# Patient Record
Sex: Male | Born: 1965 | Race: White | Hispanic: No | Smoking: Current some day smoker
Health system: Southern US, Community
[De-identification: ages and names within clinical notes are randomized; demographics above are authoritative.]

## PROBLEM LIST (undated history)

## (undated) DIAGNOSIS — K3189 Other diseases of stomach and duodenum: Secondary | ICD-10-CM

## (undated) DIAGNOSIS — R569 Unspecified convulsions: Secondary | ICD-10-CM

## (undated) DIAGNOSIS — R519 Headache, unspecified: Secondary | ICD-10-CM

## (undated) DIAGNOSIS — G43909 Migraine, unspecified, not intractable, without status migrainosus: Secondary | ICD-10-CM

## (undated) DIAGNOSIS — K766 Portal hypertension: Secondary | ICD-10-CM

## (undated) DIAGNOSIS — R51 Headache: Secondary | ICD-10-CM

## (undated) DIAGNOSIS — F419 Anxiety disorder, unspecified: Secondary | ICD-10-CM

## (undated) DIAGNOSIS — F32A Depression, unspecified: Secondary | ICD-10-CM

## (undated) DIAGNOSIS — K219 Gastro-esophageal reflux disease without esophagitis: Secondary | ICD-10-CM

## (undated) DIAGNOSIS — F329 Major depressive disorder, single episode, unspecified: Secondary | ICD-10-CM

## (undated) DIAGNOSIS — M1990 Unspecified osteoarthritis, unspecified site: Secondary | ICD-10-CM

## (undated) DIAGNOSIS — K7030 Alcoholic cirrhosis of liver without ascites: Secondary | ICD-10-CM

## (undated) DIAGNOSIS — F1010 Alcohol abuse, uncomplicated: Secondary | ICD-10-CM

## (undated) DIAGNOSIS — J439 Emphysema, unspecified: Secondary | ICD-10-CM

## (undated) DIAGNOSIS — J189 Pneumonia, unspecified organism: Secondary | ICD-10-CM

## (undated) DIAGNOSIS — J301 Allergic rhinitis due to pollen: Secondary | ICD-10-CM

## (undated) DIAGNOSIS — J449 Chronic obstructive pulmonary disease, unspecified: Secondary | ICD-10-CM

## (undated) DIAGNOSIS — K589 Irritable bowel syndrome without diarrhea: Secondary | ICD-10-CM

## (undated) DIAGNOSIS — F410 Panic disorder [episodic paroxysmal anxiety] without agoraphobia: Secondary | ICD-10-CM

## (undated) DIAGNOSIS — I639 Cerebral infarction, unspecified: Secondary | ICD-10-CM

## (undated) DIAGNOSIS — B1910 Unspecified viral hepatitis B without hepatic coma: Secondary | ICD-10-CM

## (undated) HISTORY — DX: Chronic obstructive pulmonary disease, unspecified: J44.9

## (undated) HISTORY — DX: Irritable bowel syndrome, unspecified: K58.9

## (undated) HISTORY — DX: Unspecified viral hepatitis B without hepatic coma: B19.10

## (undated) HISTORY — DX: Headache: R51

## (undated) HISTORY — DX: Headache, unspecified: R51.9

## (undated) HISTORY — DX: Portal hypertension: K76.6

## (undated) HISTORY — DX: Emphysema, unspecified: J43.9

## (undated) HISTORY — PX: LUMBAR DISC SURGERY: SHX700

## (undated) HISTORY — PX: ANTERIOR CERVICAL DECOMP/DISCECTOMY FUSION: SHX1161

## (undated) HISTORY — DX: Unspecified osteoarthritis, unspecified site: M19.90

## (undated) HISTORY — DX: Allergic rhinitis due to pollen: J30.1

## (undated) HISTORY — DX: Cerebral infarction, unspecified: I63.9

## (undated) HISTORY — DX: Other diseases of stomach and duodenum: K31.89

---

## 2006-09-22 ENCOUNTER — Ambulatory Visit (HOSPITAL_COMMUNITY): Admission: RE | Admit: 2006-09-22 | Discharge: 2006-09-23 | Payer: Self-pay | Admitting: Neurosurgery

## 2007-02-15 ENCOUNTER — Encounter: Admission: RE | Admit: 2007-02-15 | Discharge: 2007-02-15 | Payer: Self-pay | Admitting: Family Medicine

## 2008-07-31 ENCOUNTER — Encounter: Admission: RE | Admit: 2008-07-31 | Discharge: 2008-07-31 | Payer: Self-pay | Admitting: Family Medicine

## 2010-08-12 NOTE — Op Note (Signed)
NAMEJERIMY, JOHANSON               ACCOUNT NO.:  [id number]   MEDICAL RECORD NO.:  [id number]          PATIENT TYPE:  OIB   LOCATION:  3172                         FACILITY:  [facility]   PHYSICIAN:  Coletta Memos, M.D.     DATE OF BIRTH:  [birth year]   DATE OF PROCEDURE:  09/22/2006  DATE OF DISCHARGE:                               OPERATIVE REPORT   PREOPERATIVE DIAGNOSIS:  1. Cervical spondylosis with myelopathy.  2. Cervical stenosis.  3. Cervical radiculopathy C5-C6 and C6-C7.   POSTOPERATIVE DIAGNOSIS:  1. Cervical spondylosis with myelopathy.  2. Cervical stenosis.  3. Cervical radiculopathy C5-C6 and C6-C7.   PROCEDURE:  1. Anterior cervical decompression C5-C6 and C6-C7.  2. Anterior instrumentation C5 to C7.  3. Arthrodesis C5-C6 with a 6 mm allograft, C6-C7 with a 7 mm parallel      allograft.   COMPLICATIONS:  None.   SURGEON:  Coletta Memos, M.D.   ASSISTANT:  Stefani Dama, M.D.   A vector plate was used, 32 mm with 14 mm screws.   INDICATIONS:  Mr. Thall is a gentleman who was sent to me for  evaluation of neck pain.  MRI showed severe stenosis at C5-C6 with  abnormal cord signal and stenosis at C6-C7.  I recommended and he agreed  to undergo the procedure.  [employer] finally approved his case  after two months.  He is taken to the operating room today for  decompression and arthrodesis.   OPERATIVE NOTE:  Mr. Jolliff was brought to the operating room,  intubated and placed under a general anesthetic without difficulty.  His  neck was prepped and he was draped in a sterile fashion. I infiltrated 6  mL 0.5% lidocaine with 1:2100,000 epinephrine into the cervical region  starting from the midline extending to the medial border of the left  sternocleidomastoid at the level of the cricoid cartilage.  I opened the  skin with a #10 blade and I took this down sharply to the platysma.  I  opened the platysma in a horizontal fashion after dissecting rostrally  and caudally in the plane superior to the platysma.  After opening the  platysma, I dissected rostrally and caudally in a plane inferior to the  platysma.  I then identified some veins which I coagulated and divided  sharply.  I retracted the strap muscles medially along the esophagus and  the sternocleidomastoid muscle and carotid artery laterally.  I placed a  spinal needle and showed that I was at C5-C6.  I then reflected the  longus colli muscles bilaterally from C5 to C7 and placed self-retaining  retractors.  I opened the disc spaces at C5-C6 and C6-C7 using curets,  removed disc material with pituitary rongeurs.  I did this until I was  ready to bring in the microscope.   I brought the microscope into position, placed distraction pins at C5  and at C6, distracted the disc space, and proceeded with my  decompression.  I used a high speed drill along with curets, Kerrison  punches, and pituitary rongeurs, to remove both osteophytes and disc  material.  I exposed the posterior longitudinal ligament.  That was  removed using Kerrison punches to fully decompress the thecal sac.  I  then fully decompressed both C6 nerve roots using Kerrison punches and a  high speed drill.  The osteophytes were large and substantial  bilaterally.  I then used a barrel shaped bur to remove the endplate and  prepare for the arthrodesis.   I placed a 6 mm graft into the disc space after the full decompression.  I then used Gelfoam to control some minor bleeding and to achieve  hemostasis.  I then turned my attention to the C6-C7 disc space.  I  removed the distraction pin from C5 and placed it at C7.  I distracted  the disc space and then proceeded with the decompression.  I  decompressed the C6-C7 disc space and both C7 nerve roots using a high  speed drill, Kerrison punches, pituitary rongeurs and curets.  Again,  fairly large osteophytes were removed using the drill and Kerrison  punches until a full  decompression of the thecal sac and C7 nerve roots  bilaterally was obtained.  I then irrigated.  I then sized the disc  space and felt that a 7 mm graft would be the correct choice.   I drilled down the endplates and smoothed them out so that they would  accept a 7 mm graft well.  I placed the graft and used Gelfoam for  hemostasis.  I then prepared for anterior instrumentation.  I placed a  32 mm Vector plate anteriorly with two screws in C5, two in C6, two in  C7, first by drilling and then using self-tapping screws.  X-ray showed  the plate and screws, at least at C5-C6 level, were in good position.  All screws were locked into the plate locking mechanism.  I then  irrigated the wound.  With Dr. Verlee Rossetti assistance, we closed the wound.  Dr. Danielle Dess also assisted with the plate placement.  The wound was closed  in layered fashion using Vicryl sutures.  Dermabond was used for a  sterile dressing.  The platysma was reapproximated and subcuticular  layer.  Mr. Gear tolerated the procedure well.           ______________________________  Coletta Memos, M.D.     KC/MEDQ  D:  09/22/2006  T:  09/22/2006  Job:  [id number]

## 2011-01-14 LAB — CBC
Hemoglobin: 15.9
MCHC: 33.8
MCV: 88.3
RBC: 5.31

## 2011-01-14 LAB — BASIC METABOLIC PANEL
BUN: 13
Creatinine, Ser: 1.15
GFR calc Af Amer: >60
Sodium: 139

## 2012-03-18 ENCOUNTER — Other Ambulatory Visit: Payer: Self-pay | Admitting: Family Medicine

## 2012-03-24 ENCOUNTER — Ambulatory Visit
Admission: RE | Admit: 2012-03-24 | Discharge: 2012-03-24 | Disposition: A | Discharge status: Home / Self Care | Payer: PRIVATE HEALTH INSURANCE | Source: Ambulatory Visit | Attending: Family Medicine | Admitting: Family Medicine

## 2016-12-28 DIAGNOSIS — K7030 Alcoholic cirrhosis of liver without ascites: Secondary | ICD-10-CM

## 2016-12-28 HISTORY — DX: Alcoholic cirrhosis of liver without ascites: K70.30

## 2017-01-17 ENCOUNTER — Emergency Department (HOSPITAL_COMMUNITY): Payer: Self-pay

## 2017-01-17 ENCOUNTER — Encounter (HOSPITAL_COMMUNITY): Payer: Self-pay | Admitting: Internal Medicine

## 2017-01-17 ENCOUNTER — Inpatient Hospital Stay (HOSPITAL_COMMUNITY): Payer: Self-pay

## 2017-01-17 ENCOUNTER — Inpatient Hospital Stay (HOSPITAL_COMMUNITY)
Admission: EM | Admit: 2017-01-17 | Discharge: 2017-01-20 | DRG: 101 | Disposition: A | Discharge status: Home / Self Care | Payer: Self-pay | Attending: Internal Medicine | Admitting: Internal Medicine

## 2017-01-17 DIAGNOSIS — Z91128 Patient's intentional underdosing of medication regimen for other reason: Secondary | ICD-10-CM

## 2017-01-17 DIAGNOSIS — K769 Liver disease, unspecified: Secondary | ICD-10-CM

## 2017-01-17 DIAGNOSIS — F10239 Alcohol dependence with withdrawal, unspecified: Secondary | ICD-10-CM | POA: Diagnosis present

## 2017-01-17 DIAGNOSIS — K7460 Unspecified cirrhosis of liver: Secondary | ICD-10-CM

## 2017-01-17 DIAGNOSIS — Z23 Encounter for immunization: Secondary | ICD-10-CM

## 2017-01-17 DIAGNOSIS — E860 Dehydration: Secondary | ICD-10-CM | POA: Diagnosis present

## 2017-01-17 DIAGNOSIS — R569 Unspecified convulsions: Secondary | ICD-10-CM

## 2017-01-17 DIAGNOSIS — D696 Thrombocytopenia, unspecified: Secondary | ICD-10-CM

## 2017-01-17 DIAGNOSIS — W1830XA Fall on same level, unspecified, initial encounter: Secondary | ICD-10-CM | POA: Diagnosis present

## 2017-01-17 DIAGNOSIS — F10230 Alcohol dependence with withdrawal, uncomplicated: Secondary | ICD-10-CM

## 2017-01-17 DIAGNOSIS — K7030 Alcoholic cirrhosis of liver without ascites: Secondary | ICD-10-CM | POA: Diagnosis present

## 2017-01-17 DIAGNOSIS — R55 Syncope and collapse: Secondary | ICD-10-CM

## 2017-01-17 DIAGNOSIS — N179 Acute kidney failure, unspecified: Secondary | ICD-10-CM

## 2017-01-17 DIAGNOSIS — F10939 Alcohol use, unspecified with withdrawal, unspecified: Secondary | ICD-10-CM

## 2017-01-17 DIAGNOSIS — E876 Hypokalemia: Secondary | ICD-10-CM | POA: Diagnosis present

## 2017-01-17 DIAGNOSIS — G4089 Other seizures: Principal | ICD-10-CM | POA: Diagnosis present

## 2017-01-17 DIAGNOSIS — D6959 Other secondary thrombocytopenia: Secondary | ICD-10-CM | POA: Diagnosis present

## 2017-01-17 DIAGNOSIS — T424X6A Underdosing of benzodiazepines, initial encounter: Secondary | ICD-10-CM | POA: Diagnosis present

## 2017-01-17 DIAGNOSIS — K7010 Alcoholic hepatitis without ascites: Secondary | ICD-10-CM | POA: Diagnosis present

## 2017-01-17 DIAGNOSIS — F419 Anxiety disorder, unspecified: Secondary | ICD-10-CM | POA: Diagnosis present

## 2017-01-17 HISTORY — DX: Panic disorder (episodic paroxysmal anxiety): F41.0

## 2017-01-17 HISTORY — DX: Anxiety disorder, unspecified: F41.9

## 2017-01-17 HISTORY — DX: Alcohol abuse, uncomplicated: F10.10

## 2017-01-17 HISTORY — DX: Pneumonia, unspecified organism: J18.9

## 2017-01-17 HISTORY — DX: Unspecified convulsions: R56.9

## 2017-01-17 HISTORY — DX: Depression, unspecified: F32.A

## 2017-01-17 HISTORY — DX: Migraine, unspecified, not intractable, without status migrainosus: G43.909

## 2017-01-17 HISTORY — DX: Major depressive disorder, single episode, unspecified: F32.9

## 2017-01-17 HISTORY — DX: Gastro-esophageal reflux disease without esophagitis: K21.9

## 2017-01-17 LAB — CBG MONITORING, ED: GLUCOSE-CAPILLARY: 121 mg/dL — AB (ref 65–99)

## 2017-01-17 LAB — ETHANOL: Alcohol, Ethyl (B): <10 mg/dL (ref <10)

## 2017-01-17 LAB — COMPREHENSIVE METABOLIC PANEL
ALT: 36 U/L (ref 17–63)
AST: 111 U/L — ABNORMAL HIGH (ref 15–41)
Albumin: 3.4 g/dL — ABNORMAL LOW (ref 3.5–5.0)
Alkaline Phosphatase: 370 U/L — ABNORMAL HIGH (ref 38–126)
Anion gap: 9 (ref 5–15)
BILIRUBIN TOTAL: 5 mg/dL — AB (ref 0.3–1.2)
BUN: 6 mg/dL (ref 6–20)
CHLORIDE: 101 mmol/L (ref 101–111)
CO2: 21 mmol/L — ABNORMAL LOW (ref 22–32)
CREATININE: 1.01 mg/dL (ref 0.61–1.24)
Calcium: 8.7 mg/dL — ABNORMAL LOW (ref 8.9–10.3)
GLUCOSE: 106 mg/dL — AB (ref 65–99)
POTASSIUM: 3.6 mmol/L (ref 3.5–5.1)
Sodium: 131 mmol/L — ABNORMAL LOW (ref 135–145)
TOTAL PROTEIN: 8.1 g/dL (ref 6.5–8.1)

## 2017-01-17 LAB — CBC
HEMATOCRIT: 45.4 % (ref 39.0–52.0)
Hemoglobin: 16.5 g/dL (ref 13.0–17.0)
MCH: 37.8 pg — AB (ref 26.0–34.0)
MCHC: 36.3 g/dL — AB (ref 30.0–36.0)
MCV: 104.1 fL — AB (ref 78.0–100.0)
PLATELETS: 87 10*3/uL — AB (ref 150–400)
RBC: 4.36 MIL/uL (ref 4.22–5.81)
RDW: 16 % — AB (ref 11.5–15.5)
WBC: 10.5 10*3/uL (ref 4.0–10.5)

## 2017-01-17 LAB — TROPONIN I

## 2017-01-17 MED ORDER — ONDANSETRON HCL 4 MG/2ML IJ SOLN: 4.0000 mg | Freq: Four times a day (QID) | INTRAMUSCULAR | Status: DC | PRN

## 2017-01-17 MED ORDER — LORAZEPAM 2 MG/ML IJ SOLN: 0.0000 mg | Freq: Two times a day (BID) | INTRAMUSCULAR | Status: DC

## 2017-01-17 MED ORDER — LORAZEPAM 1 MG PO TABS
1.0000 mg | ORAL_TABLET | Freq: Four times a day (QID) | ORAL | Status: DC | PRN
  Administered 2017-01-18: 1 mg via ORAL
  Filled 2017-01-17 (×2): qty 1

## 2017-01-17 MED ORDER — LORAZEPAM 2 MG/ML IJ SOLN: 1.0000 mg | Freq: Four times a day (QID) | INTRAMUSCULAR | Status: DC | PRN

## 2017-01-17 MED ORDER — THIAMINE HCL 100 MG/ML IJ SOLN
100.0000 mg | Freq: Every day | INTRAMUSCULAR | Status: DC
  Filled 2017-01-17: qty 2

## 2017-01-17 MED ORDER — LORAZEPAM 2 MG/ML IJ SOLN
0.0000 mg | Freq: Four times a day (QID) | INTRAMUSCULAR | Status: DC
  Administered 2017-01-18 – 2017-01-19 (×2): 1 mg via INTRAVENOUS
  Administered 2017-01-19: 2 mg via INTRAVENOUS
  Administered 2017-01-19: 1 mg via INTRAVENOUS
  Filled 2017-01-17 (×4): qty 1

## 2017-01-17 MED ORDER — ACETAMINOPHEN 325 MG PO TABS
650.0000 mg | ORAL_TABLET | Freq: Four times a day (QID) | ORAL | Status: DC | PRN
  Administered 2017-01-18 – 2017-01-20 (×5): 650 mg via ORAL
  Filled 2017-01-17 (×5): qty 2

## 2017-01-17 MED ORDER — FOLIC ACID 1 MG PO TABS
1.0000 mg | ORAL_TABLET | Freq: Every day | ORAL | Status: DC
  Administered 2017-01-17 – 2017-01-20 (×4): 1 mg via ORAL
  Filled 2017-01-17 (×4): qty 1

## 2017-01-17 MED ORDER — LORAZEPAM 2 MG/ML IJ SOLN
1.0000 mg | Freq: Once | INTRAMUSCULAR | Status: AC
  Administered 2017-01-17: 1 mg via INTRAVENOUS
  Filled 2017-01-17: qty 1

## 2017-01-17 MED ORDER — ONDANSETRON HCL 4 MG PO TABS: 4.0000 mg | ORAL_TABLET | Freq: Four times a day (QID) | ORAL | Status: DC | PRN

## 2017-01-17 MED ORDER — VITAMIN B-1 100 MG PO TABS
100.0000 mg | ORAL_TABLET | Freq: Every day | ORAL | Status: DC
  Administered 2017-01-17 – 2017-01-20 (×4): 100 mg via ORAL
  Filled 2017-01-17 (×4): qty 1

## 2017-01-17 MED ORDER — ADULT MULTIVITAMIN W/MINERALS CH
1.0000 | ORAL_TABLET | Freq: Every day | ORAL | Status: DC
  Administered 2017-01-17 – 2017-01-20 (×4): 1 via ORAL
  Filled 2017-01-17 (×5): qty 1

## 2017-01-17 MED ORDER — ACETAMINOPHEN 650 MG RE SUPP: 650.0000 mg | Freq: Four times a day (QID) | RECTAL | Status: DC | PRN

## 2017-01-17 NOTE — ED Triage Notes (Signed)
Pt arrives EMS from home with c/o seizure today witnessed son. New onset. Arrives A&O x 4. MAEW [place] recently stopped clonazepam.

## 2017-01-17 NOTE — Progress Notes (Signed)
NURSING PROGRESS NOTE  Michael Mcfarland [id number] Admission Data: 01/17/2017 11:55 PM Attending Provider: Etta Quill, DO QBH:ALPFXTK, No Pcp Per Code Status: FULL  Allergies:  Patient has no known allergies. Past Medical History:   has a past medical history of Anxiety. Past Surgical History:   has no past surgical history on file. Social History:   reports that he drinks alcohol.  Michael Mcfarland is a 51 y.o. male patient admitted from ED:   Last Documented Vital Signs: Blood pressure 124/78, pulse 98, temperature 98.5 F (36.9 C), temperature source Oral, resp. rate 18, height 6' (1.829 m), weight 103.6 kg (228 lb 6.4 oz), SpO2 97 %.  Cardiac Monitoring: Box # 13 in place. Cardiac monitor yields:normal sinus rhythm.  IV Fluids:  IV in place, occlusive dsg intact without redness, IV cath hand left, condition patent and no redness none.   Skin: Intact and appropriate for ethnicity with small bruise to left posterior upper leg.  Patient orientated to room. Information packet given to patient. Admission inpatient armband information verified with patient to include name and date of birth and placed on patient arm. Side rails up x 2, fall assessment and education completed with patient. Patient able to verbalize understanding of risk associated with falls and verbalized understanding to call for assistance before getting out of bed. Call light within reach. Patient able to voice and demonstrate understanding of unit orientation instructions.    Will continue to evaluate and treat per MD orders.  Clydell Hakim RN, BSN

## 2017-01-17 NOTE — Progress Notes (Signed)
Received report from ED RN, Nicki Reaper.

## 2017-01-17 NOTE — ED Provider Notes (Signed)
[facility] EMERGENCY DEPARTMENT Provider Note   CSN: [id number] Arrival date & time: 01/17/17  1758     History   Chief Complaint Chief Complaint  Patient presents with  . Seizures    HPI Michael Mcfarland is a 51 y.o. male.  The history is provided by the patient. No language interpreter was used.  Seizures   This is a new problem. The problem has been resolved. There was 1 seizure. Associated symptoms include confusion. Pertinent negatives include no chest pain. Characteristics include rhythmic jerking and loss of consciousness. The episode was witnessed. There was no sensation of an aura present. The seizures did not continue in the ED. The seizure(s) had no focality. Possible causes include medication or dosage change. There has been no fever. There were no medications administered prior to arrival.  Pt's son reports pt had turned away from him and he suddenly put his arms up and yelled out.  Pt fell to the floor and had jerking motion.  Son reports pt began to arouse after about 6 minutes.  Pt reports he was anxious today before episode.  Pt reports he ran out of his Klonopin.  Pt reports his Physician Delia Chimes retired and he does not have a current MD.  No Medication in 4-5 days.  Pt admits to overuse of alcohol.  Pt reports he has been depressed since his wife died and he drinks to much.   No past medical history on file.  There are no active problems to display for this patient.   No past surgical history on file.     Home Medications    Prior to Admission medications   Not on File    Family History No family history on file.  Social History Social History  Substance Use Topics  . Smoking status: Not on file  . Smokeless tobacco: Not on file  . Alcohol use Not on file     Allergies   Patient has no known allergies.   Review of Systems Review of Systems  Constitutional: Negative for fever.  HENT: Negative for trouble swallowing.     Respiratory: Negative for wheezing.   Cardiovascular: Negative for chest pain.  Neurological: Positive for seizures and loss of consciousness.  Psychiatric/Behavioral: Positive for confusion.  All other systems reviewed and are negative.    Physical Exam Updated Vital Signs BP 128/90   Pulse (!) 108   Temp 98.3 F (36.8 C) (Oral)   Resp (!) 24   Ht 5\' 11"  (1.803 m)   Wt 95.3 kg (210 lb)   SpO2 93%   BMI 29.29 kg/m   Physical Exam  Constitutional: He appears well-developed and well-nourished.  HENT:  Head: Normocephalic and atraumatic.  Right Ear: External ear normal.  Left Ear: External ear normal.  Nose: Nose normal.  Mouth/Throat: Oropharynx is clear and moist.  Eyes: Conjunctivae are normal.  Neck: Neck supple.  Cardiovascular: Normal rate and regular rhythm.   No murmur heard. Pulmonary/Chest: Effort normal and breath sounds normal. No respiratory distress.  Abdominal: Soft. There is no tenderness.  Musculoskeletal: He exhibits no edema.  Neurological: He is alert.  Skin: Skin is warm and dry.  Psychiatric: He has a normal mood and affect.  Nursing note and vitals reviewed.    ED Treatments / Results  Labs (all labs ordered are listed, but only abnormal results are displayed) Labs Reviewed  CBC - Abnormal; Notable for the following:       Result  Value   MCV 104.1 (*)    MCH 37.8 (*)    MCHC 36.3 (*)    RDW 16.0 (*)    Platelets 87 (*)    All other components within normal limits  COMPREHENSIVE METABOLIC PANEL - Abnormal; Notable for the following:    Sodium 131 (*)    CO2 21 (*)    Glucose, Bld 106 (*)    Calcium 8.7 (*)    Albumin 3.4 (*)    AST 111 (*)    Alkaline Phosphatase 370 (*)    Total Bilirubin 5.0 (*)    All other components within normal limits  CBG MONITORING, ED - Abnormal; Notable for the following:    Glucose-Capillary 121 (*)    All other components within normal limits  TROPONIN I  ETHANOL  RAPID URINE DRUG SCREEN, HOSP  PERFORMED  URINALYSIS, ROUTINE W REFLEX MICROSCOPIC    EKG  EKG Interpretation None       Radiology Dg Chest 2 View  Result Date: 01/17/2017 CLINICAL DATA:  Witnessed seizure today. EXAM: CHEST  2 VIEW COMPARISON:  09/17/2006 FINDINGS: The heart size and mediastinal contours are within normal limits. Both lungs are clear. No shoulder dislocations. AC joint osteoarthritis with undersurface spurring bilaterally. ACDF of the lower cervical spine. IMPRESSION: No active cardiopulmonary disease. Electronically Signed   By: Ashley Royalty M.D.   On: 01/17/2017 20:50   Ct Head Wo Contrast  Result Date: 01/17/2017 CLINICAL DATA:  Witnessed seizure today. EXAM: CT HEAD WITHOUT CONTRAST TECHNIQUE: Contiguous axial images were obtained from the base of the skull through the vertex without intravenous contrast. COMPARISON:  02/15/2007 MRI FINDINGS: Brain: No evidence of acute infarction, hemorrhage, hydrocephalus, extra-axial collection or mass lesion/mass effect. Vascular: No hyperdense vessel or unexpected calcification. Atherosclerosis of the right vertebral artery. Minimal atherosclerosis of the carotid siphons bilaterally. Skull: Normal. Negative for fracture or focal lesion. Sinuses/Orbits: No acute finding. Mild ethmoid sinus mucosal thickening. Clear mastoids bilaterally. Other: None. IMPRESSION: No acute intracranial abnormality.  No skull fracture. Electronically Signed   By: Ashley Royalty M.D.   On: 01/17/2017 20:17    Procedures Procedures (including critical care time)  Medications Ordered in ED Medications  LORazepam (ATIVAN) injection 1 mg (1 mg Intravenous Given 01/17/17 1916)     Initial Impression / Assessment and Plan / ED Course  I have reviewed the triage vital signs and the nursing notes.  Pertinent labs & imaging results that were available during my care of the patient were reviewed by me and considered in my medical decision making (see chart for details).     Pt tearful  and anxious.  Pt given Iv ativan x 1 mg.  Pt has elevated lfts and bilirubin of 5.0.  Troponin negative.  I am suspicious of seizure second to benzo withdrawal but may still be syncope.  Pt continued on cardiac monitor. I will speak with Unassigned medicine to admit for further evaluation.   Final Clinical Impressions(s) / ED Diagnoses   Final diagnoses:  Seizure [facility]  Liver disease    New Prescriptions New Prescriptions   No medications on file     Sidney Ace 01/17/17 2145    Tegeler, Gwenyth Allegra, MD 01/18/17 [phone]

## 2017-01-17 NOTE — H&P (Signed)
History and Physical    Michael Mcfarland NTZ:[id number] DOB: 12/11/1965 DOA: 01/17/2017  PCP: Patient, No Pcp Per  Patient coming from: Home  I have personally briefly reviewed patient's old medical records in [place]  Chief Complaint: Seizures  HPI: Michael Mcfarland is a 50 y.o. male with medical history significant of Anxiety.  Patient presents to the ED after an apparent seizure episode at home.  Pt's son reports patient had turned away from him before he suddenly put his arms up and yelled out.  Fell to floor and had jerking motion.  Began to wake up after about 6 mins.  Of note patient ran out of his Klonopin several weeks ago as his physician retired and he doesn't have a current PCP.  He also admits to overuse of EtOH, depressed since wife died and drinks too much.   ED Course: Bili 5.0, platelets 80, AST 110.  Given Ativan 1mg .   Review of Systems: As per HPI otherwise 10 point review of systems negative.   Past Medical History:  Diagnosis Date  . Anxiety     History reviewed. No pertinent surgical history.   reports that he drinks alcohol. His tobacco and drug histories are not on file.  No Known Allergies  History reviewed. No pertinent family history.   Prior to Admission medications   Not on File    Physical Exam: Vitals:   01/17/17 2130 01/17/17 2145 01/17/17 2200 01/17/17 2215  BP:  128/88 126/89 122/76  Pulse: (!) 136 (!) 108 (!) 102 (!) 104  Resp:  19 16 14   Temp:      TempSrc:      SpO2: 97% 93% 94% 96%  Weight:      Height:        Constitutional: NAD, calm, comfortable Eyes: PERRL, lids and conjunctivae normal ENMT: Mucous membranes are moist. Posterior pharynx clear of any exudate or lesions.Normal dentition.  Neck: normal, supple, no masses, no thyromegaly Respiratory: clear to auscultation bilaterally, no wheezing, no crackles. Normal respiratory effort. No accessory muscle use.  Cardiovascular: Regular rate and rhythm, no murmurs /  rubs / gallops. No extremity edema. 2+ pedal pulses. No carotid bruits.  Abdomen: no tenderness, no masses palpated. No hepatosplenomegaly. Bowel sounds positive.  Musculoskeletal: no clubbing / cyanosis. No joint deformity upper and lower extremities. Good ROM, no contractures. Normal muscle tone.  Skin: no rashes, lesions, ulcers. No induration Neurologic: CN 2-12 grossly intact. Sensation intact, DTR normal. Strength 5/5 in all 4.  Psychiatric: Normal judgment and insight. Alert and oriented x 3. Normal mood.    Labs on Admission: I have personally reviewed following labs and imaging studies  CBC:  Recent Labs Lab 01/17/17 1859  WBC 10.5  HGB 16.5  HCT 45.4  MCV 104.1*  PLT 87*   Basic Metabolic Panel:  Recent Labs Lab 01/17/17 1859  NA 131*  K 3.6  CL 101  CO2 21*  GLUCOSE 106*  BUN 6  CREATININE 1.01  CALCIUM 8.7*   GFR: Estimated Creatinine Clearance: 99.7 mL/min (by C-G formula based on SCr of 1.01 mg/dL). Liver Function Tests:  Recent Labs Lab 01/17/17 1859  AST 111*  ALT 36  ALKPHOS 370*  BILITOT 5.0*  PROT 8.1  ALBUMIN 3.4*   No results for input(s): LIPASE, AMYLASE in the last 168 hours. No results for input(s): AMMONIA in the last 168 hours. Coagulation Profile: No results for input(s): INR, PROTIME in the last 168 hours. Cardiac Enzymes:  Recent Labs Lab 01/17/17 1859  TROPONINI <0.03   BNP (last 3 results) No results for input(s): PROBNP in the last 8760 hours. HbA1C: No results for input(s): HGBA1C in the last 72 hours. CBG:  Recent Labs Lab 01/17/17 1802  GLUCAP 121*   Lipid Profile: No results for input(s): CHOL, HDL, LDLCALC, TRIG, CHOLHDL, LDLDIRECT in the last 72 hours. Thyroid Function Tests: No results for input(s): TSH, T4TOTAL, FREET4, T3FREE, THYROIDAB in the last 72 hours. Anemia Panel: No results for input(s): VITAMINB12, FOLATE, FERRITIN, TIBC, IRON, RETICCTPCT in the last 72 hours. Urine analysis: No results  found for: COLORURINE, APPEARANCEUR, LABSPEC, PHURINE, GLUCOSEU, HGBUR, BILIRUBINUR, KETONESUR, PROTEINUR, UROBILINOGEN, NITRITE, LEUKOCYTESUR  Radiological Exams on Admission: Dg Chest 2 View  Result Date: 01/17/2017 CLINICAL DATA:  Witnessed seizure today. EXAM: CHEST  2 VIEW COMPARISON:  09/17/2006 FINDINGS: The heart size and mediastinal contours are within normal limits. Both lungs are clear. No shoulder dislocations. AC joint osteoarthritis with undersurface spurring bilaterally. ACDF of the lower cervical spine. IMPRESSION: No active cardiopulmonary disease. Electronically Signed   By: Ashley Royalty M.D.   On: 01/17/2017 20:50   Ct Head Wo Contrast  Result Date: 01/17/2017 CLINICAL DATA:  Witnessed seizure today. EXAM: CT HEAD WITHOUT CONTRAST TECHNIQUE: Contiguous axial images were obtained from the base of the skull through the vertex without intravenous contrast. COMPARISON:  02/15/2007 MRI FINDINGS: Brain: No evidence of acute infarction, hemorrhage, hydrocephalus, extra-axial collection or mass lesion/mass effect. Vascular: No hyperdense vessel or unexpected calcification. Atherosclerosis of the right vertebral artery. Minimal atherosclerosis of the carotid siphons bilaterally. Skull: Normal. Negative for fracture or focal lesion. Sinuses/Orbits: No acute finding. Mild ethmoid sinus mucosal thickening. Clear mastoids bilaterally. Other: None. IMPRESSION: No acute intracranial abnormality.  No skull fracture. Electronically Signed   By: Ashley Royalty M.D.   On: 01/17/2017 20:17    EKG: Independently reviewed.  Assessment/Plan Active Problems:   Alcohol withdrawal seizure ([place])    1. Seizure due to EtOH withdrawal and running out of benzos a couple of weeks ago - 1. CIWA 2. Ativan per CIWA 2. Liver picture suspicious for possible cirrhosis - 1. Including thrombocytopenia, bilirubin elevation 2. Repeat CMP in AM 3. RUQ [place] 4. Further work up based on results of above  DVT  prophylaxis: SCDs due to thrombocytopenia Code Status: Full Family Communication: No family in room Disposition Plan: home after admit Consults called: None Admission status: Admit to inpatient - several days of withdrawal treatment likely needed   Etta Quill DO [facility] Pager [phone]  If 7AM-7PM, please contact day team taking care of patient [email or web address] Password [email or web address]  01/17/2017, 10:37 PM

## 2017-01-18 ENCOUNTER — Inpatient Hospital Stay (HOSPITAL_COMMUNITY): Payer: Self-pay

## 2017-01-18 DIAGNOSIS — R55 Syncope and collapse: Secondary | ICD-10-CM

## 2017-01-18 DIAGNOSIS — D696 Thrombocytopenia, unspecified: Secondary | ICD-10-CM

## 2017-01-18 DIAGNOSIS — R569 Unspecified convulsions: Secondary | ICD-10-CM

## 2017-01-18 DIAGNOSIS — K7030 Alcoholic cirrhosis of liver without ascites: Secondary | ICD-10-CM

## 2017-01-18 DIAGNOSIS — K7460 Unspecified cirrhosis of liver: Secondary | ICD-10-CM

## 2017-01-18 DIAGNOSIS — F1010 Alcohol abuse, uncomplicated: Secondary | ICD-10-CM

## 2017-01-18 LAB — COMPREHENSIVE METABOLIC PANEL
ALT: 33 U/L (ref 17–63)
AST: 103 U/L — ABNORMAL HIGH (ref 15–41)
Albumin: 3.1 g/dL — ABNORMAL LOW (ref 3.5–5.0)
Alkaline Phosphatase: 338 U/L — ABNORMAL HIGH (ref 38–126)
Anion gap: 7 (ref 5–15)
BUN: 7 mg/dL (ref 6–20)
CALCIUM: 8.5 mg/dL — AB (ref 8.9–10.3)
CHLORIDE: 102 mmol/L (ref 101–111)
CO2: 23 mmol/L (ref 22–32)
CREATININE: 1.28 mg/dL — AB (ref 0.61–1.24)
Glucose, Bld: 139 mg/dL — ABNORMAL HIGH (ref 65–99)
Potassium: 3 mmol/L — ABNORMAL LOW (ref 3.5–5.1)
Sodium: 132 mmol/L — ABNORMAL LOW (ref 135–145)
TOTAL PROTEIN: 7.3 g/dL (ref 6.5–8.1)
Total Bilirubin: 4.9 mg/dL — ABNORMAL HIGH (ref 0.3–1.2)

## 2017-01-18 LAB — HIV ANTIBODY (ROUTINE TESTING W REFLEX): HIV SCREEN 4TH GENERATION: NONREACTIVE

## 2017-01-18 MED ORDER — POTASSIUM CHLORIDE CRYS ER 20 MEQ PO TBCR
40.0000 meq | EXTENDED_RELEASE_TABLET | ORAL | Status: AC
  Administered 2017-01-18 (×2): 40 meq via ORAL
  Filled 2017-01-18 (×2): qty 2

## 2017-01-18 MED ORDER — LACTATED RINGERS IV SOLN
INTRAVENOUS | Status: DC
  Administered 2017-01-18 – 2017-01-19 (×2): via INTRAVENOUS

## 2017-01-18 NOTE — Progress Notes (Signed)
EEG complete - results pending 

## 2017-01-18 NOTE — Procedures (Signed)
ELECTROENCEPHALOGRAM REPORT  Date of Study: 01/18/2017  Patient's Name: Michael Mcfarland MRN: [id number] Date of Birth: 1965-06-05  Referring Provider: Dr. Chipper Oman  Clinical History: This is a 51 year old man s/p new onset seizure.  Medications: acetaminophen (TYLENOL) tablet 615 mg  folic acid (FOLVITE) tablet 1 mg  multivitamin with minerals tablet 1 tablet  thiamine (VITAMIN B-1) tablet 100 mg   Technical Summary: A multichannel digital EEG recording measured by the international 10-20 system with electrodes applied with paste and impedances below 5000 ohms performed in our laboratory with EKG monitoring in an awake and drowsy patient.  Hyperventilation was not performed. Photic stimulation was performed.  The digital EEG was referentially recorded, reformatted, and digitally filtered in a variety of bipolar and referential montages for optimal display.    Description: The patient is awake and drowsy during the recording.  During maximal wakefulness, there is a symmetric, medium voltage 8 Hz posterior dominant rhythm that attenuates with eye opening.  The record is symmetric.  During drowsiness, there is an increase in theta slowing of the background.  Deeper stages of sleep were not seen. Photic stimulation did not elicit any abnormalities.  There were no epileptiform discharges or electrographic seizures seen.    EKG lead was unremarkable.  Impression: This awake and drowsy EEG is normal.    Clinical Correlation: A normal EEG does not exclude a clinical diagnosis of epilepsy. Clinical correlation is advised.   Ellouise Newer, M.D.

## 2017-01-18 NOTE — Progress Notes (Signed)
PROGRESS NOTE    Michael Mcfarland  JKD:[id number] DOB: 1965-10-22 DOA: 01/17/2017 PCP: Patient, No Pcp Per  Outpatient Specialists:     Brief Narrative:  Patient is a 51 year old male who presented s/p seizure at home.  Patient was arguing with son and reportedly fell to the ground, shaking for about 6 min.  Patient states he has never had a seizure before.  PMHx significant for anxiety and self-reported alcohol abuse.  Patient notes he has been depressed since his wife passed - he was her caretaker for 10 yrs - and ran out of his chronic Klonopin "a few weeks ago".  Tox screen negative.  AST 111, ALT 36, and Alk Phos 370.  RUQ US showed cholelithiasis and a cirrhotic appearing liver.  EKG showed sinus tachycardia.  CXR, head CT, and EEG negative.  Current CIWA score of 1.   Assessment & Plan:   Active Problems:   Alcohol withdrawal seizure ([place])   Syncopal episode -Patient afebrile, non-tremulous, denies hallucination/delusions.  Does not clinically correlate with alcohol or benzodiazepine withdrawal.  Last known BZD dose over two weeks ago with questionable self-taper due to running out of prescription.   -EEG 10/22 negative -Creatinine 1.28 today.  Continue IV fluids and trend. -Continue lorazepam and B vitamins supplementation -ECHO to work up syncopal episode as CXR, head CT, and EEG negative. -Continue to monitor closely  Cirrhosis secondary to alcohol abuse -Patient meets criteria for alcoholic hepatitis, if INR elevated, as he is mildy icteric, has elevated LFTs on 10/21 with AST:ALT ratio > 2, elevated serum bilirubin.  Self-reported h/o alcohol abuse, and low plt count.   -RUQ US showed cholelithiasis and a cirrhotic appearing liver -Obtain hepatitis panel and PT/INR.    Thrombocytopenia secondary to alcohol abuse -Platelets 87 on 10/21.  Avoid anticoagulates.  Continue to monitor  Hypokalemia -Potassium 3.1 last night.  Give supplemental K.  Continue to  monitor    DVT prophylaxis: SCDs Code Status: Full Family Communication: Spoke with patient Disposition Plan: Discharge home, expected tomorrow (10/22)   Consultants:   None  Procedures:   EEG to be done today  Antimicrobials:   None    Subjective: Patient states he is doing well today.  Eating breakfast during interview.  Denies headache, dizziness, visual changes, agitation, tremors, nausea, vomiting, abdominal pain.  Able to urinate and pass stool without difficulty, no blood.    Objective: Vitals:   01/17/17 2215 01/17/17 2321 01/17/17 2336 01/18/17 0545  BP: 122/76  124/78 117/82  Pulse: (!) 104  98 94  Resp: 14  18 18   Temp:   98.5 F (36.9 C) 98 F (36.7 C)  TempSrc:   Oral Oral  SpO2: 96%  97% 96%  Weight:   103.6 kg (228 lb 6.4 oz)   Height:  6' (1.829 m)      Intake/Output Summary (Last 24 hours) at 01/18/17 1223 Last data filed at 01/18/17 0900  Gross per 24 hour  Intake                0 ml  Output                0 ml  Net                0 ml   Filed Weights   01/17/17 1800 01/17/17 2336  Weight: 95.3 kg (210 lb) 103.6 kg (228 lb 6.4 oz)    Examination:  General exam: Appears calm and comfortable  sitting in hospital bed.  No acute distress. HEENT: Mild scleral icterus.  Left lateral conjunctival injection.  Moist mucous membranes. Respiratory system: Diminished breath sounds, clear to auscultation bilaterally. Respiratory effort normal. Cardiovascular system: S1 & S2 heard, RRR. No JVD, murmurs, rubs, gallops or clicks. No pedal edema. Gastrointestinal system: Abdomen is obese, round, soft and nontender. No organomegaly or masses felt. Normal bowel sounds heard. Central nervous system: Alert and oriented. Cranial nerves II-XII grossly intact.  Rapid alternating movements intact.  No focal neurological deficits. Extremities: Symmetric 5 x 5 power. Skin: No rashes, lesions or ulcers Psychiatry: Judgement and insight appear normal. Mood & affect  appropriate.     Data Reviewed: I have personally reviewed following labs and imaging studies  CBC:  Recent Labs Lab 01/17/17 1859  WBC 10.5  HGB 16.5  HCT 45.4  MCV 104.1*  PLT 87*   Basic Metabolic Panel:  Recent Labs Lab 01/17/17 1859 01/18/17 0010  NA 131* 132*  K 3.6 3.0*  CL 101 102  CO2 21* 23  GLUCOSE 106* 139*  BUN 6 7  CREATININE 1.01 1.28*  CALCIUM 8.7* 8.5*   GFR: Estimated Creatinine Clearance: 83.1 mL/min (A) (by C-G formula based on SCr of 1.28 mg/dL (H)). Liver Function Tests:  Recent Labs Lab 01/17/17 1859 01/18/17 0010  AST 111* 103*  ALT 36 33  ALKPHOS 370* 338*  BILITOT 5.0* 4.9*  PROT 8.1 7.3  ALBUMIN 3.4* 3.1*   No results for input(s): LIPASE, AMYLASE in the last 168 hours. No results for input(s): AMMONIA in the last 168 hours. Coagulation Profile: No results for input(s): INR, PROTIME in the last 168 hours. Cardiac Enzymes:  Recent Labs Lab 01/17/17 1859  TROPONINI <0.03   BNP (last 3 results) No results for input(s): PROBNP in the last 8760 hours. HbA1C: No results for input(s): HGBA1C in the last 72 hours. CBG:  Recent Labs Lab 01/17/17 1802  GLUCAP 121*   Lipid Profile: No results for input(s): CHOL, HDL, LDLCALC, TRIG, CHOLHDL, LDLDIRECT in the last 72 hours. Thyroid Function Tests: No results for input(s): TSH, T4TOTAL, FREET4, T3FREE, THYROIDAB in the last 72 hours. Anemia Panel: No results for input(s): VITAMINB12, FOLATE, FERRITIN, TIBC, IRON, RETICCTPCT in the last 72 hours. Urine analysis: No results found for: COLORURINE, APPEARANCEUR, LABSPEC, PHURINE, GLUCOSEU, HGBUR, BILIRUBINUR, KETONESUR, PROTEINUR, UROBILINOGEN, NITRITE, LEUKOCYTESUR Sepsis Labs: @LABRCNTIP (procalcitonin:4,lacticidven:4)  )No results found for this or any previous visit (from the past 240 hour(s)).       Radiology Studies: Dg Chest 2 View  Result Date: 01/17/2017 CLINICAL DATA:  Witnessed seizure today. EXAM: CHEST  2  VIEW COMPARISON:  09/17/2006 FINDINGS: The heart size and mediastinal contours are within normal limits. Both lungs are clear. No shoulder dislocations. AC joint osteoarthritis with undersurface spurring bilaterally. ACDF of the lower cervical spine. IMPRESSION: No active cardiopulmonary disease. Electronically Signed   By: Ashley Royalty M.D.   On: 01/17/2017 20:50   Ct Head Wo Contrast  Result Date: 01/17/2017 CLINICAL DATA:  Witnessed seizure today. EXAM: CT HEAD WITHOUT CONTRAST TECHNIQUE: Contiguous axial images were obtained from the base of the skull through the vertex without intravenous contrast. COMPARISON:  02/15/2007 MRI FINDINGS: Brain: No evidence of acute infarction, hemorrhage, hydrocephalus, extra-axial collection or mass lesion/mass effect. Vascular: No hyperdense vessel or unexpected calcification. Atherosclerosis of the right vertebral artery. Minimal atherosclerosis of the carotid siphons bilaterally. Skull: Normal. Negative for fracture or focal lesion. Sinuses/Orbits: No acute finding. Mild ethmoid sinus mucosal thickening. Clear mastoids  bilaterally. Other: None. IMPRESSION: No acute intracranial abnormality.  No skull fracture. Electronically Signed   By: Ashley Royalty M.D.   On: 01/17/2017 20:17   US Abdomen Limited Ruq  Result Date: 01/18/2017 CLINICAL DATA:  Cirrhosis EXAM: ULTRASOUND ABDOMEN LIMITED RIGHT UPPER QUADRANT COMPARISON:  None FINDINGS: Gallbladder: Tiny dependent shadowing calculi in gallbladder. No gallbladder wall thickening, pericholecystic fluid, or sonographic Murphy sign. Common bile duct: Diameter: 5 mm diameter, normal Liver: Heterogeneous increased echogenicity of the liver with mildly nodular hepatic margins compatible with cirrhosis. No discrete hepatic mass identified. Portal vein is patent on color Doppler imaging with normal direction of blood flow towards the liver. No RIGHT upper quadrant free fluid. IMPRESSION: Cirrhotic appearing liver without gross  evidence of mass. Cholelithiasis. Electronically Signed   By: Lavonia Dana M.D.   On: 01/18/2017 09:47        Scheduled Meds: . folic acid  1 mg Oral Daily  . LORazepam  0-4 mg Intravenous Q6H   Followed by  . [START ON 01/20/2017] LORazepam  0-4 mg Intravenous Q12H  . multivitamin with minerals  1 tablet Oral Daily  . thiamine  100 mg Oral Daily   Continuous Infusions: . lactated ringers 75 mL/hr at 01/18/17 1046     LOS: 1 day    Tanzania Hall-Potvin, PA-S [facility] Pager [phone]  If 7PM-7AM, please contact night-coverage [email or web address] Password [facility] 01/18/2017, 12:23 PM

## 2017-01-19 ENCOUNTER — Inpatient Hospital Stay (HOSPITAL_COMMUNITY): Payer: Self-pay

## 2017-01-19 ENCOUNTER — Encounter (HOSPITAL_COMMUNITY): Payer: Self-pay

## 2017-01-19 DIAGNOSIS — N179 Acute kidney failure, unspecified: Secondary | ICD-10-CM

## 2017-01-19 DIAGNOSIS — R55 Syncope and collapse: Secondary | ICD-10-CM

## 2017-01-19 LAB — COMPREHENSIVE METABOLIC PANEL
ALBUMIN: 2.9 g/dL — AB (ref 3.5–5.0)
ALT: 28 U/L (ref 17–63)
ANION GAP: 11 (ref 5–15)
AST: 89 U/L — AB (ref 15–41)
Alkaline Phosphatase: 286 U/L — ABNORMAL HIGH (ref 38–126)
BUN: 16 mg/dL (ref 6–20)
CHLORIDE: 104 mmol/L (ref 101–111)
CO2: 21 mmol/L — AB (ref 22–32)
Calcium: 8.8 mg/dL — ABNORMAL LOW (ref 8.9–10.3)
Creatinine, Ser: 1.56 mg/dL — ABNORMAL HIGH (ref 0.61–1.24)
GFR calc Af Amer: 57 mL/min — ABNORMAL LOW (ref >60)
GFR calc non Af Amer: 49 mL/min — ABNORMAL LOW (ref >60)
GLUCOSE: 99 mg/dL (ref 65–99)
POTASSIUM: 3.4 mmol/L — AB (ref 3.5–5.1)
SODIUM: 136 mmol/L (ref 135–145)
Total Bilirubin: 4.6 mg/dL — ABNORMAL HIGH (ref 0.3–1.2)
Total Protein: 6.7 g/dL (ref 6.5–8.1)

## 2017-01-19 LAB — URINALYSIS, ROUTINE W REFLEX MICROSCOPIC
Bilirubin Urine: NEGATIVE
GLUCOSE, UA: NEGATIVE mg/dL
KETONES UR: NEGATIVE mg/dL
Leukocytes, UA: NEGATIVE
NITRITE: NEGATIVE
PH: 5 (ref 5.0–8.0)
Protein, ur: NEGATIVE mg/dL
SPECIFIC GRAVITY, URINE: 1.011 (ref 1.005–1.030)

## 2017-01-19 LAB — ECHOCARDIOGRAM COMPLETE
HEIGHTINCHES: 72 in
WEIGHTICAEL: 3654.4 [oz_av]

## 2017-01-19 LAB — CBC
HEMATOCRIT: 41.5 % (ref 39.0–52.0)
HEMOGLOBIN: 14.4 g/dL (ref 13.0–17.0)
MCH: 37.1 pg — AB (ref 26.0–34.0)
MCHC: 34.7 g/dL (ref 30.0–36.0)
MCV: 107 fL — AB (ref 78.0–100.0)
Platelets: 76 10*3/uL — ABNORMAL LOW (ref 150–400)
RBC: 3.88 MIL/uL — ABNORMAL LOW (ref 4.22–5.81)
RDW: 16.5 % — AB (ref 11.5–15.5)
WBC: 7.9 10*3/uL (ref 4.0–10.5)

## 2017-01-19 LAB — RAPID URINE DRUG SCREEN, HOSP PERFORMED
AMPHETAMINES: NOT DETECTED
BARBITURATES: NOT DETECTED
BENZODIAZEPINES: POSITIVE — AB
Cocaine: NOT DETECTED
Opiates: NOT DETECTED
TETRAHYDROCANNABINOL: NOT DETECTED

## 2017-01-19 LAB — OSMOLALITY, URINE: Osmolality, Ur: 434 mOsm/kg (ref 300–900)

## 2017-01-19 LAB — NA AND K (SODIUM & POTASSIUM), RAND UR
POTASSIUM UR: 23 mmol/L
Sodium, Ur: 78 mmol/L

## 2017-01-19 MED ORDER — ESCITALOPRAM OXALATE 10 MG PO TABS
10.0000 mg | ORAL_TABLET | Freq: Every day | ORAL | Status: DC
  Administered 2017-01-19: 10 mg via ORAL
  Filled 2017-01-19: qty 1

## 2017-01-19 MED ORDER — SODIUM CHLORIDE 0.9 % IV SOLN
INTRAVENOUS | Status: DC
  Administered 2017-01-19 (×2): via INTRAVENOUS

## 2017-01-19 MED ORDER — INFLUENZA VAC SPLIT QUAD 0.5 ML IM SUSY
0.5000 mL | PREFILLED_SYRINGE | INTRAMUSCULAR | Status: AC
  Administered 2017-01-20: 0.5 mL via INTRAMUSCULAR
  Filled 2017-01-19: qty 0.5

## 2017-01-19 NOTE — Progress Notes (Signed)
PROGRESS NOTE    ERNESTO ZUKOWSKI  OAC:[id number] DOB: Jul 29, 1965 DOA: 01/17/2017 PCP: Patient, No Pcp Per  Outpatient Specialists:     Brief Narrative:  Patient is a 51 year old male who presented s/p seizure at home.  Patient was arguing with son and reportedly fell to the ground, shaking for about 6 min.  Patient states he has never had a seizure before.  PMHx significant for anxiety and self-reported alcohol abuse.  Patient notes he has been depressed since his wife passed - he was her caretaker for 10 yrs - and ran out of his chronic Klonopin "a few weeks ago".  Tox screen negative.  AST 111, ALT 36, and Alk Phos 370.  RUQ US showed cholelithiasis and a cirrhotic appearing liver.  EKG showed sinus tachycardia.  CXR, head CT, and EEG negative.  Current CIWA score of 1.  ECHO today - EF 55-60%.  Cr increased to 1.56, BUN to 16 on 10/23.   Assessment & Plan:   Active Problems:   Alcohol withdrawal seizure ([place])   Seizure ([place])   Cirrhosis ([place])   Syncope   Alcohol abuse   Thrombocytopenia ([place])   Syncopal episode -Patient afebrile, non-tremulous, denies hallucination/delusions.  Does not clinically correlate with alcohol or benzodiazepine withdrawal.  Last known BZD dose over two weeks ago with questionable self-taper due to running out of prescription.   -EEG 10/22 negative -Continue lorazepam and B vitamins supplementation -ECHO 10/23 - EF 55-60% -Continue to monitor closely  Cirrhosis secondary to alcohol abuse -RUQ US showed cholelithiasis and a cirrhotic appearing liver -Patient meets criteria for alcoholic hepatitis, if INR elevated, as he is mildy icteric, has elevated LFTs on 10/21 with AST:ALT ratio > 2, elevated serum bilirubin.  Self-reported h/o alcohol abuse, and low plt count.   -Obtain hepatitis panel and PT/INR   Acute Kidney Injury -Creatinine 1.56 today, has been > 1 since admission.   -BUN 16 today (7 on 10/22) -Switch LR to NS.   -Urine Na, K, and  osmolality ordered.  If abnormal, consider renal US.    Thrombocytopenia secondary to alcohol abuse -Platelets 76 today.  Avoid anticoagulates.  Continue to monitor  Hypokalemia -Potassium 3.4 today.  Give supplemental K PO.   -Check mag level.   -Continue to monitor    DVT prophylaxis: SCDs Code Status: Full Family Communication: Spoke with patient Disposition Plan: Discharge home, expected tomorrow (10/22)   Consultants:   None  Procedures:   EEG to be done today  Antimicrobials:   None    Subjective: Patient states he is doing well today. Did not get much sleep last night due to "machines going off beeping all night". Denies chest pain, SOB, nausea, vomiting, abdominal pain. Able to ambulate to bathroom, no difficulties with urination/BMs  Objective: Vitals:   01/17/17 2336 01/18/17 0545 01/19/17 0117 01/19/17 0626  BP: 124/78 117/82 137/86 121/73  Pulse: 98 94 87 89  Resp: 18 18 18 18   Temp: 98.5 F (36.9 C) 98 F (36.7 C) 98.4 F (36.9 C) 98.1 F (36.7 C)  TempSrc: Oral Oral Oral Oral  SpO2: 97% 96% 97% 93%  Weight: 103.6 kg (228 lb 6.4 oz)     Height:        Intake/Output Summary (Last 24 hours) at 01/19/17 1111 Last data filed at 01/19/17 0749  Gross per 24 hour  Intake            317.5 ml  Output  275 ml  Net             42.5 ml   Filed Weights   01/17/17 1800 01/17/17 2336  Weight: 95.3 kg (210 lb) 103.6 kg (228 lb 6.4 oz)    Examination:  General exam: Appears calm and comfortable sitting in hospital bed.  No acute distress. HEENT: Mild scleral icterus.  Lateral conjunctival injection in left eye improved from yesterday.  Moist mucous membranes. Respiratory system: Diminished breath sounds, clear to auscultation bilaterally. Respiratory effort normal. Cardiovascular system: S1 & S2 heard, RRR. No JVD, murmurs, rubs, gallops or clicks. No pedal edema. Gastrointestinal system: Abdomen is obese, round, soft and nontender. No  organomegaly or masses felt. Normal bowel sounds heard.  Negative Murphy's sign. Central nervous system: Alert and oriented. Cranial nerves II-XII grossly intact.  No focal neurological deficits. Extremities: Symmetric 5 x 5 power. Skin: No rashes, lesions or ulcers Psychiatry: Judgement and insight appear normal. Mood & affect appropriate.     Data Reviewed: I have personally reviewed following labs and imaging studies  CBC:  Recent Labs Lab 01/17/17 1859 01/19/17 0602  WBC 10.5 7.9  HGB 16.5 14.4  HCT 45.4 41.5  MCV 104.1* 107.0*  PLT 87* 76*   Basic Metabolic Panel:  Recent Labs Lab 01/17/17 1859 01/18/17 0010 01/19/17 0602  NA 131* 132* 136  K 3.6 3.0* 3.4*  CL 101 102 104  CO2 21* 23 21*  GLUCOSE 106* 139* 99  BUN 6 7 16   CREATININE 1.01 1.28* 1.56*  CALCIUM 8.7* 8.5* 8.8*   GFR: Estimated Creatinine Clearance: 68.2 mL/min (A) (by C-G formula based on SCr of 1.56 mg/dL (H)). Liver Function Tests:  Recent Labs Lab 01/17/17 1859 01/18/17 0010 01/19/17 0602  AST 111* 103* 89*  ALT 36 33 28  ALKPHOS 370* 338* 286*  BILITOT 5.0* 4.9* 4.6*  PROT 8.1 7.3 6.7  ALBUMIN 3.4* 3.1* 2.9*   No results for input(s): LIPASE, AMYLASE in the last 168 hours. No results for input(s): AMMONIA in the last 168 hours. Coagulation Profile: No results for input(s): INR, PROTIME in the last 168 hours. Cardiac Enzymes:  Recent Labs Lab 01/17/17 1859  TROPONINI <0.03   BNP (last 3 results) No results for input(s): PROBNP in the last 8760 hours. HbA1C: No results for input(s): HGBA1C in the last 72 hours. CBG:  Recent Labs Lab 01/17/17 1802  GLUCAP 121*   Lipid Profile: No results for input(s): CHOL, HDL, LDLCALC, TRIG, CHOLHDL, LDLDIRECT in the last 72 hours. Thyroid Function Tests: No results for input(s): TSH, T4TOTAL, FREET4, T3FREE, THYROIDAB in the last 72 hours. Anemia Panel: No results for input(s): VITAMINB12, FOLATE, FERRITIN, TIBC, IRON, RETICCTPCT  in the last 72 hours. Urine analysis:    Component Value Date/Time   COLORURINE AMBER (A) 01/19/2017 0131   APPEARANCEUR CLEAR 01/19/2017 0131   LABSPEC 1.011 01/19/2017 0131   PHURINE 5.0 01/19/2017 0131   GLUCOSEU NEGATIVE 01/19/2017 0131   HGBUR SMALL (A) 01/19/2017 0131   BILIRUBINUR NEGATIVE 01/19/2017 0131   KETONESUR NEGATIVE 01/19/2017 0131   PROTEINUR NEGATIVE 01/19/2017 0131   NITRITE NEGATIVE 01/19/2017 0131   LEUKOCYTESUR NEGATIVE 01/19/2017 0131   Sepsis Labs: @LABRCNTIP (procalcitonin:4,lacticidven:4)  )No results found for this or any previous visit (from the past 240 hour(s)).       Radiology Studies: Dg Chest 2 View  Result Date: 01/17/2017 CLINICAL DATA:  Witnessed seizure today. EXAM: CHEST  2 VIEW COMPARISON:  09/17/2006 FINDINGS: The heart size and mediastinal contours  are within normal limits. Both lungs are clear. No shoulder dislocations. AC joint osteoarthritis with undersurface spurring bilaterally. ACDF of the lower cervical spine. IMPRESSION: No active cardiopulmonary disease. Electronically Signed   By: Ashley Royalty M.D.   On: 01/17/2017 20:50   Ct Head Wo Contrast  Result Date: 01/17/2017 CLINICAL DATA:  Witnessed seizure today. EXAM: CT HEAD WITHOUT CONTRAST TECHNIQUE: Contiguous axial images were obtained from the base of the skull through the vertex without intravenous contrast. COMPARISON:  02/15/2007 MRI FINDINGS: Brain: No evidence of acute infarction, hemorrhage, hydrocephalus, extra-axial collection or mass lesion/mass effect. Vascular: No hyperdense vessel or unexpected calcification. Atherosclerosis of the right vertebral artery. Minimal atherosclerosis of the carotid siphons bilaterally. Skull: Normal. Negative for fracture or focal lesion. Sinuses/Orbits: No acute finding. Mild ethmoid sinus mucosal thickening. Clear mastoids bilaterally. Other: None. IMPRESSION: No acute intracranial abnormality.  No skull fracture. Electronically Signed    By: Ashley Royalty M.D.   On: 01/17/2017 20:17   US Abdomen Limited Ruq  Result Date: 01/18/2017 CLINICAL DATA:  Cirrhosis EXAM: ULTRASOUND ABDOMEN LIMITED RIGHT UPPER QUADRANT COMPARISON:  None FINDINGS: Gallbladder: Tiny dependent shadowing calculi in gallbladder. No gallbladder wall thickening, pericholecystic fluid, or sonographic Murphy sign. Common bile duct: Diameter: 5 mm diameter, normal Liver: Heterogeneous increased echogenicity of the liver with mildly nodular hepatic margins compatible with cirrhosis. No discrete hepatic mass identified. Portal vein is patent on color Doppler imaging with normal direction of blood flow towards the liver. No RIGHT upper quadrant free fluid. IMPRESSION: Cirrhotic appearing liver without gross evidence of mass. Cholelithiasis. Electronically Signed   By: Lavonia Dana M.D.   On: 01/18/2017 09:47        Scheduled Meds: . folic acid  1 mg Oral Daily  . LORazepam  0-4 mg Intravenous Q6H   Followed by  . [START ON 01/20/2017] LORazepam  0-4 mg Intravenous Q12H  . multivitamin with minerals  1 tablet Oral Daily  . thiamine  100 mg Oral Daily   Continuous Infusions: . sodium chloride       LOS: 2 days    [place] Hall-Potvin, PA-S [facility] Pager [phone]  If 7PM-7AM, please contact night-coverage [email or web address] Password [email or web address] 01/19/2017, 11:11 AM

## 2017-01-19 NOTE — Progress Notes (Signed)
  Echocardiogram 2D Echocardiogram has been performed.  Michael Mcfarland 01/19/2017, 9:39 AM

## 2017-01-20 DIAGNOSIS — N179 Acute kidney failure, unspecified: Secondary | ICD-10-CM

## 2017-01-20 LAB — CBC WITH DIFFERENTIAL/PLATELET
BASOS ABS: 0.1 10*3/uL (ref 0.0–0.1)
BASOS PCT: 1 %
EOS ABS: 0.1 10*3/uL (ref 0.0–0.7)
EOS PCT: 2 %
HCT: 40.3 % (ref 39.0–52.0)
Hemoglobin: 14.3 g/dL (ref 13.0–17.0)
Lymphocytes Relative: 23 %
Lymphs Abs: 2 10*3/uL (ref 0.7–4.0)
MCH: 37.8 pg — ABNORMAL HIGH (ref 26.0–34.0)
MCHC: 35.5 g/dL (ref 30.0–36.0)
MCV: 106.6 fL — ABNORMAL HIGH (ref 78.0–100.0)
MONO ABS: 0.6 10*3/uL (ref 0.1–1.0)
Monocytes Relative: 7 %
Neutro Abs: 5.9 10*3/uL (ref 1.7–7.7)
Neutrophils Relative %: 68 %
PLATELETS: 81 10*3/uL — AB (ref 150–400)
RBC: 3.78 MIL/uL — AB (ref 4.22–5.81)
RDW: 16.1 % — AB (ref 11.5–15.5)
WBC: 8.7 10*3/uL (ref 4.0–10.5)

## 2017-01-20 LAB — PROTIME-INR
INR: 1.37
Prothrombin Time: 16.8 seconds — ABNORMAL HIGH (ref 11.4–15.2)

## 2017-01-20 LAB — COMPREHENSIVE METABOLIC PANEL
ALT: 29 U/L (ref 17–63)
AST: 91 U/L — AB (ref 15–41)
Albumin: 3 g/dL — ABNORMAL LOW (ref 3.5–5.0)
Alkaline Phosphatase: 296 U/L — ABNORMAL HIGH (ref 38–126)
Anion gap: 10 (ref 5–15)
BUN: 17 mg/dL (ref 6–20)
CHLORIDE: 109 mmol/L (ref 101–111)
CO2: 19 mmol/L — AB (ref 22–32)
CREATININE: 1.24 mg/dL (ref 0.61–1.24)
Calcium: 8.7 mg/dL — ABNORMAL LOW (ref 8.9–10.3)
GFR calc Af Amer: >60 mL/min (ref >60)
Glucose, Bld: 102 mg/dL — ABNORMAL HIGH (ref 65–99)
POTASSIUM: 3.8 mmol/L (ref 3.5–5.1)
SODIUM: 138 mmol/L (ref 135–145)
Total Bilirubin: 4.4 mg/dL — ABNORMAL HIGH (ref 0.3–1.2)
Total Protein: 6.9 g/dL (ref 6.5–8.1)

## 2017-01-20 MED ORDER — FLUOXETINE HCL 10 MG PO CAPS: 10.0000 mg | ORAL_CAPSULE | Freq: Every day | ORAL | 0 refills | Status: DC

## 2017-01-20 MED ORDER — HYDROXYZINE HCL 25 MG PO TABS: 25.0000 mg | ORAL_TABLET | Freq: Three times a day (TID) | ORAL | Status: DC | PRN

## 2017-01-20 MED ORDER — FOLIC ACID 1 MG PO TABS: 1.0000 mg | ORAL_TABLET | Freq: Every day | ORAL | 0 refills | Status: DC

## 2017-01-20 MED ORDER — FLUOXETINE HCL 10 MG PO CAPS: 10.0000 mg | ORAL_CAPSULE | Freq: Every day | ORAL | Status: DC

## 2017-01-20 MED ORDER — LOPERAMIDE HCL 2 MG PO CAPS: 2.0000 mg | ORAL_CAPSULE | Freq: Once | ORAL | Status: DC

## 2017-01-20 MED ORDER — LOPERAMIDE HCL 2 MG PO CAPS
2.0000 mg | ORAL_CAPSULE | ORAL | Status: AC | PRN
Start: 2017-01-20 — End: 2017-01-20
  Administered 2017-01-20: 2 mg via ORAL
  Filled 2017-01-20: qty 1

## 2017-01-20 MED ORDER — THIAMINE HCL 100 MG PO TABS: 100.0000 mg | ORAL_TABLET | Freq: Every day | ORAL | 0 refills | Status: DC

## 2017-01-20 MED ORDER — HYDROXYZINE HCL 25 MG PO TABS: 25.0000 mg | ORAL_TABLET | Freq: Three times a day (TID) | ORAL | 0 refills | Status: DC | PRN

## 2017-01-20 NOTE — Progress Notes (Signed)
Patient discharge teaching given, including activity, diet, follow-up appoints, and medications. Patient verbalized understanding of all discharge instructions. IV access was d/c'd. Vitals are stable. Skin is intact except as charted in most recent assessments. Pt to be escorted out by NT, to be driven home by family. 

## 2017-01-21 LAB — HEPATITIS PANEL, ACUTE
HEP A IGM: NEGATIVE
HEP B S AG: NEGATIVE
Hep B C IgM: NEGATIVE

## 2017-01-23 NOTE — Discharge Summary (Signed)
[facility] Discharge Summary   Patient: Michael Mcfarland DGU:[id number]   PCP: Patient, No Pcp Per DOB: 29-Feb-1966   Date of admission: 01/17/2017   Date of discharge: 01/20/2017   Discharge Diagnoses:  Active Problems:   Alcohol withdrawal seizure ([place])   Seizure ([place])   Cirrhosis ([place])   Syncope   Alcohol abuse   Thrombocytopenia ([place])   AKI (acute kidney injury) ([place])   Admitted From: Home Disposition: Home  Recommendations for Outpatient Follow-up:  1. Follow-up with PCP in 1-2 weeks  Follow-up [place]. Go on 02/10/2017.   Specialty:  Internal Medicine Why:  post hospital follow scheduled for 01/10/2017 at 1pm with Molli Barrows NP at [place] information: [place] [phone]         Diet recommendation: Regular diet  Activity: The patient is advised to gradually reintroduce usual activities.  Discharge Condition: good  Code Status: Full code  History of present illness: As per the H and P dictated on admission, "Michael Mcfarland is a 51 y.o. male with medical history significant of Anxiety.  Patient presents to the ED after an apparent seizure episode at home.  Pt's son reports patient had turned away from him before he suddenly put his arms up and yelled out.  Fell to floor and had jerking motion.  Began to wake up after about 6 mins.  Of note patient ran out of his Klonopin several weeks ago as his physician retired and he doesn't have a current PCP.  He also admits to overuse of EtOH, depressed since wife died and drinks too much."  Hospital Course:  Summary of his active problems in the hospital is as following. Syncopal episode- felt to be vasovagal in view of dehydration and agitation.  EEG negative UDS pending. ECHO no acute findings. EF 55-60%  CT head negative.  Patient is asymptomatic and ambulatory.  AKI Likely from  dehydration, treated with IVF,  Almost back to normal. Recommend oral hydration at home.  Cirrhosis secondary to alcohol abuse -  Outpatient follow-up with PCP, further workup and GI referral as needed.  Thrombocytopenia due to alcohol - Continue to monitor no signs of overt bleeding   Hypokalemia -resolved  Alcohol abuse -  ETOH cessation discussed   Anxiety - patient essentially taper her self from benzo, will start on Prozac and follow up with PCP.   All other chronic medical condition were stable during the hospitalization.  Patient was ambulatory without any assistance. On the day of the discharge the patient's [place], and no other acute medical condition were reported by patient. the patient was felt safe to be discharge at home with family.  Procedures and Results:  Echocardiogram    Consultations:  none  DISCHARGE MEDICATION: Discharge Medication List as of 01/20/2017  3:36 PM    START taking these medications   Details  FLUoxetine (PROZAC) 10 MG capsule Take 1 capsule (10 mg total) by mouth at bedtime., Starting Wed 32/95/1884, Normal    folic acid (FOLVITE) 1 MG tablet Take 1 tablet (1 mg total) by mouth daily., Starting Thu 01/21/2017, Normal    hydrOXYzine (ATARAX/VISTARIL) 25 MG tablet Take 1 tablet (25 mg total) by mouth 3 (three) times daily as needed for anxiety., Starting Wed 01/20/2017, Normal    thiamine 100 MG tablet Take 1 tablet (100 mg total) by mouth daily., Starting Thu 01/21/2017, Normal  No Known Allergies Discharge Instructions    Diet general    Complete by:  As directed    Discharge instructions    Complete by:  As directed    It is important that you read following instructions as well as go over your medication list with RN to help you understand your care after this hospitalization.  Discharge Instructions: Please follow-up with PCP in one week  Please request your primary care physician to go over all Hospital  Tests and Procedure/Radiological results at the follow up,  Please get all Hospital records sent to your PCP by signing hospital release before you go home.   Do not drive, operating heavy machinery, perform activities at heights, swimming or participation in water activities or provide baby sitting services; until you have been seen by Primary Care Physician or a Neurologist and advised to do so again. Do not take more than prescribed Pain, Sleep and Anxiety Medications. You were cared for by a hospitalist during your hospital stay. If you have any questions about your discharge medications or the care you received while you were in the hospital after you are discharged, you can call the unit and ask to speak with the hospitalist on call if the hospitalist that took care of you is not available.  Once you are discharged, your primary care physician will handle any further medical issues. Please note that NO REFILLS for any discharge medications will be authorized once you are discharged, as it is imperative that you return to your primary care physician (or establish a relationship with a primary care physician if you do not have one) for your aftercare needs so that they can reassess your need for medications and monitor your lab values. You Must read complete instructions/literature along with all the possible adverse reactions/side effects for all the Medicines you take and that have been prescribed to you. Take any new Medicines after you have completely understood and accept all the possible adverse reactions/side effects. Wear Seat belts while driving. If you have smoked or chewed Tobacco in the last 2 yrs please stop smoking and/or stop any Recreational drug use.   Increase activity slowly    Complete by:  As directed      Discharge Exam: Filed Weights   01/17/17 1800 01/17/17 2336  Weight: 95.3 kg (210 lb) 103.6 kg (228 lb 6.4 oz)   Vitals:   01/19/17 2159 01/20/17 0604  BP: 137/77 (!)  150/91  Pulse: 82 79  Resp: 20 20  Temp: 98.5 F (36.9 C) 98.5 F (36.9 C)  SpO2: 95% 97%   General: Appear in no distress, no Rash; Oral Mucosa moist. Cardiovascular: S1 and S2 Present, no Murmur, no JVD Respiratory: Bilateral Air entry present and Clear to Auscultation, no Crackles, no wheezes Abdomen: Bowel Sound present, Soft and no tenderness Extremities: no Pedal edema, no calf tenderness Neurology: Grossly no focal neuro deficit.  The results of significant diagnostics from this hospitalization (including imaging, microbiology, ancillary and laboratory) are listed below for reference.    Significant Diagnostic Studies: Dg Chest 2 View  Result Date: 01/17/2017 CLINICAL DATA:  Witnessed seizure today. EXAM: CHEST  2 VIEW COMPARISON:  09/17/2006 FINDINGS: The heart size and mediastinal contours are within normal limits. Both lungs are clear. No shoulder dislocations. AC joint osteoarthritis with undersurface spurring bilaterally. ACDF of the lower cervical spine. IMPRESSION: No active cardiopulmonary disease. Electronically Signed   By: Ashley Royalty M.D.   On: 01/17/2017 20:50   Ct  Head Wo Contrast  Result Date: 01/17/2017 CLINICAL DATA:  Witnessed seizure today. EXAM: CT HEAD WITHOUT CONTRAST TECHNIQUE: Contiguous axial images were obtained from the base of the skull through the vertex without intravenous contrast. COMPARISON:  02/15/2007 MRI FINDINGS: Brain: No evidence of acute infarction, hemorrhage, hydrocephalus, extra-axial collection or mass lesion/mass effect. Vascular: No hyperdense vessel or unexpected calcification. Atherosclerosis of the right vertebral artery. Minimal atherosclerosis of the carotid siphons bilaterally. Skull: Normal. Negative for fracture or focal lesion. Sinuses/Orbits: No acute finding. Mild ethmoid sinus mucosal thickening. Clear mastoids bilaterally. Other: None. IMPRESSION: No acute intracranial abnormality.  No skull fracture. Electronically Signed    By: Ashley Royalty M.D.   On: 01/17/2017 20:17   US Abdomen Limited Ruq  Result Date: 01/18/2017 CLINICAL DATA:  Cirrhosis EXAM: ULTRASOUND ABDOMEN LIMITED RIGHT UPPER QUADRANT COMPARISON:  None FINDINGS: Gallbladder: Tiny dependent shadowing calculi in gallbladder. No gallbladder wall thickening, pericholecystic fluid, or sonographic Murphy sign. Common bile duct: Diameter: 5 mm diameter, normal Liver: Heterogeneous increased echogenicity of the liver with mildly nodular hepatic margins compatible with cirrhosis. No discrete hepatic mass identified. Portal vein is patent on color Doppler imaging with normal direction of blood flow towards the liver. No RIGHT upper quadrant free fluid. IMPRESSION: Cirrhotic appearing liver without gross evidence of mass. Cholelithiasis. Electronically Signed   By: Lavonia Dana M.D.   On: 01/18/2017 09:47    Microbiology: No results found for this or any previous visit (from the past 240 hour(s)).   Labs: CBC:  Recent Labs Lab 01/17/17 1859 01/19/17 0602 01/20/17 0524  WBC 10.5 7.9 8.7  NEUTROABS  --   --  5.9  HGB 16.5 14.4 14.3  HCT 45.4 41.5 40.3  MCV 104.1* 107.0* 106.6*  PLT 87* 76* 81*   Basic Metabolic Panel:  Recent Labs Lab 01/17/17 1859 01/18/17 0010 01/19/17 0602 01/20/17 0524  NA 131* 132* 136 138  K 3.6 3.0* 3.4* 3.8  CL 101 102 104 109  CO2 21* 23 21* 19*  GLUCOSE 106* 139* 99 102*  BUN 6 7 16 17   CREATININE 1.01 1.28* 1.56* 1.24  CALCIUM 8.7* 8.5* 8.8* 8.7*   Liver Function Tests:  Recent Labs Lab 01/17/17 1859 01/18/17 0010 01/19/17 0602 01/20/17 0524  AST 111* 103* 89* 91*  ALT 36 33 28 29  ALKPHOS 370* 338* 286* 296*  BILITOT 5.0* 4.9* 4.6* 4.4*  PROT 8.1 7.3 6.7 6.9  ALBUMIN 3.4* 3.1* 2.9* 3.0*   No results for input(s): LIPASE, AMYLASE in the last 168 hours. No results for input(s): AMMONIA in the last 168 hours. Cardiac Enzymes:  Recent Labs Lab 01/17/17 1859  TROPONINI <0.03   BNP (last 3  results) No results for input(s): BNP in the last 8760 hours. CBG:  Recent Labs Lab 01/17/17 1802  GLUCAP 121*   Time spent: 35 minutes  Signed:  Elianys Conry  [facility] 01/20/2017 , 1:23 PM

## 2017-02-10 ENCOUNTER — Ambulatory Visit (INDEPENDENT_AMBULATORY_CARE_PROVIDER_SITE_OTHER): Payer: Self-pay | Admitting: Family Medicine

## 2017-02-10 ENCOUNTER — Encounter: Payer: Self-pay | Admitting: Family Medicine

## 2017-02-10 VITALS — BP 126/72 | HR 100 | Temp 98.6°F | Ht 72.0 in | Wt 231.6 lb

## 2017-02-10 DIAGNOSIS — Z1329 Encounter for screening for other suspected endocrine disorder: Secondary | ICD-10-CM

## 2017-02-10 DIAGNOSIS — Z634 Disappearance and death of family member: Secondary | ICD-10-CM

## 2017-02-10 DIAGNOSIS — F4329 Adjustment disorder with other symptoms: Secondary | ICD-10-CM

## 2017-02-10 DIAGNOSIS — Z131 Encounter for screening for diabetes mellitus: Secondary | ICD-10-CM

## 2017-02-10 DIAGNOSIS — R932 Abnormal findings on diagnostic imaging of liver and biliary tract: Secondary | ICD-10-CM

## 2017-02-10 DIAGNOSIS — F4381 Prolonged grief disorder: Secondary | ICD-10-CM

## 2017-02-10 DIAGNOSIS — N179 Acute kidney failure, unspecified: Secondary | ICD-10-CM

## 2017-02-10 DIAGNOSIS — F4321 Adjustment disorder with depressed mood: Secondary | ICD-10-CM

## 2017-02-10 MED ORDER — HYDROXYZINE HCL 50 MG PO TABS: ORAL_TABLET | ORAL | 2 refills | Status: DC

## 2017-02-10 MED ORDER — DICYCLOMINE HCL 10 MG PO CAPS: 10.0000 mg | ORAL_CAPSULE | Freq: Three times a day (TID) | ORAL | 0 refills | Status: DC

## 2017-02-10 MED ORDER — FLUOXETINE HCL 20 MG PO CAPS: 20.0000 mg | ORAL_CAPSULE | Freq: Every day | ORAL | 1 refills | Status: DC

## 2017-02-10 MED ORDER — TRAZODONE HCL 50 MG PO TABS: 50.0000 mg | ORAL_TABLET | Freq: Every evening | ORAL | 3 refills | Status: DC | PRN

## 2017-02-10 NOTE — Progress Notes (Signed)
Michael Mcfarland, is a 51 y.o. male  KXF:[id number]  IRC:[id number]  DOB - Aug 24, 1965  CC:  Chief Complaint  Patient presents with  . Establish Care  . Hospitalization Follow-up    HPI: Michael Mcfarland is a 51 y.o. male is here today to establish care and hospital follow-up. Medical history significant for liver cirrhosis, alcohol abuse, complicated grief, and seizure. On 01/17/2017 while at home patient experienced a new onset seizure. He had recently stopped taking Klonopin which he reports that he had taken for sometime due to persistent anxiety and complicated grief since the death of his wife a few years ago. He admits to years of alcohol use which he reports has been heavy to low moderate, although endorses a long history of drinking. During hospitalization, EEG was negative of abnormal brain activity.  UDS was positive for benzodiazepines. Patient underwent echo cardiogram which was negative for acute findings, EF 55-60%. He experienced AKI which was corrected with IV fluids. He reports a diagnosis of liver disease several years ago, however due to be uninsured he has not received any GI speciality evaluation.  Michael Mcfarland was prescribed Prozac and reports inconsistently taking medication since his discharge. Reports that he is always worried and anxious. Reports that the rainy and gloomy weather is further exacerbating her poor mood. Reports his poor mood causes him to experienced headaches and abdominal upset. Attributes headache to chronic sinus/allergy problems. He is currently on a Flonase and cetirizine regimen. Michael Mcfarland feels he is more anxious than depressed. He reports going to multiple counseling sessions in the past with grief counselors and therapist and he feels this form a therapy did not improve his depression or anxiety symptoms. He requests to resume benzodiazepines as he  feels nothing has improved his symptoms of anxiety.  Social History   Socioeconomic History  . Marital status: Widowed     Spouse name: Not on file  . Number of children: Not on file  . Years of education: Not on file  . Highest education level: Not on file  Social Needs  . Financial resource strain: Not on file  . Food insecurity - worry: Not on file  . Food insecurity - inability: Not on file  . Transportation needs - medical: Not on file  . Transportation needs - non-medical: Not on file  Occupational History  . Not on file  Tobacco Use  . Smoking status: Former Smoker    Packs/day: 1.00    Years: 23.00    Pack years: 23.00    Types: Cigarettes    Last attempt to quit: 12/28/2016    Years since quitting: 0.1  . Smokeless tobacco: Never Used  Substance and Sexual Activity  . Alcohol use: Yes    Alcohol/week: 7.2 oz    Types: 12 Cans of beer per week  . Drug use: No  . Sexual activity: Not on file  Other Topics Concern  . Not on file  Social History Narrative  . Not on file     No Known Allergies Past Medical History:  Diagnosis Date  . Alcohol abuse    /notes 01/18/2017  . Alcohol related seizure ([place]) 01/17/2017   Michael Mcfarland 01/17/2017  . Anxiety   . Depression   . GERD (gastroesophageal reflux disease)   . Migraine    frequency "depends on the weather" (01/19/2017)  . Panic attacks   . Pneumonia    Current Outpatient Medications on File Prior to Visit  Medication Sig Dispense Refill  . FLUoxetine (PROZAC) 10  MG capsule Take 1 capsule (10 mg total) by mouth at bedtime. 30 capsule 0  . folic acid (FOLVITE) 1 MG tablet Take 1 tablet (1 mg total) by mouth daily. 30 tablet 0  . hydrOXYzine (ATARAX/VISTARIL) 25 MG tablet Take 1 tablet (25 mg total) by mouth 3 (three) times daily as needed for anxiety. 30 tablet 0  . thiamine 100 MG tablet Take 1 tablet (100 mg total) by mouth daily. 30 tablet 0   No current facility-administered medications on file prior to visit.    Family History  Problem Relation Age of Onset  . Diabetes Mother   . Depression Mother   . Hypertension Mother     Social History   Socioeconomic History  . Marital status: Widowed    Spouse name: Not on file  . Number of children: Not on file  . Years of education: Not on file  . Highest education level: Not on file  Social Needs  . Financial resource strain: Not on file  . Food insecurity - worry: Not on file  . Food insecurity - inability: Not on file  . Transportation needs - medical: Not on file  . Transportation needs - non-medical: Not on file  Occupational History  . Not on file  Tobacco Use  . Smoking status: Former Smoker    Packs/day: 1.00    Years: 23.00    Pack years: 23.00    Types: Cigarettes    Last attempt to quit: 12/28/2016    Years since quitting: 0.1  . Smokeless tobacco: Never Used  Substance and Sexual Activity  . Alcohol use: Yes    Alcohol/week: 7.2 oz    Types: 12 Cans of beer per week  . Drug use: No  . Sexual activity: Not on file  Other Topics Concern  . Not on file  Social History Narrative  . Not on file    Review of Systems: Constitutional: Negative for fever, chills, diaphoresis, activity change, appetite change and fatigue. HENT: Negative for ear pain, nosebleeds, congestion, facial swelling, rhinorrhea, neck pain, neck stiffness and ear discharge.  Eyes: Negative for pain, discharge, redness, itching and visual disturbance. Respiratory: Negative for cough, choking, chest tightness, shortness of breath, wheezing and stridor.  Cardiovascular: Negative for chest pain, palpitations and leg swelling. Gastrointestinal: Negative for abdominal distention. Genitourinary: Negative for dysuria, urgency, frequency, hematuria, flank pain, decreased urine volume, difficulty urinating and dyspareunia.  Musculoskeletal: Negative for back pain, joint swelling, arthralgia and gait problem. Neurological: Negative for dizziness, tremors, seizures, syncope, facial asymmetry, speech difficulty, weakness, light-headedness, numbness and headaches.  Hematological:  Negative for adenopathy. Does not bruise/bleed easily. Psychiatric/Behavioral: positive anxiousness, sleep disturbances, and decreased concentration.    Objective:   Vitals:   02/10/17 1307  BP: 126/72  Pulse: 100  Temp: 98.6 F (37 C)  SpO2: 99%    Physical Exam: Constitutional: Patient appears well-developed and well-nourished. No distress. HENT: Normocephalic, atraumatic, External right and left ear normal. Oropharynx is clear and moist.  Eyes: Conjunctivae and EOM are normal. PERRLA, no scleral icterus. Neck: Normal ROM. Neck supple. No JVD. No tracheal deviation. No thyromegaly. CVS: RRR, S1/S2 +, no murmurs, no gallops, no carotid bruit.  Pulmonary: Effort and breath sounds normal, no stridor, rhonchi, wheezes, rales.  Abdominal: Soft. BS +, no distension, tenderness, rebound or guarding.  Musculoskeletal: Normal range of motion. No edema and no tenderness.  Lymphadenopathy: No lymphadenopathy noted, cervical, inguinal or axillary Neuro: Alert. Normal reflexes, muscle tone coordination. No cranial nerve  deficit. Skin: Skin is warm and dry. No rash noted. Not diaphoretic. No erythema. No pallor. Psychiatric: Anxious mood and affect. Behavior, judgment, thought content normal.  Lab Results  Component Value Date   WBC 8.7 01/20/2017   HGB 14.3 01/20/2017   HCT 40.3 01/20/2017   MCV 106.6 (H) 01/20/2017   PLT 81 (L) 01/20/2017   Lab Results  Component Value Date   CREATININE 1.24 01/20/2017   BUN 17 01/20/2017   NA 138 01/20/2017   K 3.8 01/20/2017   CL 109 01/20/2017   CO2 19 (L) 01/20/2017    No results found for: HGBA1C Lipid Panel  No results found for: CHOL, TRIG, HDL, CHOLHDL, VLDL, LDLCALC      Assessment and plan:  1. AKI (acute kidney injury) ([place]), last creatinine 1.24. Will recheck creatinine today.  2. Screening for thyroid disorder- Thyroid Panel With TSH  3. Abnormal liver scan, will check LFT today. Recent hepatitis panel negative A, B, C.  Will refer to Gastroenterology for further evaluation and follow-up of what is likely cirrhosis of liver. Patient given a [place] Application to complete.    4. Screening for diabetes mellitus, significant family history for diabetes. Will check an A1c today.  5. Seizure, resolved. Benzodiazepine were not re-prescribed today as these were likely contributory to his seizure activity and he endorses continued alcohol abuse.  6. Generalized Anxiety Disorder, continue Prozac, and take daily and consistent for the next 6 weeks without missing doses. Adding hydroxyzine 50 mg , 3 times daily as needed for anxiety.   7. Complicated Grief  -encouraged counseling and group therapy sessions. He has family support from son. He is negative of suicide and or homicidal thoughts.   Return in about 6 weeks (around 03/24/2017), or Depression and Anxiety.  The patient was given clear instructions to go to ER or return to medical center if symptoms don't improve, worsen or new problems develop. The patient verbalized understanding. The patient was told to call to get lab results if they haven't heard anything in the next week.     This note has been created with Surveyor, quantity. Any transcriptional errors are unintentional.

## 2017-02-10 NOTE — Patient Instructions (Addendum)
For depression, I have increased your Fluoxetine 20 mg once daily in mornings.  For anxiety, I have increased hydroxyzine 50 mg twice daily as needed and you may increase to 100 mg at bedtime.  For insomnia, I have added Trazodone 50-100 mg 1-2 tablets as needed at bedtime.  For further evaluation of your liver, I will refer you to gastroenterology. Complete the [place] Application in order to obtain coverage for speciality visit.  For diarrhea, I have prescribed bentyl 10 mg 3 times daily as needed.   If you desire to participate in  behavioral health services please contact:  Local mental health resources:  [facility] [place], [place] [phone]    Complicated Grieving Grief is a normal response to the death of someone close to you. Feelings of fear, anger, and guilt can affect almost everyone who loses a loved one. It is also common to have symptoms of depression while you are grieving. These include problems with sleep, loss of appetite, and lack of energy. They may last for weeks or months after a loss. Complicated grief is different from normal grief or depression. Normal grieving involves sadness and feelings of loss, but these feelings are not constant. Complicated grief is a constant and severe type of grief. It interferes with your ability to function normally. It may last for several months to a year or longer. Complicated grief may require treatment from a mental health care provider. What are the causes? It is not known why some people continue to struggle with grief and others do not. You may be at higher risk for complicated grief if:  The death of your loved one was sudden or unexpected.  The death of your loved one was due to a violent event.  Your loved one committed suicide.  Your loved one was a child or a young person.  You were very close to or dependent on the loved one.  You have a history of  depression.  What are the signs or symptoms? Signs and symptoms of complicated grief may include:  Feeling disbelief or numbness.  Being unable to enjoy good memories of your loved one.  Needing to avoid anything that reminds you of your loved one.  Being unable to stop thinking about the death.  Feeling intense anger or guilt.  Feeling alone and hopeless.  Feeling that your life is meaningless and empty.  Losing the desire to live.  How is this diagnosed? Your health care provider may diagnose complicated grief if:  You have constant symptoms of grief for 6-12 months or longer.  Your symptoms are interfering with your ability to live your life.  Your health care provider may want you to see a mental health care provider. Many symptoms of depression are similar to the symptoms of complicated grief. It is important to be evaluated for complicated grief along with other mental health conditions. How is this treated? Talk therapy with a mental health provider is the most common treatment for complicated grief. During therapy, you will learn healthy ways to cope with the loss of your loved one. In some cases, your mental health care provider may also recommend antidepressant medicines. Follow these instructions at home:  Take care of yourself. ? Eat regular meals and maintain a healthy diet. Eat plenty of fruits, vegetables, and whole grains. ? Try to get some exercise each day. ? Keep regular hours for sleep. Try to get at least 8 hours of sleep each  night.  Do not use drugs or alcohol to ease your symptoms.  Take medicines only as directed by your health care provider.  Spend time with friends and loved ones.  Consider joining a grief (bereavement) support group to help you deal with your loss.  Keep all follow-up visits as directed by your health care provider. This is important. Contact a health care provider if:  Your symptoms keep you from functioning  normally.  Your symptoms do not get better with treatment. Get help right away if:  You have serious thoughts of hurting yourself or someone else.  You have suicidal feelings. This information is not intended to replace advice given to you by your health care provider. Make sure you discuss any questions you have with your health care provider. Document Released: 03/16/2005 Document Revised: 08/22/2015 Document Reviewed: 08/24/2013 Elsevier Interactive Patient Education  2018 [place].      Generalized Anxiety Disorder, Adult Generalized anxiety disorder (GAD) is a mental health disorder. People with this condition constantly worry about everyday events. Unlike normal anxiety, worry related to GAD is not triggered by a specific event. These worries also do not fade or get better with time. GAD interferes with life functions, including relationships, work, and school. GAD can vary from mild to severe. People with severe GAD can have intense waves of anxiety with physical symptoms (panic attacks). What are the causes? The exact cause of GAD is not known. What increases the risk? This condition is more likely to develop in:  Women.  People who have a family history of anxiety disorders.  People who are very shy.  People who experience very stressful life events, such as the death of a loved one.  People who have a very stressful family environment.  What are the signs or symptoms? People with GAD often worry excessively about many things in their lives, such as their health and family. They may also be overly concerned about:  Doing well at work.  Being on time.  Natural disasters.  Friendships.  Physical symptoms of GAD include:  Fatigue.  Muscle tension or having muscle twitches.  Trembling or feeling shaky.  Being easily startled.  Feeling like your heart is pounding or racing.  Feeling out of breath or like you cannot take a deep breath.  Having  trouble falling asleep or staying asleep.  Sweating.  Nausea, diarrhea, or irritable bowel syndrome (IBS).  Headaches.  Trouble concentrating or remembering facts.  Restlessness.  Irritability.  How is this diagnosed? Your health care provider can diagnose GAD based on your symptoms and medical history. You will also have a physical exam. The health care provider will ask specific questions about your symptoms, including how severe they are, when they started, and if they come and go. Your health care provider may ask you about your use of alcohol or drugs, including prescription medicines. Your health care provider may refer you to a mental health specialist for further evaluation. Your health care provider will do a thorough examination and may perform additional tests to rule out other possible causes of your symptoms. To be diagnosed with GAD, a person must have anxiety that:  Is out of his or her control.  Affects several different aspects of his or her life, such as work and relationships.  Causes distress that makes him or her unable to take part in normal activities.  Includes at least three physical symptoms of GAD, such as restlessness, fatigue, trouble concentrating, irritability, muscle tension, or  sleep problems.  Before your health care provider can confirm a diagnosis of GAD, these symptoms must be present more days than they are not, and they must last for six months or longer. How is this treated? The following therapies are usually used to treat GAD:  Medicine. Antidepressant medicine is usually prescribed for long-term daily control. Antianxiety medicines may be added in severe cases, especially when panic attacks occur.  Talk therapy (psychotherapy). Certain types of talk therapy can be helpful in treating GAD by providing support, education, and guidance. Options include: ? Cognitive behavioral therapy (CBT). People learn coping skills and techniques to ease  their anxiety. They learn to identify unrealistic or negative thoughts and behaviors and to replace them with positive ones. ? Acceptance and commitment therapy (ACT). This treatment teaches people how to be mindful as a way to cope with unwanted thoughts and feelings. ? Biofeedback. This process trains you to manage your body's response (physiological response) through breathing techniques and relaxation methods. You will work with a therapist while machines are used to monitor your physical symptoms.  Stress management techniques. These include yoga, meditation, and exercise.  A mental health specialist can help determine which treatment is best for you. Some people see improvement with one type of therapy. However, other people require a combination of therapies. Follow these instructions at home:  Take over-the-counter and prescription medicines only as told by your health care provider.  Try to maintain a normal routine.  Try to anticipate stressful situations and allow extra time to manage them.  Practice any stress management or self-calming techniques as taught by your health care provider.  Do not punish yourself for setbacks or for not making progress.  Try to recognize your accomplishments, even if they are small.  Keep all follow-up visits as told by your health care provider. This is important. Contact a health care provider if:  Your symptoms do not get better.  Your symptoms get worse.  You have signs of depression, such as: ? A persistently sad, cranky, or irritable mood. ? Loss of enjoyment in activities that used to bring you joy. ? Change in weight or eating. ? Changes in sleeping habits. ? Avoiding friends or family members. ? Loss of energy for normal tasks. ? Feelings of guilt or worthlessness. Get help right away if:  You have serious thoughts about hurting yourself or others. If you ever feel like you may hurt yourself or others, or have thoughts about  taking your own life, get help right away. You can go to your nearest emergency department or call:  Your local emergency services (911 in the [place]  A suicide crisis helpline, such as the [facility] at 320 131 3275. This is open 24 hours a day.  Summary  Generalized anxiety disorder (GAD) is a mental health disorder that involves worry that is not triggered by a specific event.  People with GAD often worry excessively about many things in their lives, such as their health and family.  GAD may cause physical symptoms such as restlessness, trouble concentrating, sleep problems, frequent sweating, nausea, diarrhea, headaches, and trembling or muscle twitching.  A mental health specialist can help determine which treatment is best for you. Some people see improvement with one type of therapy. However, other people require a combination of therapies. This information is not intended to replace advice given to you by your health care provider. Make sure you discuss any questions you have with your health care provider. Document Released:  07/11/2012 Document Revised: 02/04/2016 Document Reviewed: 02/04/2016 Elsevier Interactive Patient Education  2018 [place] With Depression Everyone experiences occasional disappointment, sadness, and loss in their lives. When you are feeling down, blue, or sad for at least 2 weeks in a row, it may mean that you have depression. Depression can affect your thoughts and feelings, relationships, daily activities, and physical health. It is caused by changes in the way your brain functions. If you receive a diagnosis of depression, your health care provider will tell you which type of depression you have and what treatment options are available to you. If you are living with depression, there are ways to help you recover from it and also ways to prevent it from coming back. How to cope with lifestyle changes Coping with  stress Stress is your body's reaction to life changes and events, both good and bad. Stressful situations may include:  Getting married.  The death of a spouse.  Losing a job.  Retiring.  Having a baby.  Stress can last just a few hours or it can be ongoing. Stress can play a major role in depression, so it is important to learn both how to cope with stress and how to think about it differently. Talk with your health care provider or a counselor if you would like to learn more about stress reduction. He or she may suggest some stress reduction techniques, such as:  Music therapy. This can include creating music or listening to music. Choose music that you enjoy and that inspires you.  Mindfulness-based meditation. This kind of meditation can be done while sitting or walking. It involves being aware of your normal breaths, rather than trying to control your breathing.  Centering prayer. This is a kind of meditation that involves focusing on a spiritual word or phrase. Choose a word, phrase, or sacred image that is meaningful to you and that brings you peace.  Deep breathing. To do this, expand your stomach and inhale slowly through your nose. Hold your breath for 3-5 seconds, then exhale slowly, allowing your stomach muscles to relax.  Muscle relaxation. This involves intentionally tensing muscles then relaxing them.  Choose a stress reduction technique that fits your lifestyle and personality. Stress reduction techniques take time and practice to develop. Set aside 5-15 minutes a day to do them. Therapists can offer training in these techniques. The training may be covered by some insurance plans. Other things you can do to manage stress include:  Keeping a stress diary. This can help you learn what triggers your stress and ways to control your response.  Understanding what your limits are and saying no to requests or events that lead to a schedule that is too full.  Thinking about  how you respond to certain situations. You may not be able to control everything, but you can control how you react.  Adding humor to your life by watching funny films or TV shows.  Making time for activities that help you relax and not feeling guilty about spending your time this way.  Medicines Your health care provider may suggest certain medicines if he or she feels that they will help improve your condition. Avoid using alcohol and other substances that may prevent your medicines from working properly (may interact). It is also important to:  Talk with your pharmacist or health care provider about all the medicines that you take, their possible side effects, and what medicines are safe to take together.  Make it your goal  to take part in all treatment decisions (shared decision-making). This includes giving input on the side effects of medicines. It is best if shared decision-making with your health care provider is part of your total treatment plan.  If your health care provider prescribes a medicine, you may not notice the full benefits of it for 4-8 weeks. Most people who are treated for depression need to be on medicine for at least 6-12 months after they feel better. If you are taking medicines as part of your treatment, do not stop taking medicines without first talking to your health care provider. You may need to have the medicine slowly decreased (tapered) over time to decrease the risk of harmful side effects. Relationships Your health care provider may suggest family therapy along with individual therapy and drug therapy. While there may not be family problems that are causing you to feel depressed, it is still important to make sure your family learns as much as they can about your mental health. Having your family's support can help make your treatment successful. How to recognize changes in your condition Everyone has a different response to treatment for depression. Recovery from  major depression happens when you have not had signs of major depression for two months. This may mean that you will start to:  Have more interest in doing activities.  Feel less hopeless than you did 2 months ago.  Have more energy.  Overeat less often, or have better or improving appetite.  Have better concentration.  Your health care provider will work with you to decide the next steps in your recovery. It is also important to recognize when your condition is getting worse. Watch for these signs:  Having fatigue or low energy.  Eating too much or too little.  Sleeping too much or too little.  Feeling restless, agitated, or hopeless.  Having trouble concentrating or making decisions.  Having unexplained physical complaints.  Feeling irritable, angry, or aggressive.  Get help as soon as you or your family members notice these symptoms coming back. How to get support and help from others How to talk with friends and family members about your condition Talking to friends and family members about your condition can provide you with one way to get support and guidance. Reach out to trusted friends or family members, explain your symptoms to them, and let them know that you are working with a health care provider to treat your depression. Financial resources Not all insurance plans cover mental health care, so it is important to check with your insurance carrier. If paying for co-pays or counseling services is a problem, search for a local or county mental health care center. They may be able to offer public mental health care services at low or no cost when you are not able to see a private health care provider. If you are taking medicine for depression, you may be able to get the generic form, which may be less expensive. Some makers of prescription medicines also offer help to patients who cannot afford the medicines they need. Follow these instructions at home:  Get the right  amount and quality of sleep.  Cut down on using caffeine, tobacco, alcohol, and other potentially harmful substances.  Try to exercise, such as walking or lifting small weights.  Take over-the-counter and prescription medicines only as told by your health care provider.  Eat a healthy diet that includes plenty of vegetables, fruits, whole grains, low-fat dairy products, and lean protein. Do not  eat a lot of foods that are high in solid fats, added sugars, or salt.  Keep all follow-up visits as told by your health care provider. This is important. Contact a health care provider if:  You stop taking your antidepressant medicines, and you have any of these symptoms: ? Nausea. ? Headache. ? Feeling lightheaded. ? Chills and body aches. ? Not being able to sleep (insomnia).  You or your friends and family think your depression is getting worse. Get help right away if:  You have thoughts of hurting yourself or others. If you ever feel like you may hurt yourself or others, or have thoughts about taking your own life, get help right away. You can go to your nearest emergency department or call:  Your local emergency services (911 in the [place]  A suicide crisis helpline, such as the [facility] at 802-584-9187. This is open 24-hours a day.  Summary  If you are living with depression, there are ways to help you recover from it and also ways to prevent it from coming back.  Work with your health care team to create a management plan that includes counseling, stress management techniques, and healthy lifestyle habits. This information is not intended to replace advice given to you by your health care provider. Make sure you discuss any questions you have with your health care provider. Document Released: 02/17/2016 Document Revised: 02/17/2016 Document Reviewed: 02/17/2016 Elsevier Interactive Patient Education  [employer].

## 2017-02-11 LAB — COMPLETE METABOLIC PANEL WITH GFR
AG RATIO: 0.9 (calc) — AB (ref 1.0–2.5)
ALKALINE PHOSPHATASE (APISO): 334 U/L — AB (ref 40–115)
ALT: 32 U/L (ref 9–46)
AST: 91 U/L — ABNORMAL HIGH (ref 10–35)
Albumin: 3.6 g/dL (ref 3.6–5.1)
BILIRUBIN TOTAL: 3.3 mg/dL — AB (ref 0.2–1.2)
BUN: 9 mg/dL (ref 7–25)
CHLORIDE: 106 mmol/L (ref 98–110)
CO2: 19 mmol/L — ABNORMAL LOW (ref 20–32)
Calcium: 9 mg/dL (ref 8.6–10.3)
Creat: 0.88 mg/dL (ref 0.70–1.33)
GFR, Est African American: 114 mL/min/{1.73_m2} (ref >=60)
GFR, Est Non African American: 98 mL/min/{1.73_m2} (ref >=60)
GLUCOSE: 112 mg/dL — AB (ref 65–99)
Globulin: 3.9 g/dL (calc) — ABNORMAL HIGH (ref 1.9–3.7)
POTASSIUM: 4 mmol/L (ref 3.5–5.3)
Sodium: 141 mmol/L (ref 135–146)
Total Protein: 7.5 g/dL (ref 6.1–8.1)

## 2017-02-11 LAB — THYROID PANEL WITH TSH
FREE THYROXINE INDEX: 1.9 (ref 1.4–3.8)
T3 UPTAKE: 30 % (ref 22–35)
T4, Total: 6.3 ug/dL (ref 4.9–10.5)
TSH: 1.32 m[IU]/L (ref 0.40–4.50)

## 2017-02-11 LAB — HEMOGLOBIN A1C
Hgb A1c MFr Bld: 5.6 % of total Hgb (ref <5.7)
Mean Plasma Glucose: 114 (calc)
eAG (mmol/L): 6.3 (calc)

## 2017-03-09 ENCOUNTER — Other Ambulatory Visit: Payer: Self-pay | Admitting: Family Medicine

## 2017-03-26 ENCOUNTER — Ambulatory Visit: Payer: PRIVATE HEALTH INSURANCE | Admitting: Family Medicine

## 2017-03-31 ENCOUNTER — Other Ambulatory Visit: Payer: Self-pay | Admitting: Family Medicine

## 2017-05-20 ENCOUNTER — Emergency Department (HOSPITAL_COMMUNITY): Payer: Self-pay

## 2017-05-20 ENCOUNTER — Other Ambulatory Visit: Payer: Self-pay

## 2017-05-20 ENCOUNTER — Inpatient Hospital Stay (HOSPITAL_COMMUNITY)
Admission: EM | Admit: 2017-05-20 | Discharge: 2017-05-23 | DRG: 101 | Disposition: A | Discharge status: Home / Self Care | Payer: Self-pay | Attending: Internal Medicine | Admitting: Internal Medicine

## 2017-05-20 ENCOUNTER — Encounter (HOSPITAL_COMMUNITY): Payer: Self-pay | Admitting: Emergency Medicine

## 2017-05-20 DIAGNOSIS — F329 Major depressive disorder, single episode, unspecified: Secondary | ICD-10-CM | POA: Diagnosis present

## 2017-05-20 DIAGNOSIS — L8990 Pressure ulcer of unspecified site, unspecified stage: Secondary | ICD-10-CM

## 2017-05-20 DIAGNOSIS — Z87891 Personal history of nicotine dependence: Secondary | ICD-10-CM

## 2017-05-20 DIAGNOSIS — E876 Hypokalemia: Secondary | ICD-10-CM | POA: Diagnosis present

## 2017-05-20 DIAGNOSIS — K219 Gastro-esophageal reflux disease without esophagitis: Secondary | ICD-10-CM | POA: Diagnosis present

## 2017-05-20 DIAGNOSIS — F10239 Alcohol dependence with withdrawal, unspecified: Secondary | ICD-10-CM | POA: Diagnosis present

## 2017-05-20 DIAGNOSIS — D696 Thrombocytopenia, unspecified: Secondary | ICD-10-CM | POA: Diagnosis present

## 2017-05-20 DIAGNOSIS — Z981 Arthrodesis status: Secondary | ICD-10-CM

## 2017-05-20 DIAGNOSIS — Z79899 Other long term (current) drug therapy: Secondary | ICD-10-CM

## 2017-05-20 DIAGNOSIS — F410 Panic disorder [episodic paroxysmal anxiety] without agoraphobia: Secondary | ICD-10-CM | POA: Diagnosis present

## 2017-05-20 DIAGNOSIS — L89899 Pressure ulcer of other site, unspecified stage: Secondary | ICD-10-CM | POA: Diagnosis present

## 2017-05-20 DIAGNOSIS — G4089 Other seizures: Principal | ICD-10-CM | POA: Diagnosis present

## 2017-05-20 DIAGNOSIS — R569 Unspecified convulsions: Secondary | ICD-10-CM

## 2017-05-20 DIAGNOSIS — E872 Acidosis: Secondary | ICD-10-CM | POA: Diagnosis present

## 2017-05-20 DIAGNOSIS — F1010 Alcohol abuse, uncomplicated: Secondary | ICD-10-CM

## 2017-05-20 HISTORY — DX: Unspecified convulsions: R56.9

## 2017-05-20 LAB — CBC
HCT: 40.4 % (ref 39.0–52.0)
HEMOGLOBIN: 14.7 g/dL (ref 13.0–17.0)
MCH: 35.2 pg — ABNORMAL HIGH (ref 26.0–34.0)
MCHC: 36.4 g/dL — AB (ref 30.0–36.0)
MCV: 96.7 fL (ref 78.0–100.0)
Platelets: 124 10*3/uL — ABNORMAL LOW (ref 150–400)
RBC: 4.18 MIL/uL — ABNORMAL LOW (ref 4.22–5.81)
RDW: 17.1 % — ABNORMAL HIGH (ref 11.5–15.5)
WBC: 12.1 10*3/uL — ABNORMAL HIGH (ref 4.0–10.5)

## 2017-05-20 LAB — COMPREHENSIVE METABOLIC PANEL
ALBUMIN: 3 g/dL — AB (ref 3.5–5.0)
ALT: 33 U/L (ref 17–63)
ANION GAP: 14 (ref 5–15)
AST: 105 U/L — ABNORMAL HIGH (ref 15–41)
Alkaline Phosphatase: 191 U/L — ABNORMAL HIGH (ref 38–126)
BILIRUBIN TOTAL: 4.5 mg/dL — AB (ref 0.3–1.2)
BUN: 6 mg/dL (ref 6–20)
CO2: 19 mmol/L — AB (ref 22–32)
Calcium: 8.7 mg/dL — ABNORMAL LOW (ref 8.9–10.3)
Chloride: 101 mmol/L (ref 101–111)
Creatinine, Ser: 1.08 mg/dL (ref 0.61–1.24)
GFR calc Af Amer: >60 mL/min (ref >60)
GFR calc non Af Amer: >60 mL/min (ref >60)
GLUCOSE: 161 mg/dL — AB (ref 65–99)
POTASSIUM: 4.2 mmol/L (ref 3.5–5.1)
SODIUM: 134 mmol/L — AB (ref 135–145)
TOTAL PROTEIN: 7.2 g/dL (ref 6.5–8.1)

## 2017-05-20 LAB — URINALYSIS, ROUTINE W REFLEX MICROSCOPIC
Bilirubin Urine: NEGATIVE
Glucose, UA: NEGATIVE mg/dL
Ketones, ur: 5 mg/dL — AB
Leukocytes, UA: NEGATIVE
Nitrite: NEGATIVE
PROTEIN: 30 mg/dL — AB
SPECIFIC GRAVITY, URINE: 1.018 (ref 1.005–1.030)
pH: 5 (ref 5.0–8.0)

## 2017-05-20 LAB — CBG MONITORING, ED: GLUCOSE-CAPILLARY: 169 mg/dL — AB (ref 65–99)

## 2017-05-20 LAB — RAPID URINE DRUG SCREEN, HOSP PERFORMED
AMPHETAMINES: NOT DETECTED
Barbiturates: NOT DETECTED
Benzodiazepines: POSITIVE — AB
COCAINE: NOT DETECTED
OPIATES: NOT DETECTED
TETRAHYDROCANNABINOL: NOT DETECTED

## 2017-05-20 LAB — TROPONIN I: Troponin I: 0.03 ng/mL (ref <0.03)

## 2017-05-20 LAB — ACETAMINOPHEN LEVEL

## 2017-05-20 LAB — SALICYLATE LEVEL

## 2017-05-20 LAB — ETHANOL

## 2017-05-20 MED ORDER — TRAZODONE HCL 50 MG PO TABS
50.0000 mg | ORAL_TABLET | Freq: Every evening | ORAL | Status: DC | PRN
  Administered 2017-05-21 – 2017-05-22 (×3): 100 mg via ORAL
  Filled 2017-05-20 (×3): qty 2

## 2017-05-20 MED ORDER — THIAMINE HCL 100 MG/ML IJ SOLN
100.0000 mg | Freq: Every day | INTRAMUSCULAR | Status: DC
  Administered 2017-05-20 – 2017-05-21 (×2): 100 mg via INTRAVENOUS
  Filled 2017-05-20 (×2): qty 2

## 2017-05-20 MED ORDER — LORAZEPAM 2 MG/ML IJ SOLN: 0.0000 mg | Freq: Four times a day (QID) | INTRAMUSCULAR | Status: DC

## 2017-05-20 MED ORDER — VITAMIN B-1 100 MG PO TABS
100.0000 mg | ORAL_TABLET | Freq: Every day | ORAL | Status: DC
  Administered 2017-05-20: 100 mg via ORAL
  Filled 2017-05-20: qty 1

## 2017-05-20 MED ORDER — LORAZEPAM 1 MG PO TABS: 0.0000 mg | ORAL_TABLET | Freq: Two times a day (BID) | ORAL | Status: DC

## 2017-05-20 MED ORDER — SODIUM CHLORIDE 0.9 % IV BOLUS (SEPSIS)
1000.0000 mL | Freq: Once | INTRAVENOUS | Status: AC
  Administered 2017-05-20: 1000 mL via INTRAVENOUS

## 2017-05-20 MED ORDER — LORAZEPAM 1 MG PO TABS
0.0000 mg | ORAL_TABLET | Freq: Four times a day (QID) | ORAL | Status: DC
  Administered 2017-05-20: 1 mg via ORAL

## 2017-05-20 MED ORDER — CHLORDIAZEPOXIDE HCL 5 MG PO CAPS
50.0000 mg | ORAL_CAPSULE | Freq: Every day | ORAL | Status: AC
  Administered 2017-05-20: 50 mg via ORAL
  Filled 2017-05-20: qty 10

## 2017-05-20 MED ORDER — HYDROXYZINE HCL 25 MG PO TABS
100.0000 mg | ORAL_TABLET | Freq: Every evening | ORAL | Status: DC | PRN
  Administered 2017-05-20: 100 mg via ORAL
  Filled 2017-05-20: qty 4

## 2017-05-20 MED ORDER — CHLORDIAZEPOXIDE HCL 5 MG PO CAPS
25.0000 mg | ORAL_CAPSULE | Freq: Every day | ORAL | Status: DC
  Administered 2017-05-21 – 2017-05-22 (×2): 25 mg via ORAL
  Filled 2017-05-20 (×2): qty 5

## 2017-05-20 MED ORDER — FOLIC ACID 5 MG/ML IJ SOLN
1.0000 mg | Freq: Every day | INTRAMUSCULAR | Status: DC
  Administered 2017-05-20: 1 mg via INTRAVENOUS
  Filled 2017-05-20 (×2): qty 0.2

## 2017-05-20 MED ORDER — ACETAMINOPHEN 325 MG PO TABS
650.0000 mg | ORAL_TABLET | Freq: Four times a day (QID) | ORAL | Status: DC | PRN
  Administered 2017-05-20 – 2017-05-22 (×3): 650 mg via ORAL
  Filled 2017-05-20 (×3): qty 2

## 2017-05-20 MED ORDER — FLUOXETINE HCL 20 MG PO CAPS
20.0000 mg | ORAL_CAPSULE | Freq: Every day | ORAL | Status: DC
  Administered 2017-05-20 – 2017-05-23 (×4): 20 mg via ORAL
  Filled 2017-05-20 (×4): qty 1

## 2017-05-20 MED ORDER — ACETAMINOPHEN 650 MG RE SUPP: 650.0000 mg | Freq: Four times a day (QID) | RECTAL | Status: DC | PRN

## 2017-05-20 MED ORDER — LORAZEPAM 2 MG/ML IJ SOLN: 0.0000 mg | Freq: Two times a day (BID) | INTRAMUSCULAR | Status: DC

## 2017-05-20 MED ORDER — HYDROXYZINE HCL 25 MG PO TABS: 100.0000 mg | ORAL_TABLET | Freq: Two times a day (BID) | ORAL | Status: DC | PRN

## 2017-05-20 MED ORDER — SODIUM CHLORIDE 0.9 % IV SOLN
INTRAVENOUS | Status: AC
  Administered 2017-05-20: 22:00:00 via INTRAVENOUS

## 2017-05-20 MED ORDER — HYDROCODONE-ACETAMINOPHEN 5-325 MG PO TABS
1.0000 | ORAL_TABLET | Freq: Once | ORAL | Status: AC
  Administered 2017-05-20: 1 via ORAL
  Filled 2017-05-20: qty 1

## 2017-05-20 MED ORDER — THIAMINE HCL 100 MG/ML IJ SOLN: 100.0000 mg | Freq: Every day | INTRAMUSCULAR | Status: DC

## 2017-05-20 MED ORDER — LORAZEPAM 2 MG/ML IJ SOLN: 2.0000 mg | INTRAMUSCULAR | Status: DC | PRN

## 2017-05-20 NOTE — ED Triage Notes (Signed)
Per [facility] patient had a new onset seizure earlier today and was able to clear. A second seizure happened approx 1 hour later.

## 2017-05-20 NOTE — ED Notes (Signed)
Attempted to call report

## 2017-05-20 NOTE — ED Provider Notes (Signed)
[facility] EMERGENCY DEPARTMENT Provider Note   CSN: [id number] Arrival date & time: 05/20/17  1437     History   Chief Complaint Chief Complaint  Patient presents with  . Seizures    HPI Michael Mcfarland is a 52 y.o. male.  52yo M w/ PMH including alcohol abuse w/ previous related seizure, anxiety/depression, migraines who p/w seizure.  Patient reports that he was at his home earlier today getting ready to shave and the next thing he knew he was on the floor with EMS around him.  He reports that his son called EMS due to seizure activity and son reported that he had a second seizure approximately 1 hour after the first 1.  Patient unable to recall details.  He states he thinks he had a seizure previously.  He endorses daily alcohol use, approximately 4-6 beers, and has been cutting back recently but his last beer was last night.  He denies any other drug use.  He reports chronic back pain but no new pain.  He denies any focal numbness or weakness.  No fevers, cough, vomiting, or recent illness.  No new medications.   The history is provided by the patient.  Seizures      Past Medical History:  Diagnosis Date  . Alcohol abuse    /notes 01/18/2017  . Alcohol related seizure ([place]) 01/17/2017   Archie Endo 01/17/2017  . Anxiety   . Depression   . GERD (gastroesophageal reflux disease)   . Migraine    frequency "depends on the weather" (01/19/2017)  . Panic attacks   . Pneumonia     Patient Active Problem List   Diagnosis Date Noted  . AKI (acute kidney injury) ([place])   . Seizure ([place])   . Cirrhosis ([place])   . Syncope   . Alcohol abuse   . Thrombocytopenia ([place])   . Alcohol withdrawal seizure ([place]) 01/17/2017    Past Surgical History:  Procedure Laterality Date  . ANTERIOR CERVICAL DECOMP/DISCECTOMY FUSION    . BACK SURGERY         Home Medications    Prior to Admission medications   Medication Sig Start Date End Date Taking? Authorizing Provider   dicyclomine (BENTYL) 10 MG capsule TAKE 1 CAPSULE BY MOUTH THREE TIMES A DAY BEFORE MEALS 03/09/17   Scot Jun, FNP  dicyclomine (BENTYL) 10 MG capsule TAKE 1 CAPSULE BY MOUTH THREE TIMES A DAY BEFORE MEALS 04/01/17   Scot Jun, FNP  FLUoxetine (PROZAC) 20 MG capsule Take 1 capsule (20 mg total) daily by mouth. 02/10/17   Scot Jun, FNP  folic acid (FOLVITE) 1 MG tablet Take 1 tablet (1 mg total) by mouth daily. 01/21/17   Lavina Hamman, MD  hydrOXYzine (ATARAX/VISTARIL) 50 MG tablet Take 1 tablet twice daily as needed for anxiety. Take 2 tablets at bedtime as needed for insomnia and anxiety. 02/10/17   Scot Jun, FNP  thiamine 100 MG tablet Take 1 tablet (100 mg total) by mouth daily. 01/21/17   Lavina Hamman, MD  traZODone (DESYREL) 50 MG tablet Take 1-2 tablets (50-100 mg total) at bedtime as needed by mouth for sleep. 02/10/17   Scot Jun, FNP    Family History Family History  Problem Relation Age of Onset  . Diabetes Mother   . Depression Mother   . Hypertension Mother     Social History Social History   Tobacco Use  . Smoking status: Former Smoker  Packs/day: 1.00    Years: 23.00    Pack years: 23.00    Types: Cigarettes    Last attempt to quit: 12/28/2016    Years since quitting: 0.3  . Smokeless tobacco: Never Used  Substance Use Topics  . Alcohol use: Yes    Alcohol/week: 7.2 oz    Types: 12 Cans of beer per week  . Drug use: No     Allergies   Patient has no known allergies.   Review of Systems Review of Systems  Neurological: Positive for seizures.   All other systems reviewed and are negative except that which was mentioned in HPI   Physical Exam Updated Vital Signs BP 139/85   Pulse (!) 103   Temp 97.8 F (36.6 C) (Oral)   Resp 18   Ht 5\' 10"  (1.778 m)   Wt 95.3 kg (210 lb)   SpO2 93%   BMI 30.13 kg/m   Physical Exam  Constitutional: He is oriented to person, place, and time. He appears  well-developed and well-nourished. No distress.  HENT:  Head: Normocephalic.  Dried blood on lips and in mouth, laceration across middle of tongue  Eyes: Conjunctivae are normal. Pupils are equal, round, and reactive to light.  Neck:  In c-collar  Cardiovascular: Normal rate, regular rhythm and normal heart sounds.  No murmur heard. Pulmonary/Chest: Effort normal and breath sounds normal.  Abdominal: Soft. Bowel sounds are normal. He exhibits no distension. There is no tenderness.  Musculoskeletal: He exhibits no edema.  Neurological: He is alert and oriented to person, place, and time. No sensory deficit.  Fluent speech 5/5 strength x all 4 ext  Skin: Skin is warm and dry.  Psychiatric: He has a normal mood and affect.  Nursing note and vitals reviewed.    ED Treatments / Results  Labs (all labs ordered are listed, but only abnormal results are displayed) Labs Reviewed  CBC - Abnormal; Notable for the following components:      Result Value   WBC 12.1 (*)    RBC 4.18 (*)    MCH 35.2 (*)    MCHC 36.4 (*)    RDW 17.1 (*)    Platelets 124 (*)    All other components within normal limits  CBG MONITORING, ED - Abnormal; Notable for the following components:   Glucose-Capillary 169 (*)    All other components within normal limits  COMPREHENSIVE METABOLIC PANEL  ETHANOL  SALICYLATE LEVEL  ACETAMINOPHEN LEVEL  URINALYSIS, ROUTINE W REFLEX MICROSCOPIC  RAPID URINE DRUG SCREEN, HOSP PERFORMED    EKG  EKG Interpretation  Date/Time:  Thursday May 20 2017 14:39:56 EST Ventricular Rate:  104 PR Interval:    QRS Duration: 83 QT Interval:  352 QTC Calculation: 463 R Axis:   41 Text Interpretation:  Sinus tachycardia Probable left atrial enlargement No significant change since last tracing Confirmed by Theotis Burrow [phone]) on 05/20/2017 2:58:23 PM       Radiology No results found.  Procedures Procedures (including critical care time)  Medications Ordered in  ED Medications - No data to display   Initial Impression / Assessment and Plan / ED Course  I have reviewed the triage vital signs and the nursing notes.  Pertinent labs & imaging results that were available during my care of the patient were reviewed by me and considered in my medical decision making (see chart for details).     H/o alcohol use and alcohol related seizure p/w seizure activity at home. Awake  and alert, GCS 15 on my exam w/ no focal deficits, VS reassuring. His bitten tongue does suggest seizure activity.  Labs show UDS + benzos, EtOH neg, WBC 12.2. CT head and c-spine neg for acute injury.  I am concerned that his seizure activity may be related to alcohol withdrawal, he has been mildly tachycardic here although not delirious.  I discussed with neurology, Dr. Rory Percy, who advised no need for antiepileptic meds currently but would obtain EEG and MRI during admission. Discussed admission w/ Dr. Maudie Mercury, hospitalist, and pt admitted for CIWA protocol and further work up.   Final Clinical Impressions(s) / ED Diagnoses   Final diagnoses:  None    ED Discharge Orders    None       Little, Wenda Overland, MD 05/21/17 0021

## 2017-05-20 NOTE — H&P (Signed)
TR[facility] H&P   Patient Demographics:    Michael Mcfarland, is a 52 y.o. male  MRN: [id number]   DOB - [birth year]  Admit Date - 05/20/2017  Outpatient Primary MD for the patient is Scot Jun, FNP  Referring MD/NP/PA: Enis Gash  Outpatient Specialists:  Patient coming from: home  Chief Complaint  Patient presents with  . Seizures      HPI:    Michael Mcfarland  is a 52 y.o. male, w etoh abuse, alcohol related seizure, apparently was found at home by son ? witnessed seizure.   In Ed.  CXR IMPRESSION: No acute abnormality noted.  Likely chronic thoracic compression of in the lower thoracic spine.  CT brain IMPRESSION: Normal head CT.  Status post surgical anterior fusion of C5-6 and C6-7. No acute abnormality seen in the cervical spine.  UDS benzo + Urinalysis negative  ETOH <10  Wbc 12.1, Hgb 14.7, Plt 124 Na 134, K 4.2,  Hco3 19 Glucose 161 Bun 6, Creatinine 1.08 Alb 3.0 Ast 105, Alt 33 Alk phos 191 T. Bili 4.5  ED curbsided neurology who thought apparently that this seizure related to etoh withdrawal and would not recommend starting antiepilectic at this time.  Recommended MRI, brain and EEG  Pt will be admitted for ? etoh withdrawal seizure.     Review of systems:    In addition to the HPI above, No Fever-chills, No Headache, No changes with Vision or hearing, No problems swallowing food or Liquids, No Chest pain, Cough or Shortness of Breath, No Abdominal pain, No Nausea or Vommitting, Bowel movements are regular, No Blood in stool or Urine, No dysuria, No new skin rashes or bruises, No new joints pains-aches,  No new weakness, tingling, numbness in any extremity, No recent weight gain or loss, No polyuria, polydypsia or polyphagia, No significant Mental Stressors.  A full 10 point Review of Systems was done, except as stated above, all  other Review of Systems were negative.   With Past History of the following :    Past Medical History:  Diagnosis Date  . Alcohol abuse    /notes 01/18/2017  . Alcohol related seizure ([place]) 01/17/2017   Archie Endo 01/17/2017  . Anxiety   . Depression   . GERD (gastroesophageal reflux disease)   . Migraine    frequency "depends on the weather" (01/19/2017)  . Panic attacks   . Pneumonia       Past Surgical History:  Procedure Laterality Date  . ANTERIOR CERVICAL DECOMP/DISCECTOMY FUSION    . BACK SURGERY        Social History:     Social History   Tobacco Use  . Smoking status: Former Smoker    Packs/day: 1.00    Years: 23.00    Pack years: 23.00    Types: Cigarettes    Last attempt to quit: 12/28/2016    Years  since quitting: 0.3  . Smokeless tobacco: Never Used  Substance Use Topics  . Alcohol use: Yes    Alcohol/week: 7.2 oz    Types: 12 Cans of beer per week     Lives - at home by self   Mobility - walks by self     Family History :     Family History  Problem Relation Age of Onset  . Diabetes Mother   . Depression Mother   . Hypertension Mother       Home Medications:   Prior to Admission medications   Medication Sig Start Date End Date Taking? Authorizing Provider  acetaminophen (TYLENOL) 500 MG tablet Take 500-1,000 mg by mouth every 6 (six) hours as needed for headache (pain).   Yes [provider]  clonazePAM (KLONOPIN) 1 MG tablet Take 0.5-1 mg by mouth 3 (three) times daily as needed for anxiety.   Yes [provider]  OVER THE COUNTER MEDICATION Take 1 tablet by mouth daily. Vitamin B 50 mg   Yes [provider]  dicyclomine (BENTYL) 10 MG capsule TAKE 1 CAPSULE BY MOUTH THREE TIMES A DAY BEFORE MEALS Patient not taking: Reported on 05/20/2017 03/09/17   Scot Jun, FNP  dicyclomine (BENTYL) 10 MG capsule TAKE 1 CAPSULE BY MOUTH THREE TIMES A DAY BEFORE MEALS Patient taking differently: TAKE 1 CAPSULE  (10 MG) BY MOUTH THREE TIMES A DAY BEFORE MEALS 04/01/17   Scot Jun, FNP  FLUoxetine (PROZAC) 20 MG capsule Take 1 capsule (20 mg total) daily by mouth. 02/10/17   Scot Jun, FNP  folic acid (FOLVITE) 1 MG tablet Take 1 tablet (1 mg total) by mouth daily. 01/21/17   Lavina Hamman, MD  hydrOXYzine (ATARAX/VISTARIL) 50 MG tablet Take 1 tablet twice daily as needed for anxiety. Take 2 tablets at bedtime as needed for insomnia and anxiety. Patient taking differently: Take 50-100 mg by mouth See admin instructions. Take 1 tablet (50 mg) by mouth twice during the day as needed for anxiety, take 2 tablets (100 mg) at bedtime as needed for insomnia and anxiety 02/10/17   Scot Jun, FNP  thiamine 100 MG tablet Take 1 tablet (100 mg total) by mouth daily. 01/21/17   Lavina Hamman, MD  traZODone (DESYREL) 50 MG tablet Take 1-2 tablets (50-100 mg total) at bedtime as needed by mouth for sleep. 02/10/17   Scot Jun, FNP     Allergies:    No Known Allergies   Physical Exam:   Vitals  Blood pressure 118/70, pulse 96, temperature 97.8 F (36.6 C), temperature source Oral, resp. rate 18, height 5' 10"  (1.778 m), weight 95.3 kg (210 lb), SpO2 94 %.   1. General lying in bed in NAD,   2. Normal affect and insight, Not Suicidal or Homicidal, Awake Alert, Oriented X 3.  3. No F.N deficits, ALL C.Nerves Intact, Strength 5/5 all 4 extremities, Sensation intact all 4 extremities, Plantars down going.  4. Ears appear Normal, slight scleral icterus, PERRLA. Moist Oral Mucosa.  5. Supple Neck, No JVD, No cervical lymphadenopathy appriciated, No Carotid Bruits.  6. Symmetrical Chest wall movement, Good air movement bilaterally, CTAB.  7. RRR, No Gallops, Rubs or Murmurs, No Parasternal Heave.  8. Positive Bowel Sounds, Abdomen Soft, No tenderness, No organomegaly appriciated,No rebound -guarding or rigidity.  9.  No Cyanosis, Normal Skin Turgor, No Skin Rash or  Bruise.  10. Good muscle tone,  joints appear normal , no effusions,  Normal ROM.  11. No Palpable Lymph Nodes in Neck or Axillae    Data Review:    CBC Recent Labs  Lab 05/20/17 1445  WBC 12.1*  HGB 14.7  HCT 40.4  PLT 124*  MCV 96.7  MCH 35.2*  MCHC 36.4*  RDW 17.1*   ------------------------------------------------------------------------------------------------------------------  Chemistries  Recent Labs  Lab 05/20/17 1445  NA 134*  K 4.2  CL 101  CO2 19*  GLUCOSE 161*  BUN 6  CREATININE 1.08  CALCIUM 8.7*  AST 105*  ALT 33  ALKPHOS 191*  BILITOT 4.5*   ------------------------------------------------------------------------------------------------------------------ estimated creatinine clearance is 91.6 mL/min (by C-G formula based on SCr of 1.08 mg/dL). ------------------------------------------------------------------------------------------------------------------ No results for input(s): TSH, T4TOTAL, T3FREE, THYROIDAB in the last 72 hours.  Invalid input(s): FREET3  Coagulation profile No results for input(s): INR, PROTIME in the last 168 hours. ------------------------------------------------------------------------------------------------------------------- No results for input(s): DDIMER in the last 72 hours. -------------------------------------------------------------------------------------------------------------------  Cardiac Enzymes No results for input(s): CKMB, TROPONINI, MYOGLOBIN in the last 168 hours.  Invalid input(s): CK ------------------------------------------------------------------------------------------------------------------ No results found for: BNP   ---------------------------------------------------------------------------------------------------------------  Urinalysis    Component Value Date/Time   COLORURINE AMBER (A) 01/19/2017 0131   APPEARANCEUR CLEAR 01/19/2017 0131   LABSPEC 1.011 01/19/2017 0131    PHURINE 5.0 01/19/2017 0131   GLUCOSEU NEGATIVE 01/19/2017 0131   HGBUR SMALL (A) 01/19/2017 0131   BILIRUBINUR NEGATIVE 01/19/2017 0131   KETONESUR NEGATIVE 01/19/2017 0131   PROTEINUR NEGATIVE 01/19/2017 0131   NITRITE NEGATIVE 01/19/2017 0131   LEUKOCYTESUR NEGATIVE 01/19/2017 0131    ----------------------------------------------------------------------------------------------------------------   Imaging Results:    Dg Chest 2 View  Result Date: 05/20/2017 CLINICAL DATA:  Recent seizure activity EXAM: CHEST  2 VIEW COMPARISON:  01/17/2017 FINDINGS: Cardiac shadow is enlarged but stable. The lungs are well aerated bilaterally. No focal infiltrate or sizable effusion is seen. Lower thoracic compression deformity of uncertain age is noted. IMPRESSION: No acute abnormality noted. Likely chronic thoracic compression of in the lower thoracic spine. Electronically Signed   By: Inez Catalina M.D.   On: 05/20/2017 16:19   Ct Head Wo Contrast  Result Date: 05/20/2017 CLINICAL DATA:  Seizure and fall. EXAM: CT HEAD WITHOUT CONTRAST CT CERVICAL SPINE WITHOUT CONTRAST TECHNIQUE: Multidetector CT imaging of the head and cervical spine was performed following the standard protocol without intravenous contrast. Multiplanar CT image reconstructions of the cervical spine were also generated. COMPARISON:  CT scan of January 17, 2017. FINDINGS: CT HEAD FINDINGS Brain: No evidence of acute infarction, hemorrhage, hydrocephalus, extra-axial collection or mass lesion/mass effect. Vascular: No hyperdense vessel or unexpected calcification. Skull: Normal. Negative for fracture or focal lesion. Sinuses/Orbits: No acute finding. Other: None. CT CERVICAL SPINE FINDINGS Alignment: Normal. Skull base and vertebrae: No acute fracture. No primary bone lesion or focal pathologic process. Soft tissues and spinal canal: No prevertebral fluid or swelling. No visible canal hematoma. Disc levels: Status post surgical anterior  fusion of C5-6 and C6-7. Mild degenerative joint disease is seen involving the posterior facet joints bilaterally. Upper chest: Negative. Other: None. IMPRESSION: Normal head CT. Status post surgical anterior fusion of C5-6 and C6-7. No acute abnormality seen in the cervical spine. Electronically Signed   By: Marijo Conception, M.D.   On: 05/20/2017 16:45   Ct Cervical Spine Wo Contrast  Result Date: 05/20/2017 CLINICAL DATA:  Seizure and fall. EXAM: CT HEAD WITHOUT CONTRAST CT CERVICAL SPINE WITHOUT CONTRAST TECHNIQUE: Multidetector CT imaging of the head and cervical spine was performed  following the standard protocol without intravenous contrast. Multiplanar CT image reconstructions of the cervical spine were also generated. COMPARISON:  CT scan of January 17, 2017. FINDINGS: CT HEAD FINDINGS Brain: No evidence of acute infarction, hemorrhage, hydrocephalus, extra-axial collection or mass lesion/mass effect. Vascular: No hyperdense vessel or unexpected calcification. Skull: Normal. Negative for fracture or focal lesion. Sinuses/Orbits: No acute finding. Other: None. CT CERVICAL SPINE FINDINGS Alignment: Normal. Skull base and vertebrae: No acute fracture. No primary bone lesion or focal pathologic process. Soft tissues and spinal canal: No prevertebral fluid or swelling. No visible canal hematoma. Disc levels: Status post surgical anterior fusion of C5-6 and C6-7. Mild degenerative joint disease is seen involving the posterior facet joints bilaterally. Upper chest: Negative. Other: None. IMPRESSION: Normal head CT. Status post surgical anterior fusion of C5-6 and C6-7. No acute abnormality seen in the cervical spine. Electronically Signed   By: Marijo Conception, M.D.   On: 05/20/2017 16:45    Assessment & Plan:    Principal Problem:   Seizure ([place]) Active Problems:   Alcohol abuse   Thrombocytopenia ([place])   Etoh withdrawal seizure MRI brain EEG Neurology was consulted by ED, but didn't formally  consut  Etoh withdrawal Librium 1m po qday x 1 days then 284mo qday x 1 day CIWA  Thrombocytopenia Check cbc in am  Abnormal liver function Check acute hepatitis panel Recent ultrasound 12/2016=> negative for mass  DVT Prophylaxis - SCDs   AM Labs Ordered, also please review Full Orders  Family Communication: Admission, patients condition and plan of care including tests being ordered have been discussed with the patient  who indicate understanding and agree with the plan and Code Status.  Code Status FULL CODE  Likely DC to  home  Condition GUARDED    Consults called: neurology by ED, but no formal consult, need to contact neurology in [place] if want their input apparently  Admission status: inpatient  Time spent in minutes : 45   JaJani Gravel.D on 05/20/2017 at 7:02 PM  Between 7am to 7pm - Pager - [phone] 7pm go to [email or web address] - password [email or web address] - Office  [phone]

## 2017-05-21 ENCOUNTER — Inpatient Hospital Stay (HOSPITAL_COMMUNITY): Payer: PRIVATE HEALTH INSURANCE

## 2017-05-21 ENCOUNTER — Inpatient Hospital Stay (HOSPITAL_COMMUNITY): Payer: Self-pay

## 2017-05-21 DIAGNOSIS — F10239 Alcohol dependence with withdrawal, unspecified: Secondary | ICD-10-CM

## 2017-05-21 DIAGNOSIS — F322 Major depressive disorder, single episode, severe without psychotic features: Secondary | ICD-10-CM

## 2017-05-21 DIAGNOSIS — R569 Unspecified convulsions: Secondary | ICD-10-CM

## 2017-05-21 LAB — COMPREHENSIVE METABOLIC PANEL
ALK PHOS: 155 U/L — AB (ref 38–126)
ALT: 25 U/L (ref 17–63)
ANION GAP: 12 (ref 5–15)
AST: 75 U/L — ABNORMAL HIGH (ref 15–41)
Albumin: 2.7 g/dL — ABNORMAL LOW (ref 3.5–5.0)
BILIRUBIN TOTAL: 5.2 mg/dL — AB (ref 0.3–1.2)
BUN: 7 mg/dL (ref 6–20)
CALCIUM: 8.2 mg/dL — AB (ref 8.9–10.3)
CO2: 19 mmol/L — ABNORMAL LOW (ref 22–32)
CREATININE: 1.11 mg/dL (ref 0.61–1.24)
Chloride: 103 mmol/L (ref 101–111)
Glucose, Bld: 99 mg/dL (ref 65–99)
Potassium: 3.1 mmol/L — ABNORMAL LOW (ref 3.5–5.1)
Sodium: 134 mmol/L — ABNORMAL LOW (ref 135–145)
TOTAL PROTEIN: 6.7 g/dL (ref 6.5–8.1)

## 2017-05-21 LAB — CBC
HCT: 38.1 % — ABNORMAL LOW (ref 39.0–52.0)
HEMOGLOBIN: 13.4 g/dL (ref 13.0–17.0)
MCH: 34.6 pg — ABNORMAL HIGH (ref 26.0–34.0)
MCHC: 35.2 g/dL (ref 30.0–36.0)
MCV: 98.4 fL (ref 78.0–100.0)
PLATELETS: 71 10*3/uL — AB (ref 150–400)
RBC: 3.87 MIL/uL — AB (ref 4.22–5.81)
RDW: 17.4 % — ABNORMAL HIGH (ref 11.5–15.5)
WBC: 7.8 10*3/uL (ref 4.0–10.5)

## 2017-05-21 LAB — TROPONIN I
Troponin I: <0.03 ng/mL (ref <0.03)
Troponin I: <0.03 ng/mL (ref <0.03)

## 2017-05-21 MED ORDER — FOLIC ACID 1 MG PO TABS
1.0000 mg | ORAL_TABLET | Freq: Every day | ORAL | Status: DC
  Administered 2017-05-21 – 2017-05-22 (×2): 1 mg via ORAL
  Filled 2017-05-21 (×2): qty 1

## 2017-05-21 MED ORDER — VITAMIN B-1 100 MG PO TABS
100.0000 mg | ORAL_TABLET | Freq: Every day | ORAL | Status: DC
  Administered 2017-05-22 – 2017-05-23 (×2): 100 mg via ORAL
  Filled 2017-05-21 (×2): qty 1

## 2017-05-21 NOTE — Procedures (Signed)
ELECTROENCEPHALOGRAM REPORT  Date of Study: 05/21/2017  Patient's Name: Michael Mcfarland MRN: [id number] Date of Birth: 10-27-1965  Referring Provider: Dr. Jani Gravel  Clinical History: This is a 52 year old man with seizure.  Medications: Thiamine Folic acid Librium Proac Hydroxyzine  Technical Summary: A multichannel digital EEG recording measured by the international 10-20 system with electrodes applied with paste and impedances below 5000 ohms performed as portable with EKG monitoring in an awake and drowsy patient.  Hyperventilation was not performed. Photic stimulation was performed.  The digital EEG was referentially recorded, reformatted, and digitally filtered in a variety of bipolar and referential montages for optimal display.   Description: The patient is awake and drowsy during the recording.  During maximal wakefulness, there is a symmetric, medium voltage 7-7.5 Hz posterior dominant rhythm that attenuates with eye opening. The record is symmetric. During drowsiness, there is an increase in theta slowing of the background. Deeper stages of sleep were not seen. Photic stimulation did not elicit any abnormalities.  There were no epileptiform discharges or electrographic seizures seen.    EKG lead was unremarkable.  Impression: This awake and drowsy EEG is mildly abnormal due to slowing of the posterior dominant rhythm.   Clinical Correlation of the above findings indicates mild diffuse cerebral dysfunction that is non-specific in etiology and can be seen with hypoxic/ischemic injury, toxic/metabolic encephalopathies, neurodegenerative disorders, medication effect, or due to excessive drowsiness.  The absence of epileptiform discharges does not rule out a clinical diagnosis of epilepsy.  Clinical correlation is advised.   Ellouise Newer, M.D.

## 2017-05-21 NOTE — Progress Notes (Signed)
   05/21/17 1400  Clinical Encounter Type  Visited With Patient  Visit Type Follow-up  Referral From Nurse  Consult/Referral To Chaplain  Spiritual Encounters  Spiritual Needs Other (Comment) (none identified)  Stress Factors  Patient Stress Factors Health changes  Chaplain visited with the PT after rounding was completed in the AM.  The PT was not very responsive to [employer].  The PT was more concerned about trying to make it to the bathroom than he was in trying to speak with the Chaplain.

## 2017-05-21 NOTE — Progress Notes (Signed)
PROGRESS NOTE  ADAIN GEURIN EQA:[id number] DOB: 1966-02-22 DOA: 05/20/2017 PCP: Scot Jun, FNP  HPI/Recap of past 24 hours: Michael Mcfarland is a 53 y.o. male, w alcohol abuse, alcohol related seizure, apparently was found at home by son who witnessed seizure.  CT head unremarkable for any acute findings.  05/21/17: EEG showed no sign of seizure activity. Patient seen and examined at his bedside. He reports last alcohol use was 2 days ago. Also reports soreness in his upper arms.     Assessment/Plan: Principal Problem:   Seizure ([place]) Active Problems:   Alcohol abuse   Thrombocytopenia ([facility])  Suspected seizure 2/2 to alcohol withdrawal -EEG negative for seizure -seizure precaution -fall precaution -CIWA protocol  Abnormal liver enzymes -elevated ast and total bilirubin -most likely related to chronic alcohol use - repeat levels  Thrombocytopenia -plt 71 -no sign of bleeding -stable -monitor  Chronic Alcohol abuse -case manager to assist in program for detox -CIWA protocol  Depression/anxiety -prozac -trazodone    Code Status: full   Family Communication: none at bedside  Disposition Plan: to be determined    Consultants:  none  Procedures:  none  Antimicrobials:  none  DVT prophylaxis:  SCDs   Objective: Vitals:   05/20/17 1930 05/20/17 2033 05/21/17 0019 05/21/17 0425  BP: 123/81 121/84 107/66 (!) 106/53  Pulse: 90 96 84 88  Resp: 15 16 16 16   Temp:  98.4 F (36.9 C) 98.1 F (36.7 C) 98 F (36.7 C)  TempSrc:  Oral Oral Oral  SpO2: 95% 94% 95% 97%  Weight:      Height:        Intake/Output Summary (Last 24 hours) at 05/21/2017 1038 Last data filed at 05/21/2017 0426 Gross per 24 hour  Intake 1000 ml  Output 250 ml  Net 750 ml   Filed Weights   05/20/17 1443  Weight: 95.3 kg (210 lb)    Exam:   General:  52 yo CM WD wN NAD A&o x 3  Cardiovascular: RRR no rubs or gallops  Respiratory: CTA no wheezes or rales     Abdomen: soft NT ND NBS x4  Musculoskeletal: non focal no LE edema  Skin: upper extremities bruising and healed abrasion   Psychiatry: mood is appropriate for condition and setting    Data Reviewed: CBC: Recent Labs  Lab 05/20/17 1445  WBC 12.1*  HGB 14.7  HCT 40.4  MCV 96.7  PLT 979*   Basic Metabolic Panel: Recent Labs  Lab 05/20/17 1445  NA 134*  K 4.2  CL 101  CO2 19*  GLUCOSE 161*  BUN 6  CREATININE 1.08  CALCIUM 8.7*   GFR: Estimated Creatinine Clearance: 91.6 mL/min (by C-G formula based on SCr of 1.08 mg/dL). Liver Function Tests: Recent Labs  Lab 05/20/17 1445  AST 105*  ALT 33  ALKPHOS 191*  BILITOT 4.5*  PROT 7.2  ALBUMIN 3.0*   No results for input(s): LIPASE, AMYLASE in the last 168 hours. No results for input(s): AMMONIA in the last 168 hours. Coagulation Profile: No results for input(s): INR, PROTIME in the last 168 hours. Cardiac Enzymes: Recent Labs  Lab 05/20/17 2042 05/21/17 0222  TROPONINI 0.03* <0.03   BNP (last 3 results) No results for input(s): PROBNP in the last 8760 hours. HbA1C: No results for input(s): HGBA1C in the last 72 hours. CBG: Recent Labs  Lab 05/20/17 1445  GLUCAP 169*   Lipid Profile: No results for input(s): CHOL, HDL, LDLCALC, TRIG, CHOLHDL,  LDLDIRECT in the last 72 hours. Thyroid Function Tests: No results for input(s): TSH, T4TOTAL, FREET4, T3FREE, THYROIDAB in the last 72 hours. Anemia Panel: No results for input(s): VITAMINB12, FOLATE, FERRITIN, TIBC, IRON, RETICCTPCT in the last 72 hours. Urine analysis:    Component Value Date/Time   COLORURINE AMBER (A) 05/20/2017 1842   APPEARANCEUR CLOUDY (A) 05/20/2017 1842   LABSPEC 1.018 05/20/2017 1842   PHURINE 5.0 05/20/2017 1842   GLUCOSEU NEGATIVE 05/20/2017 1842   HGBUR SMALL (A) 05/20/2017 1842   BILIRUBINUR NEGATIVE 05/20/2017 1842   KETONESUR 5 (A) 05/20/2017 1842   PROTEINUR 30 (A) 05/20/2017 1842   NITRITE NEGATIVE 05/20/2017 1842    LEUKOCYTESUR NEGATIVE 05/20/2017 1842   Sepsis Labs: @LABRCNTIP (procalcitonin:4,lacticidven:4)  )No results found for this or any previous visit (from the past 240 hour(s)).    Studies: Dg Chest 2 View  Result Date: 05/20/2017 CLINICAL DATA:  Recent seizure activity EXAM: CHEST  2 VIEW COMPARISON:  01/17/2017 FINDINGS: Cardiac shadow is enlarged but stable. The lungs are well aerated bilaterally. No focal infiltrate or sizable effusion is seen. Lower thoracic compression deformity of uncertain age is noted. IMPRESSION: No acute abnormality noted. Likely chronic thoracic compression of in the lower thoracic spine. Electronically Signed   By: Inez Catalina M.D.   On: 05/20/2017 16:19   Ct Head Wo Contrast  Result Date: 05/20/2017 CLINICAL DATA:  Seizure and fall. EXAM: CT HEAD WITHOUT CONTRAST CT CERVICAL SPINE WITHOUT CONTRAST TECHNIQUE: Multidetector CT imaging of the head and cervical spine was performed following the standard protocol without intravenous contrast. Multiplanar CT image reconstructions of the cervical spine were also generated. COMPARISON:  CT scan of January 17, 2017. FINDINGS: CT HEAD FINDINGS Brain: No evidence of acute infarction, hemorrhage, hydrocephalus, extra-axial collection or mass lesion/mass effect. Vascular: No hyperdense vessel or unexpected calcification. Skull: Normal. Negative for fracture or focal lesion. Sinuses/Orbits: No acute finding. Other: None. CT CERVICAL SPINE FINDINGS Alignment: Normal. Skull base and vertebrae: No acute fracture. No primary bone lesion or focal pathologic process. Soft tissues and spinal canal: No prevertebral fluid or swelling. No visible canal hematoma. Disc levels: Status post surgical anterior fusion of C5-6 and C6-7. Mild degenerative joint disease is seen involving the posterior facet joints bilaterally. Upper chest: Negative. Other: None. IMPRESSION: Normal head CT. Status post surgical anterior fusion of C5-6 and C6-7. No acute  abnormality seen in the cervical spine. Electronically Signed   By: Marijo Conception, M.D.   On: 05/20/2017 16:45   Ct Cervical Spine Wo Contrast  Result Date: 05/20/2017 CLINICAL DATA:  Seizure and fall. EXAM: CT HEAD WITHOUT CONTRAST CT CERVICAL SPINE WITHOUT CONTRAST TECHNIQUE: Multidetector CT imaging of the head and cervical spine was performed following the standard protocol without intravenous contrast. Multiplanar CT image reconstructions of the cervical spine were also generated. COMPARISON:  CT scan of January 17, 2017. FINDINGS: CT HEAD FINDINGS Brain: No evidence of acute infarction, hemorrhage, hydrocephalus, extra-axial collection or mass lesion/mass effect. Vascular: No hyperdense vessel or unexpected calcification. Skull: Normal. Negative for fracture or focal lesion. Sinuses/Orbits: No acute finding. Other: None. CT CERVICAL SPINE FINDINGS Alignment: Normal. Skull base and vertebrae: No acute fracture. No primary bone lesion or focal pathologic process. Soft tissues and spinal canal: No prevertebral fluid or swelling. No visible canal hematoma. Disc levels: Status post surgical anterior fusion of C5-6 and C6-7. Mild degenerative joint disease is seen involving the posterior facet joints bilaterally. Upper chest: Negative. Other: None. IMPRESSION: Normal head CT. Status  post surgical anterior fusion of C5-6 and C6-7. No acute abnormality seen in the cervical spine. Electronically Signed   By: Marijo Conception, M.D.   On: 05/20/2017 16:45   Mr Brain Wo Contrast  Result Date: 05/21/2017 CLINICAL DATA:  Seizure.  Alcohol abuse. EXAM: MRI HEAD WITHOUT CONTRAST TECHNIQUE: Multiplanar, multiecho pulse sequences of the brain and surrounding structures were obtained without intravenous contrast. COMPARISON:  Head CT 05/20/2017 and MRI 02/15/2007 FINDINGS: Multiple sequences are mildly to moderately motion degraded. Brain: There is no evidence of acute infarct, intracranial hemorrhage, mass, midline  shift, or extra-axial fluid collection. Mild cerebral atrophy has progressed from 2008. No significant cerebral white matter disease is seen for age. Dedicated temporal lobe imaging is motion degraded which precludes detailed assessment of the hippocampi. Vascular: Major intracranial vascular flow voids are preserved. Skull and upper cervical spine: Unremarkable bone marrow signal. Sinuses/Orbits: Unremarkable orbits. Minimal scattered paranasal sinus mucosal thickening. Clear mastoid air cells. Other: None. IMPRESSION: 1. Motion degraded examination without evidence of acute intracranial abnormality. 2. Mild cerebral atrophy. Electronically Signed   By: Logan Bores M.D.   On: 05/21/2017 09:07    Scheduled Meds: . chlordiazePOXIDE  25 mg Oral QHS  . FLUoxetine  20 mg Oral Daily  . folic acid  1 mg Intravenous Daily  . thiamine  100 mg Intravenous Daily    Continuous Infusions:   LOS: 1 day     Kayleen Memos, MD [facility] Pager [phone]  If 7PM-7AM, please contact night-coverage [email or web address] Password [facility] 05/21/2017, 10:38 AM

## 2017-05-21 NOTE — Progress Notes (Signed)
CSW alerted by RN that patient's sister was at the nursing desk and requesting information on substance abuse treatment. CSW provided resource list for inpatient substance abuse treatment, and RNCM provided list to patient's sister. Per RNCM, patient's sister will work with the patient to obtain treatment at discharge.  CSW signing off.  Laveda Abbe, [facility] Clinical Social Worker [phone]

## 2017-05-21 NOTE — Progress Notes (Signed)
EEG Completed; Results Pending  

## 2017-05-22 DIAGNOSIS — D649 Anemia, unspecified: Secondary | ICD-10-CM

## 2017-05-22 DIAGNOSIS — L8990 Pressure ulcer of unspecified site, unspecified stage: Secondary | ICD-10-CM

## 2017-05-22 MED ORDER — METOCLOPRAMIDE HCL 5 MG/ML IJ SOLN
5.0000 mg | Freq: Once | INTRAMUSCULAR | Status: AC
  Administered 2017-05-22: 5 mg via INTRAVENOUS
  Filled 2017-05-22: qty 2

## 2017-05-22 MED ORDER — POTASSIUM CHLORIDE CRYS ER 20 MEQ PO TBCR
40.0000 meq | EXTENDED_RELEASE_TABLET | Freq: Three times a day (TID) | ORAL | Status: AC
  Administered 2017-05-22 (×3): 40 meq via ORAL
  Filled 2017-05-22 (×3): qty 2

## 2017-05-22 MED ORDER — MAGNESIUM SULFATE 2 GM/50ML IV SOLN
2.0000 g | Freq: Once | INTRAVENOUS | Status: AC
  Administered 2017-05-22: 2 g via INTRAVENOUS
  Filled 2017-05-22: qty 50

## 2017-05-22 MED ORDER — KETOROLAC TROMETHAMINE 15 MG/ML IJ SOLN
15.0000 mg | Freq: Once | INTRAMUSCULAR | Status: AC
  Administered 2017-05-22: 15 mg via INTRAVENOUS
  Filled 2017-05-22: qty 1

## 2017-05-22 MED ORDER — DIPHENHYDRAMINE HCL 50 MG/ML IJ SOLN
12.5000 mg | Freq: Once | INTRAMUSCULAR | Status: AC
  Administered 2017-05-22: 12.5 mg via INTRAVENOUS
  Filled 2017-05-22: qty 1

## 2017-05-22 NOTE — Progress Notes (Addendum)
PROGRESS NOTE  NASRI BOAKYE GYF:[id number] DOB: April 04, 1965 DOA: 05/20/2017 PCP: Scot Jun, FNP  HPI/Recap of past 24 hours: Nygel Prokop is a 52 y.o. male, w alcohol abuse, alcohol related seizure, apparently was found at home by son who witnessed seizure.  CT head unremarkable for any acute findings. 05/21/17: EEG showed no sign of seizure activity. He reports last alcohol use was 2 days ago.   05/22/17: no new complaints.  Assessment/Plan: Principal Problem:   Seizure ([place]) Active Problems:   Alcohol abuse   Thrombocytopenia ([facility])   Pressure injury of skin  Suspected seizure 2/2 to alcohol withdrawal -EEG negative for seizure -seizure precaution -fall precaution -05/22/17 continue CIWA protocol  Hx of alcohol abuse with concern for alcohol withdrawal -within the alcohol withdrawal window -continue CIWA protocol -continue thiamine  Abnormal liver enzymes -elevated ast and total bilirubin -most likely related to chronic alcohol use - repeat levels  Mild anion gap metabolic acidosis -Anion gap 12 -hco3- 19 -continue gentle IV fluid hydration -repeat chemistry panel am  Thrombocytopenia -plt 71 -no sign of bleeding -stable -monitor -CBC am  Chronic Alcohol abuse -case manager to assist in program for detox -CIWA protocol  Depression/anxiety -05/22/17 continue prozac -trazodone  Hypokalemia -k+ 3.1 -repletaed with K= supplement 40 meq x 3 and 2g IV magnesium -repeat chemistry panel in the am   Code Status: full   Family Communication: none at bedside  Disposition Plan: to be determined    Consultants:  none  Procedures:  none  Antimicrobials:  none  DVT prophylaxis:  SCDs   Objective: Vitals:   05/22/17 0048 05/22/17 0327 05/22/17 1147 05/22/17 1500  BP: 114/64 110/90 121/61 126/72  Pulse: 82 72 84 76  Resp: 19 18 16 16   Temp: 99.1 F (37.3 C) 98 F (36.7 C) 99.1 F (37.3 C) 98.8 F (37.1 C)  TempSrc: Oral Oral Oral  Oral  SpO2: 97% (!) 88% 93% 97%  Weight: 103.2 kg (227 lb 8.2 oz)     Height:        Intake/Output Summary (Last 24 hours) at 05/22/2017 1540 Last data filed at 05/22/2017 0352 Gross per 24 hour  Intake -  Output 325 ml  Net -325 ml   Filed Weights   05/20/17 1443 05/22/17 0048  Weight: 95.3 kg (210 lb) 103.2 kg (227 lb 8.2 oz)    Exam: 05/22/17 patient seen and examined at his bedside. Physical exam is unchanged from prior.   General:  52 yo CM WD wN NAD A&o x 3  Cardiovascular: RRR no rubs or gallops  Respiratory: CTA no wheezes or rales   Abdomen: soft NT ND NBS x4  Musculoskeletal: non focal no LE edema  Skin: upper extremities bruising and healed abrasion   Psychiatry: mood is appropriate for condition and setting    Data Reviewed: CBC: Recent Labs  Lab 05/20/17 1445 05/21/17 1019  WBC 12.1* 7.8  HGB 14.7 13.4  HCT 40.4 38.1*  MCV 96.7 98.4  PLT 124* 71*   Basic Metabolic Panel: Recent Labs  Lab 05/20/17 1445 05/21/17 1019  NA 134* 134*  K 4.2 3.1*  CL 101 103  CO2 19* 19*  GLUCOSE 161* 99  BUN 6 7  CREATININE 1.08 1.11  CALCIUM 8.7* 8.2*   GFR: Estimated Creatinine Clearance: 92.6 mL/min (by C-G formula based on SCr of 1.11 mg/dL). Liver Function Tests: Recent Labs  Lab 05/20/17 1445 05/21/17 1019  AST 105* 75*  ALT 33 25  ALKPHOS  191* 155*  BILITOT 4.5* 5.2*  PROT 7.2 6.7  ALBUMIN 3.0* 2.7*   No results for input(s): LIPASE, AMYLASE in the last 168 hours. No results for input(s): AMMONIA in the last 168 hours. Coagulation Profile: No results for input(s): INR, PROTIME in the last 168 hours. Cardiac Enzymes: Recent Labs  Lab 05/20/17 2042 05/21/17 0222 05/21/17 1019  TROPONINI 0.03* <0.03 <0.03   BNP (last 3 results) No results for input(s): PROBNP in the last 8760 hours. HbA1C: No results for input(s): HGBA1C in the last 72 hours. CBG: Recent Labs  Lab 05/20/17 1445  GLUCAP 169*   Lipid Profile: No results for  input(s): CHOL, HDL, LDLCALC, TRIG, CHOLHDL, LDLDIRECT in the last 72 hours. Thyroid Function Tests: No results for input(s): TSH, T4TOTAL, FREET4, T3FREE, THYROIDAB in the last 72 hours. Anemia Panel: No results for input(s): VITAMINB12, FOLATE, FERRITIN, TIBC, IRON, RETICCTPCT in the last 72 hours. Urine analysis:    Component Value Date/Time   COLORURINE AMBER (A) 05/20/2017 1842   APPEARANCEUR CLOUDY (A) 05/20/2017 1842   LABSPEC 1.018 05/20/2017 1842   PHURINE 5.0 05/20/2017 1842   GLUCOSEU NEGATIVE 05/20/2017 1842   HGBUR SMALL (A) 05/20/2017 1842   BILIRUBINUR NEGATIVE 05/20/2017 1842   KETONESUR 5 (A) 05/20/2017 1842   PROTEINUR 30 (A) 05/20/2017 1842   NITRITE NEGATIVE 05/20/2017 1842   LEUKOCYTESUR NEGATIVE 05/20/2017 1842   Sepsis Labs: @LABRCNTIP (procalcitonin:4,lacticidven:4)  )No results found for this or any previous visit (from the past 240 hour(s)).    Studies: No results found.  Scheduled Meds: . chlordiazePOXIDE  25 mg Oral QHS  . FLUoxetine  20 mg Oral Daily  . folic acid  1 mg Oral Daily  . potassium chloride  40 mEq Oral TID  . thiamine  100 mg Oral Daily    Continuous Infusions:   LOS: 2 days     Kayleen Memos, MD [facility] Pager [phone]  If 7PM-7AM, please contact night-coverage [email or web address] Password [facility] 05/22/2017, 3:40 PM

## 2017-05-23 DIAGNOSIS — F1010 Alcohol abuse, uncomplicated: Secondary | ICD-10-CM

## 2017-05-23 DIAGNOSIS — E876 Hypokalemia: Secondary | ICD-10-CM

## 2017-05-23 DIAGNOSIS — L89009 Pressure ulcer of unspecified elbow, unspecified stage: Secondary | ICD-10-CM

## 2017-05-23 DIAGNOSIS — D696 Thrombocytopenia, unspecified: Secondary | ICD-10-CM

## 2017-05-23 LAB — COMPREHENSIVE METABOLIC PANEL
ALBUMIN: 2.7 g/dL — AB (ref 3.5–5.0)
ALK PHOS: 186 U/L — AB (ref 38–126)
ALT: 24 U/L (ref 17–63)
AST: 73 U/L — ABNORMAL HIGH (ref 15–41)
Anion gap: 10 (ref 5–15)
BILIRUBIN TOTAL: 3.3 mg/dL — AB (ref 0.3–1.2)
BUN: 9 mg/dL (ref 6–20)
CALCIUM: 8.4 mg/dL — AB (ref 8.9–10.3)
CO2: 20 mmol/L — ABNORMAL LOW (ref 22–32)
CREATININE: 1.09 mg/dL (ref 0.61–1.24)
Chloride: 105 mmol/L (ref 101–111)
GFR calc Af Amer: >60 mL/min (ref >60)
GFR calc non Af Amer: >60 mL/min (ref >60)
Glucose, Bld: 102 mg/dL — ABNORMAL HIGH (ref 65–99)
Potassium: 3.5 mmol/L (ref 3.5–5.1)
Sodium: 135 mmol/L (ref 135–145)
TOTAL PROTEIN: 6.6 g/dL (ref 6.5–8.1)

## 2017-05-23 LAB — CBC
HEMATOCRIT: 37.8 % — AB (ref 39.0–52.0)
HEMOGLOBIN: 13.3 g/dL (ref 13.0–17.0)
MCH: 34.6 pg — AB (ref 26.0–34.0)
MCHC: 35.2 g/dL (ref 30.0–36.0)
MCV: 98.4 fL (ref 78.0–100.0)
Platelets: 74 10*3/uL — ABNORMAL LOW (ref 150–400)
RBC: 3.84 MIL/uL — AB (ref 4.22–5.81)
RDW: 17.4 % — ABNORMAL HIGH (ref 11.5–15.5)
WBC: 7 10*3/uL (ref 4.0–10.5)

## 2017-05-23 NOTE — Plan of Care (Signed)
  Progressing Education: Knowledge of General Education information will improve 05/23/2017 0504 - Progressing by Marcos Eke, RN Health Behavior/Discharge Planning: Ability to manage health-related needs will improve 05/23/2017 0504 - Progressing by Carin Hock I, RN Clinical Measurements: Ability to maintain clinical measurements within normal limits will improve 05/23/2017 0504 - Progressing by Carin Hock I, RN Will remain free from infection 05/23/2017 0504 - Progressing by Marcos Eke, RN Diagnostic test results will improve 05/23/2017 0504 - Progressing by Marcos Eke, RN Respiratory complications will improve 05/23/2017 0504 - Progressing by Carin Hock I, RN Cardiovascular complication will be avoided 05/23/2017 0504 - Progressing by Carin Hock I, RN Activity: Risk for activity intolerance will decrease 05/23/2017 0504 - Progressing by Carin Hock I, RN Nutrition: Adequate nutrition will be maintained 05/23/2017 0504 - Progressing by Carin Hock I, RN Coping: Level of anxiety will decrease 05/23/2017 0504 - Progressing by Carin Hock I, RN Elimination: Will not experience complications related to bowel motility 05/23/2017 0504 - Progressing by Carin Hock I, RN Will not experience complications related to urinary retention 05/23/2017 0504 - Progressing by Marcos Eke, RN Pain Managment: General experience of comfort will improve 05/23/2017 0504 - Progressing by Carin Hock I, RN Safety: Ability to remain free from injury will improve 05/23/2017 0504 - Progressing by Carin Hock I, RN Skin Integrity: Risk for impaired skin integrity will decrease 05/23/2017 0504 - Progressing by Marcos Eke, RN

## 2017-05-23 NOTE — Discharge Instructions (Addendum)
Alcohol Abuse and Nutrition Alcohol abuse is any pattern of alcohol consumption that harms your health, relationships, or work. Alcohol abuse can affect how your body breaks down and absorbs nutrients from food by causing your liver to work abnormally. Additionally, many people who abuse alcohol do not eat enough carbohydrates, protein, fat, vitamins, and minerals. This can cause poor nutrition (malnutrition) and a lack of nutrients (nutrient deficiencies), which can lead to further complications. Nutrients that are commonly lacking (deficient) among people who abuse alcohol include:  Vitamins. ? Vitamin A. This is stored in your liver. It is important for your vision, metabolism, and ability to fight off infections (immunity). ? B vitamins. These include vitamins such as folate, thiamin, and niacin. These are important in new cell growth and maintenance. ? Vitamin C. This plays an important role in iron absorption, wound healing, and immunity. ? Vitamin D. This is produced by your liver, but you can also get vitamin D from food. Vitamin D is necessary for your body to absorb and use calcium.  Minerals. ? Calcium. This is important for your bones and your heart and blood vessel (cardiovascular) function. ? Iron. This is important for blood, muscle, and nervous system functioning. ? Magnesium. This plays an important role in muscle and nerve function, and it helps to control blood sugar and blood pressure. ? Zinc. This is important for the normal function of your nervous system and digestive system (gastrointestinal tract).  Nutrition is an essential component of therapy for alcohol abuse. Your health care provider or dietitian will work with you to design a plan that can help restore nutrients to your body and prevent potential complications. What is my plan? Your dietitian may develop a specific diet plan that is based on your condition and any other complications you may have. A diet plan will  commonly include:  A balanced diet. ? Grains: 6-8 oz per day. ? Vegetables: 2-3 cups per day. ? Fruits: 1-2 cups per day. ? Meat and other protein: 5-6 oz per day. ? Dairy: 2-3 cups per day.  Vitamin and mineral supplements.  What do I need to know about alcohol and nutrition?  Consume foods that are high in antioxidants, such as grapes, berries, nuts, green tea, and dark green and orange vegetables. This can help to counteract some of the stress that is placed on your liver by consuming alcohol.  Avoid food and drinks that are high in fat and sugar. Foods such as sugared soft drinks, salty snack foods, and candy contain empty calories. This means that they lack important nutrients such as protein, fiber, and vitamins.  Eat frequent meals and snacks. Try to eat 5-6 small meals each day.  Eat a variety of fresh fruits and vegetables each day. This will help you get plenty of water, fiber, and vitamins in your diet.  Drink plenty of water and other clear fluids. Try to drink at least 48-64 oz (1.5-2 L) of water per day.  If you are a vegetarian, eat a variety of protein-rich foods. Pair whole grains with plant-based proteins at meals and snacks to obtain the greatest nutrient benefit from your food. For example, eat rice with beans, put peanut butter on whole-grain toast, or eat oatmeal with sunflower seeds.  Soak beans and whole grains overnight before cooking. This can help your body to absorb the nutrients more easily.  Include foods fortified with vitamins and minerals in your diet. Commonly fortified foods include milk, orange juice, cereal, and bread.  If you are malnourished, your dietitian may recommend a high-protein, high-calorie diet. This may include: ? 2,000-3,000 calories (kilocalories) per day. ? 70-100 grams of protein per day.  Your health care provider may recommend a complete nutritional supplement beverage. This can help to restore calories, protein, and vitamins to  your body. Depending on your condition, you may be advised to consume this instead of or in addition to meals.  Limit your intake of caffeine. Replace drinks like coffee and black tea with decaffeinated coffee and herbal tea.  Eat a variety of foods that are high in omega fatty acids. These include fish, nuts and seeds, and soybeans. These foods may help your liver to recover and may also stabilize your mood.  Certain medicines may cause changes in your appetite, taste, and weight. Work with your health care provider and dietitian to make any adjustments to your medicines and diet plan.  Include other healthy lifestyle choices in your daily routine. ? Be physically active. ? Get enough sleep. ? Spend time doing activities that you enjoy.  If you are unable to take in enough food and calories by mouth, your health care provider may recommend a feeding tube. This is a tube that passes through your nose and throat, directly into your stomach. Nutritional supplement beverages can be given to you through the feeding tube to help you get the nutrients you need.  Take vitamin or mineral supplements as recommended by your health care provider. What foods can I eat? Grains Enriched pasta. Enriched rice. Fortified whole-grain bread. Fortified whole-grain cereal. Barley. Brown rice. Quinoa. [place]. Vegetables All fresh, frozen, and canned vegetables. Spinach. Kale. Artichoke. Carrots. Winter squash and pumpkin. Sweet potatoes. Broccoli. Cabbage. Cucumbers. Tomatoes. Sweet peppers. Green beans. Peas. Corn. Fruits All fresh and frozen fruits. Berries. Grapes. Mango. Papaya. Guava. Cherries. Apples. Bananas. Peaches. Plums. Pineapple. Watermelon. Cantaloupe. Oranges. Avocado. Meats and Other Protein Sources Beef liver. Lean beef. Pork. Fresh and canned chicken. Fresh fish. Oysters. Sardines. Canned tuna. Shrimp. Eggs with yolks. Nuts and seeds. Peanut butter. Beans and lentils. Soybeans.  Tofu. Dairy Whole, low-fat, and nonfat milk. Whole, low-fat, and nonfat yogurt. Cottage cheese. Sour cream. Hard and soft cheeses. Beverages Water. Herbal tea. Decaffeinated coffee. Decaffeinated green tea. 100% fruit juice. 100% vegetable juice. Instant breakfast shakes. Condiments Ketchup. Mayonnaise. Mustard. Salad dressing. Barbecue sauce. Sweets and Desserts Sugar-free ice cream. Sugar-free pudding. Sugar-free gelatin. Fats and Oils Butter. Vegetable oil, flaxseed oil, olive oil, and walnut oil. Other Complete nutrition shakes. Protein bars. Sugar-free gum. The items listed above may not be a complete list of recommended foods or beverages. Contact your dietitian for more options. What foods are not recommended? Grains Sugar-sweetened breakfast cereals. Flavored instant oatmeal. Fried breads. Vegetables Breaded or deep-fried vegetables. Fruits Dried fruit with added sugar. Candied fruit. Canned fruit in syrup. Meats and Other Protein Sources Breaded or deep-fried meats. Dairy Flavored milks. Fried cheese curds or fried cheese sticks. Beverages Alcohol. Sugar-sweetened soft drinks. Sugar-sweetened tea. Caffeinated coffee and tea. Condiments Sugar. Honey. Agave nectar. Molasses. Sweets and Desserts Chocolate. Cake. Cookies. Candy. Other Potato chips. Pretzels. Salted nuts. Candied nuts. The items listed above may not be a complete list of foods and beverages to avoid. Contact your dietitian for more information. This information is not intended to replace advice given to you by your health care provider. Make sure you discuss any questions you have with your health care provider. Document Released: 01/08/2005 Document Revised: 07/24/2015 Document Reviewed: 10/17/2013 Elsevier Interactive Patient Education  2018 [employer] ABSTAIN FOR USING ALCOHOL. DO NOT DRIVE FOR AT LEAST 6 MONTHS UNTIL CLEARED BY A NEUROLOGIST.

## 2017-05-23 NOTE — Progress Notes (Signed)
Pt had uneventful night, no seizure activity.

## 2017-05-23 NOTE — Discharge Summary (Addendum)
Discharge Summary  ROBBY BULKLEY CBJ:[id number] DOB: 1965/08/06  PCP: Scot Jun, FNP  Admit date: 05/20/2017 Discharge date: 05/23/2017  Time spent: 25 minutes  Recommendations for Outpatient Follow-up:  1. Follow up with neurology 2. Do not drive for at least 6 months until cleared by a neurologist 3. Abstain from use of alcohol  Discharge Diagnoses:  Active Hospital Problems   Diagnosis Date Noted  . Seizure ([place])   . Pressure injury of skin 05/22/2017  . Alcohol abuse   . Thrombocytopenia [facility])     Resolved Hospital Problems  No resolved problems to display.    Discharge Condition: stable   Diet recommendation: resume previous diet   Vitals:   05/23/17 0445 05/23/17 0752  BP: 127/72 124/70  Pulse: 78 83  Resp: 18 16  Temp: 98.6 F (37 C) 98.7 F (37.1 C)  SpO2: 99% 98%    History of present illness:  G[place]aceis a52 y.o.male,w alcohol abuse, alcohol related seizure, apparentlywas found at home by son who witnessed seizure.  CT head unremarkable for any acute findings. 05/21/17: EEG showed no sign of seizure activity. He reports last alcohol use was 2 days ago.   CIWA protocol. Patient counseled on alcohol cessation.  On the day of discharge the patient was hemodynamically stable. Will need to follow up with neurology and abstain from alcohol. Patient instructed not to drive for at least 6 months until cleared by neurology. He understands and agrees to plan.     Hospital Course:  Principal Problem:   Seizure [facility]) Active Problems:   Alcohol abuse   Thrombocytopenia ([facility])   Pressure injury of skin  Suspected seizure 2/2 to alcohol withdrawal -EEG negative for seizure -seizure precaution -fall precaution -CIWA protocol  Hx of alcohol abuse with concern for alcohol withdrawal -completed the alcohol withdraw window -CIWA protocol -continue thiamine  Pressure injury of bilateral upper extremities from previous  fall -stable -wound care by nursing  Abnormal liver enzymes -elevated ast and total bilirubin -most likely related to chronic alcohol use  Mild anion gap metabolic acidosis, resolving -Anion gap 10 from 12 -hco3- 20 from 19  Thrombocytopenia, stable -plt 74 from 71 -no sign of bleeding -stable  Chronic Alcohol abuse -case manager provided information about detox programs -CIWA protocol  Depression/anxiety -05/23/17 continue prozac -trazodone  Hypokalemia, resolved -k+ 3.5 from 3.1 -repleted with K+ supplement 40 meq x 3 and 2g IV magnesium   Procedures:  EEG  Consultations:  neurology  Discharge Exam: BP 124/70 (BP Location: Right Arm)   Pulse 83   Temp 98.7 F (37.1 C) (Oral)   Resp 16   Ht 5\' 10"  (1.778 m)   Wt 103.8 kg (228 lb 12.8 oz)   SpO2 98%   BMI 32.83 kg/m   General: 52 yo CM WD WN NAD A&O  x3 Cardiovascular: RRR no rubs or gallops  Respiratory: CTA no wheezes or rales   Discharge Instructions You were cared for by a hospitalist during your hospital stay. If you have any questions about your discharge medications or the care you received while you were in the hospital after you are discharged, you can call the unit and asked to speak with the hospitalist on call if the hospitalist that took care of you is not available. Once you are discharged, your primary care physician will handle any further medical issues. Please note that NO REFILLS for any discharge medications will be authorized once you are discharged, as it is imperative that you  return to your primary care physician (or establish a relationship with a primary care physician if you do not have one) for your aftercare needs so that they can reassess your need for medications and monitor your lab values.   Allergies as of 05/23/2017   No Known Allergies     Medication List    TAKE these medications   acetaminophen 500 MG tablet Commonly known as:  TYLENOL Take 500-1,000 mg by  mouth every 6 (six) hours as needed for headache (pain).   clonazePAM 1 MG tablet Commonly known as:  KLONOPIN Take 0.5-1 mg by mouth 3 (three) times daily as needed for anxiety.   dicyclomine 10 MG capsule Commonly known as:  BENTYL TAKE 1 CAPSULE BY MOUTH THREE TIMES A DAY BEFORE MEALS What changed:  See the new instructions.   FLUoxetine 20 MG capsule Commonly known as:  PROZAC Take 1 capsule (20 mg total) daily by mouth.   folic acid 1 MG tablet Commonly known as:  FOLVITE Take 1 tablet (1 mg total) by mouth daily.   hydrOXYzine 50 MG tablet Commonly known as:  ATARAX/VISTARIL Take 1 tablet twice daily as needed for anxiety. Take 2 tablets at bedtime as needed for insomnia and anxiety. What changed:    how much to take  how to take this  when to take this  additional instructions   thiamine 100 MG tablet Take 1 tablet (100 mg total) by mouth daily.   traZODone 50 MG tablet Commonly known as:  DESYREL Take 1-2 tablets (50-100 mg total) at bedtime as needed by mouth for sleep.      No Known Allergies Follow-up Information    [facility] Follow up in 1 week(s).   Contact information: [address]     [address] [place] [address] [phone]       Scot Jun, FNP Follow up in 2 week(s).   Specialty:  Family Medicine Contact information: [place] [address] [phone]            The results of significant diagnostics from this hospitalization (including imaging, microbiology, ancillary and laboratory) are listed below for reference.    Significant Diagnostic Studies: Dg Chest 2 View  Result Date: 05/20/2017 CLINICAL DATA:  Recent seizure activity EXAM: CHEST  2 VIEW COMPARISON:  01/17/2017 FINDINGS: Cardiac shadow is enlarged but stable. The lungs are well aerated bilaterally. No focal infiltrate or sizable effusion is seen. Lower thoracic compression deformity of uncertain age is  noted. IMPRESSION: No acute abnormality noted. Likely chronic thoracic compression of in the lower thoracic spine. Electronically Signed   By: Inez Catalina M.D.   On: 05/20/2017 16:19   Ct Head Wo Contrast  Result Date: 05/20/2017 CLINICAL DATA:  Seizure and fall. EXAM: CT HEAD WITHOUT CONTRAST CT CERVICAL SPINE WITHOUT CONTRAST TECHNIQUE: Multidetector CT imaging of the head and cervical spine was performed following the standard protocol without intravenous contrast. Multiplanar CT image reconstructions of the cervical spine were also generated. COMPARISON:  CT scan of January 17, 2017. FINDINGS: CT HEAD FINDINGS Brain: No evidence of acute infarction, hemorrhage, hydrocephalus, extra-axial collection or mass lesion/mass effect. Vascular: No hyperdense vessel or unexpected calcification. Skull: Normal. Negative for fracture or focal lesion. Sinuses/Orbits: No acute finding. Other: None. CT CERVICAL SPINE FINDINGS Alignment: Normal. Skull base and vertebrae: No acute fracture. No primary bone lesion or focal pathologic process. Soft tissues and spinal canal: No prevertebral fluid or swelling. No visible canal  hematoma. Disc levels: Status post surgical anterior fusion of C5-6 and C6-7. Mild degenerative joint disease is seen involving the posterior facet joints bilaterally. Upper chest: Negative. Other: None. IMPRESSION: Normal head CT. Status post surgical anterior fusion of C5-6 and C6-7. No acute abnormality seen in the cervical spine. Electronically Signed   By: Marijo Conception, M.D.   On: 05/20/2017 16:45   Ct Cervical Spine Wo Contrast  Result Date: 05/20/2017 CLINICAL DATA:  Seizure and fall. EXAM: CT HEAD WITHOUT CONTRAST CT CERVICAL SPINE WITHOUT CONTRAST TECHNIQUE: Multidetector CT imaging of the head and cervical spine was performed following the standard protocol without intravenous contrast. Multiplanar CT image reconstructions of the cervical spine were also generated. COMPARISON:  CT scan of  January 17, 2017. FINDINGS: CT HEAD FINDINGS Brain: No evidence of acute infarction, hemorrhage, hydrocephalus, extra-axial collection or mass lesion/mass effect. Vascular: No hyperdense vessel or unexpected calcification. Skull: Normal. Negative for fracture or focal lesion. Sinuses/Orbits: No acute finding. Other: None. CT CERVICAL SPINE FINDINGS Alignment: Normal. Skull base and vertebrae: No acute fracture. No primary bone lesion or focal pathologic process. Soft tissues and spinal canal: No prevertebral fluid or swelling. No visible canal hematoma. Disc levels: Status post surgical anterior fusion of C5-6 and C6-7. Mild degenerative joint disease is seen involving the posterior facet joints bilaterally. Upper chest: Negative. Other: None. IMPRESSION: Normal head CT. Status post surgical anterior fusion of C5-6 and C6-7. No acute abnormality seen in the cervical spine. Electronically Signed   By: Marijo Conception, M.D.   On: 05/20/2017 16:45   Mr Brain Wo Contrast  Result Date: 05/21/2017 CLINICAL DATA:  Seizure.  Alcohol abuse. EXAM: MRI HEAD WITHOUT CONTRAST TECHNIQUE: Multiplanar, multiecho pulse sequences of the brain and surrounding structures were obtained without intravenous contrast. COMPARISON:  Head CT 05/20/2017 and MRI 02/15/2007 FINDINGS: Multiple sequences are mildly to moderately motion degraded. Brain: There is no evidence of acute infarct, intracranial hemorrhage, mass, midline shift, or extra-axial fluid collection. Mild cerebral atrophy has progressed from 2008. No significant cerebral white matter disease is seen for age. Dedicated temporal lobe imaging is motion degraded which precludes detailed assessment of the hippocampi. Vascular: Major intracranial vascular flow voids are preserved. Skull and upper cervical spine: Unremarkable bone marrow signal. Sinuses/Orbits: Unremarkable orbits. Minimal scattered paranasal sinus mucosal thickening. Clear mastoid air cells. Other: None.  IMPRESSION: 1. Motion degraded examination without evidence of acute intracranial abnormality. 2. Mild cerebral atrophy. Electronically Signed   By: Logan Bores M.D.   On: 05/21/2017 09:07    Microbiology: No results found for this or any previous visit (from the past 240 hour(s)).   Labs: Basic Metabolic Panel: Recent Labs  Lab 05/20/17 1445 05/21/17 1019 05/23/17 0527  NA 134* 134* 135  K 4.2 3.1* 3.5  CL 101 103 105  CO2 19* 19* 20*  GLUCOSE 161* 99 102*  BUN 6 7 9   CREATININE 1.08 1.11 1.09  CALCIUM 8.7* 8.2* 8.4*   Liver Function Tests: Recent Labs  Lab 05/20/17 1445 05/21/17 1019 05/23/17 0527  AST 105* 75* 73*  ALT 33 25 24  ALKPHOS 191* 155* 186*  BILITOT 4.5* 5.2* 3.3*  PROT 7.2 6.7 6.6  ALBUMIN 3.0* 2.7* 2.7*   No results for input(s): LIPASE, AMYLASE in the last 168 hours. No results for input(s): AMMONIA in the last 168 hours. CBC: Recent Labs  Lab 05/20/17 1445 05/21/17 1019 05/23/17 0527  WBC 12.1* 7.8 7.0  HGB 14.7 13.4 13.3  HCT 40.4  38.1* 37.8*  MCV 96.7 98.4 98.4  PLT 124* 71* 74*   Cardiac Enzymes: Recent Labs  Lab 05/20/17 2042 05/21/17 0222 05/21/17 1019  TROPONINI 0.03* <0.03 <0.03   BNP: BNP (last 3 results) No results for input(s): BNP in the last 8760 hours.  ProBNP (last 3 results) No results for input(s): PROBNP in the last 8760 hours.  CBG: Recent Labs  Lab 05/20/17 1445  GLUCAP 169*       Signed:  Kayleen Memos, MD [facility] 05/23/2017, 9:19 PM

## 2017-06-03 ENCOUNTER — Ambulatory Visit: Payer: Self-pay | Admitting: Family Medicine

## 2017-06-03 ENCOUNTER — Encounter: Payer: Self-pay | Admitting: Family Medicine

## 2017-06-03 VITALS — BP 136/84 | HR 78 | Temp 97.5°F | Ht 72.0 in | Wt 215.6 lb

## 2017-06-03 DIAGNOSIS — R4189 Other symptoms and signs involving cognitive functions and awareness: Secondary | ICD-10-CM

## 2017-06-03 DIAGNOSIS — K7460 Unspecified cirrhosis of liver: Secondary | ICD-10-CM

## 2017-06-03 DIAGNOSIS — F419 Anxiety disorder, unspecified: Secondary | ICD-10-CM

## 2017-06-03 DIAGNOSIS — R197 Diarrhea, unspecified: Secondary | ICD-10-CM | POA: Insufficient documentation

## 2017-06-03 DIAGNOSIS — R569 Unspecified convulsions: Secondary | ICD-10-CM

## 2017-06-03 DIAGNOSIS — F1010 Alcohol abuse, uncomplicated: Secondary | ICD-10-CM

## 2017-06-03 DIAGNOSIS — R531 Weakness: Secondary | ICD-10-CM

## 2017-06-03 DIAGNOSIS — R269 Unspecified abnormalities of gait and mobility: Secondary | ICD-10-CM | POA: Insufficient documentation

## 2017-06-03 DIAGNOSIS — F10230 Alcohol dependence with withdrawal, uncomplicated: Secondary | ICD-10-CM

## 2017-06-03 MED ORDER — HYDROXYZINE HCL 50 MG PO TABS: 50.0000 mg | ORAL_TABLET | Freq: Three times a day (TID) | ORAL | 3 refills | Status: DC | PRN

## 2017-06-03 MED ORDER — FOLIC ACID 1 MG PO TABS: 1.0000 mg | ORAL_TABLET | Freq: Every day | ORAL | 0 refills | Status: DC

## 2017-06-03 MED ORDER — THIAMINE HCL 100 MG PO TABS: 100.0000 mg | ORAL_TABLET | Freq: Every day | ORAL | 0 refills | Status: DC

## 2017-06-03 NOTE — Progress Notes (Signed)
Subjective:  Michael Mcfarland is a 52 y.o. male who presents today with a chief complaint of weakness and to establish care. History is provided by patient and his sister.   HPI:  Summary of recent hospitalization: Patient was admitted to the hospital on 05/20/2017 with seizure activity.  This was thought to be secondary to alcohol withdrawal.  He was monitored in the hospital with no further episodes of seizure.  Workup included negative EEG and brain MRI.  He was discharged from the hospital on 05/23/2017.  Seizures, generalized weakness, abnormal gait, cognitive slowing, new problems As noted above, patient was recently hospitalized for seizure thought to be secondary to alcohol withdrawal.  His sister is with him today and reports additional neurological symptoms including generalized weakness, unsteady gait, and slow thinking.  She has noticed a rapid decline over the past several years.  He has never followed up with neurology for any of these issues.  Liver cirrhosis, diarrhea, bowel incontinence, new problems Patient found to have liver cirrhosis on a ultrasound performed in October of last year.  Both patient and his sister were unaware of this diagnosis.  Patient sister is also particularly distressed that he has had several episodes of bowel incontinence.  Patient states that he is aware that he needs to go to the bathroom however is unable to make it in time.  He has had chronic diarrhea for several months.  Diarrhea is worse after eating greasy foods.  Anxiety, agoraphobia, hoarding disorder, new problems Since being discharged from the hospital, patient has been staying with his younger sister.  His older sister is with him here today and states that he is house was in an "unlivable."  Patient has had a long-standing history of anxiety.  Has previously been treated with benzodiazepine such as Klonopin.  Is also been on SSRI such as Prozac.  Currently not taking anything for his  anxiety.  Alcohol abuse, new problem Several year history of chronic alcohol abuse.  Patient reports that he has been sober for the past 2 weeks.  ROS: Per HPI, otherwise a complete review of systems was negative.   PMH:  The following were reviewed and entered/updated in epic: Past Medical History:  Diagnosis Date  . Alcohol abuse    /notes 01/18/2017  . Alcohol related seizure ([place]) 01/17/2017   Archie Endo 01/17/2017  . Anxiety   . Depression   . GERD (gastroesophageal reflux disease)   . Migraine    frequency "depends on the weather" (01/19/2017)  . Panic attacks   . Pneumonia   . Seizure ([place]) 05/20/2017 X 2   Patient Active Problem List   Diagnosis Date Noted  . Weakness 06/03/2017  . Gait abnormality 06/03/2017  . Anxiety 06/03/2017  . Diarrhea 06/03/2017  . Pressure injury of skin 05/22/2017  . AKI (acute kidney injury) ([place])   . Seizure ([place])   . Cirrhosis ([place])   . Syncope   . Alcohol abuse   . Thrombocytopenia ([place])   . Alcohol withdrawal seizure ([place]) 01/17/2017   Past Surgical History:  Procedure Laterality Date  . ANTERIOR CERVICAL DECOMP/DISCECTOMY FUSION    . BACK SURGERY      Family History  Problem Relation Age of Onset  . Diabetes Mother   . Depression Mother   . Hypertension Mother     Medications- reviewed and updated Current Outpatient Medications  Medication Sig Dispense Refill  . folic acid (FOLVITE) 1 MG tablet Take 1 tablet (1 mg total) by  mouth daily. 30 tablet 0  . hydrOXYzine (ATARAX/VISTARIL) 50 MG tablet Take 1 tablet (50 mg total) by mouth 3 (three) times daily as needed for anxiety (insomina). 90 tablet 3  . thiamine 100 MG tablet Take 1 tablet (100 mg total) by mouth daily. 30 tablet 0   No current facility-administered medications for this visit.     Allergies-reviewed and updated No Known Allergies  Social History   Socioeconomic History  . Marital status: Widowed    Spouse name: None  . Number of children: None  .  Years of education: None  . Highest education level: None  Social Needs  . Financial resource strain: None  . Food insecurity - worry: None  . Food insecurity - inability: None  . Transportation needs - medical: None  . Transportation needs - non-medical: None  Occupational History  . None  Tobacco Use  . Smoking status: Current Every Day Smoker    Packs/day: 0.25    Years: 35.00    Pack years: 8.75    Types: Cigarettes    Last attempt to quit: 12/28/2016    Years since quitting: 0.4  . Smokeless tobacco: Never Used  Substance and Sexual Activity  . Alcohol use: Yes    Alcohol/week: 8.4 oz    Types: 14 Cans of beer per week    Comment: 05/20/2017 "couple beers to help me sleep q night"  . Drug use: No    Comment: 05/20/2017 "been a long time; I tried different things when I was younger"  . Sexual activity: Not Currently  Other Topics Concern  . None  Social History Narrative  . None   Objective:  Physical Exam: BP 136/84 (BP Location: Left Arm, Patient Position: Sitting, Cuff Size: Normal)   Pulse 78   Temp (!) 97.5 F (36.4 C) (Oral)   Ht 6' (1.829 m)   Wt 215 lb 9.6 oz (97.8 kg)   SpO2 97%   BMI 29.24 kg/m   Gen: NAD, resting comfortably CV: RRR with no murmurs appreciated Pulm: NWOB, CTAB with no crackles, wheezes, or rhonchi GI: Normal bowel sounds present. Soft, Nontender, Nondistended. MSK: No edema, cyanosis, or clubbing noted Skin: Warm, dry Neuro: Cranial nerves II through XII intact.  Strength 4+ out of 5 in right upper extremity.  Strength 5 out of 5 otherwise. nose-finger intact however very slow.  Reflexes 2+ and symmetric bilaterally.  Patient with 1 out of 3 words on delayed word recall.  Please see below picture for his clock face for "10 after 2" Psych: Normal affect and thought content    Review/summary of prior workup: CMET 05/23/2017: Sodium 135, potassium 3.5, heart rate 105, bicarb 20, glucose 102, BUN 9, creatinine 1.09, calcium 8.4, alk phos  186,Albumin 2.7, AST 73, ALT 24, total bilirubin 3.3 CBC 05/23/2017: WBC 7, hemoglobin 13.3, platelets 74  Hepatic ultrasound 01/18/2017: Cirrhotic appearing liver.   Assessment/Plan:  Cognitive impairment, gait abnormality, generalized weakness, seizures All likely secondary to alcohol abuse.  We will check for reversible causes of his cognitive impairment.  Check CMET, CBC, TSH, RPR, and vitamin B12 levels.  Will place referral to neurology for further evaluation.  Cirrhosis, diarrhea We will place GI referral for endoscopic evaluation to evaluate for varices.  Will check hepatitis C and hepatitis B titers.  In addition to the above labs, we will also check an INR to assess his liver function.  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  Will defer further  management to GI.  Anxiety, agoraphobia Start hydroxyzine 50 mg 3 times daily as needed.  Will avoid benzos given his history of alcohol abuse.  Referral placed to psychiatry.  Alcohol abuse Encouraged continued cessation.  As noted above, we will refer him to psychiatry.  Prescribed folate and thiamine.  Encourage patient to take daily.  Patient will follow-up with me in a couple of weeks.  Patient has a HIGH level of medical complexity due to number of diagnoses/treatment options and amount/complexity of data reviewed.   Algis Greenhouse. [name], MD 06/03/2017 5:37 PM

## 2017-06-03 NOTE — Patient Instructions (Addendum)
We need to check blood work.  I will send in three referrals.  Please take the folate and thiamine.  Use hydroxyzine as needed for your anxiety.   Come back to see me in a couple weeks, or sooner as needed.  Take care, Dr [name]

## 2017-06-04 LAB — HEPATITIS B SURFACE ANTIBODY, QUANTITATIVE

## 2017-06-04 LAB — CBC
HCT: 46 % (ref 39.0–52.0)
Hemoglobin: 15.6 g/dL (ref 13.0–17.0)
MCHC: 34 g/dL (ref 30.0–36.0)
MCV: 103 fl — ABNORMAL HIGH (ref 78.0–100.0)
Platelets: 135 K/uL — ABNORMAL LOW (ref 150.0–400.0)
RBC: 4.47 Mil/uL (ref 4.22–5.81)
RDW: 17.6 % — ABNORMAL HIGH (ref 11.5–15.5)
WBC: 7.7 K/uL (ref 4.0–10.5)

## 2017-06-04 LAB — HEPATITIS C ANTIBODY
Hepatitis C Ab: NONREACTIVE
SIGNAL TO CUT-OFF: 0.06 (ref <1.00)

## 2017-06-04 LAB — COMPREHENSIVE METABOLIC PANEL
ALK PHOS: 268 U/L — AB (ref 39–117)
ALT: 48 U/L (ref 0–53)
AST: 108 U/L — ABNORMAL HIGH (ref 0–37)
Albumin: 3.6 g/dL (ref 3.5–5.2)
BUN: 8 mg/dL (ref 6–23)
CHLORIDE: 102 meq/L (ref 96–112)
CO2: 25 mEq/L (ref 19–32)
Calcium: 9.8 mg/dL (ref 8.4–10.5)
Creatinine, Ser: 0.85 mg/dL (ref 0.40–1.50)
GFR: 100.03 mL/min (ref >60.00)
GLUCOSE: 90 mg/dL (ref 70–99)
POTASSIUM: 4.2 meq/L (ref 3.5–5.1)
SODIUM: 138 meq/L (ref 135–145)
TOTAL PROTEIN: 7.6 g/dL (ref 6.0–8.3)
Total Bilirubin: 2.6 mg/dL — ABNORMAL HIGH (ref 0.2–1.2)

## 2017-06-04 LAB — TSH: TSH: 3.16 u[IU]/mL (ref 0.35–4.50)

## 2017-06-04 LAB — VITAMIN B12: VITAMIN B 12: 1330 pg/mL — AB (ref 211–911)

## 2017-06-04 LAB — RPR: RPR Ser Ql: NONREACTIVE

## 2017-06-04 LAB — PROTIME-INR
INR: 1.3 — AB
Prothrombin Time: 14.2 s — ABNORMAL HIGH (ref 9.0–11.5)

## 2017-06-04 LAB — HEPATITIS B SURFACE ANTIGEN: Hepatitis B Surface Ag: NONREACTIVE

## 2017-06-14 ENCOUNTER — Telehealth: Payer: Self-pay | Admitting: *Deleted

## 2017-06-14 NOTE — Telephone Encounter (Signed)
Copied from [place] [id number]. Topic: Inquiry >> Jun 14, 2017  9:53 AM Conception Chancy, NT wrote: Patient sister [name] is calling and states the patient has cirrhosis. She states they are selling his house and she is looking into getting him an appt. She states sometimes the patient is confused when she talks to him and sometimes he just paces the floor. She states she would like a call back from Dr. [name] or his CMA for guidance. Should she get him an apartment or maybe assisted living/ please contact Mrs. Brumley.

## 2017-06-15 ENCOUNTER — Emergency Department (HOSPITAL_COMMUNITY): Payer: Self-pay

## 2017-06-15 ENCOUNTER — Encounter: Payer: Self-pay | Admitting: Internal Medicine

## 2017-06-15 ENCOUNTER — Inpatient Hospital Stay (HOSPITAL_COMMUNITY)
Admission: EM | Admit: 2017-06-15 | Discharge: 2017-06-22 | DRG: 433 | Disposition: A | Discharge status: Home Health | Payer: Self-pay | Attending: Internal Medicine | Admitting: Internal Medicine

## 2017-06-15 ENCOUNTER — Encounter (HOSPITAL_COMMUNITY): Payer: Self-pay | Admitting: *Deleted

## 2017-06-15 ENCOUNTER — Other Ambulatory Visit: Payer: Self-pay

## 2017-06-15 DIAGNOSIS — K7290 Hepatic failure, unspecified without coma: Secondary | ICD-10-CM | POA: Diagnosis present

## 2017-06-15 DIAGNOSIS — Z6829 Body mass index (BMI) 29.0-29.9, adult: Secondary | ICD-10-CM

## 2017-06-15 DIAGNOSIS — K219 Gastro-esophageal reflux disease without esophagitis: Secondary | ICD-10-CM | POA: Diagnosis present

## 2017-06-15 DIAGNOSIS — F410 Panic disorder [episodic paroxysmal anxiety] without agoraphobia: Secondary | ICD-10-CM | POA: Diagnosis present

## 2017-06-15 DIAGNOSIS — F329 Major depressive disorder, single episode, unspecified: Secondary | ICD-10-CM | POA: Diagnosis present

## 2017-06-15 DIAGNOSIS — K3189 Other diseases of stomach and duodenum: Secondary | ICD-10-CM | POA: Diagnosis present

## 2017-06-15 DIAGNOSIS — Z8673 Personal history of transient ischemic attack (TIA), and cerebral infarction without residual deficits: Secondary | ICD-10-CM

## 2017-06-15 DIAGNOSIS — Z818 Family history of other mental and behavioral disorders: Secondary | ICD-10-CM

## 2017-06-15 DIAGNOSIS — K7200 Acute and subacute hepatic failure without coma: Secondary | ICD-10-CM | POA: Diagnosis present

## 2017-06-15 DIAGNOSIS — G9341 Metabolic encephalopathy: Secondary | ICD-10-CM

## 2017-06-15 DIAGNOSIS — R4189 Other symptoms and signs involving cognitive functions and awareness: Secondary | ICD-10-CM | POA: Diagnosis present

## 2017-06-15 DIAGNOSIS — K7030 Alcoholic cirrhosis of liver without ascites: Secondary | ICD-10-CM | POA: Diagnosis present

## 2017-06-15 DIAGNOSIS — G43909 Migraine, unspecified, not intractable, without status migrainosus: Secondary | ICD-10-CM | POA: Diagnosis present

## 2017-06-15 DIAGNOSIS — R7881 Bacteremia: Secondary | ICD-10-CM

## 2017-06-15 DIAGNOSIS — J439 Emphysema, unspecified: Secondary | ICD-10-CM | POA: Diagnosis present

## 2017-06-15 DIAGNOSIS — F4000 Agoraphobia, unspecified: Secondary | ICD-10-CM | POA: Diagnosis present

## 2017-06-15 DIAGNOSIS — R531 Weakness: Secondary | ICD-10-CM

## 2017-06-15 DIAGNOSIS — F17210 Nicotine dependence, cigarettes, uncomplicated: Secondary | ICD-10-CM | POA: Diagnosis present

## 2017-06-15 DIAGNOSIS — K7460 Unspecified cirrhosis of liver: Secondary | ICD-10-CM

## 2017-06-15 DIAGNOSIS — Z720 Tobacco use: Secondary | ICD-10-CM | POA: Diagnosis present

## 2017-06-15 DIAGNOSIS — G9340 Encephalopathy, unspecified: Secondary | ICD-10-CM

## 2017-06-15 DIAGNOSIS — K529 Noninfective gastroenteritis and colitis, unspecified: Secondary | ICD-10-CM | POA: Diagnosis present

## 2017-06-15 DIAGNOSIS — Z8379 Family history of other diseases of the digestive system: Secondary | ICD-10-CM

## 2017-06-15 DIAGNOSIS — K7682 Hepatic encephalopathy: Secondary | ICD-10-CM | POA: Diagnosis present

## 2017-06-15 DIAGNOSIS — F423 Hoarding disorder: Secondary | ICD-10-CM | POA: Diagnosis present

## 2017-06-15 DIAGNOSIS — Z981 Arthrodesis status: Secondary | ICD-10-CM

## 2017-06-15 DIAGNOSIS — R269 Unspecified abnormalities of gait and mobility: Secondary | ICD-10-CM | POA: Diagnosis present

## 2017-06-15 DIAGNOSIS — D689 Coagulation defect, unspecified: Secondary | ICD-10-CM | POA: Diagnosis present

## 2017-06-15 DIAGNOSIS — I864 Gastric varices: Secondary | ICD-10-CM

## 2017-06-15 DIAGNOSIS — D696 Thrombocytopenia, unspecified: Secondary | ICD-10-CM | POA: Diagnosis present

## 2017-06-15 DIAGNOSIS — F1020 Alcohol dependence, uncomplicated: Secondary | ICD-10-CM | POA: Diagnosis present

## 2017-06-15 DIAGNOSIS — K766 Portal hypertension: Secondary | ICD-10-CM | POA: Diagnosis present

## 2017-06-15 DIAGNOSIS — E669 Obesity, unspecified: Secondary | ICD-10-CM | POA: Diagnosis present

## 2017-06-15 DIAGNOSIS — K7040 Alcoholic hepatic failure without coma: Principal | ICD-10-CM | POA: Diagnosis present

## 2017-06-15 DIAGNOSIS — I8510 Secondary esophageal varices without bleeding: Secondary | ICD-10-CM

## 2017-06-15 DIAGNOSIS — F1010 Alcohol abuse, uncomplicated: Secondary | ICD-10-CM

## 2017-06-15 HISTORY — DX: Alcoholic cirrhosis of liver without ascites: K70.30

## 2017-06-15 LAB — BASIC METABOLIC PANEL
Anion gap: 8 (ref 5–15)
BUN: 7 mg/dL (ref 6–20)
CALCIUM: 9.3 mg/dL (ref 8.9–10.3)
CO2: 24 mmol/L (ref 22–32)
CREATININE: 1.01 mg/dL (ref 0.61–1.24)
Chloride: 105 mmol/L (ref 101–111)
GFR calc non Af Amer: >60 mL/min (ref >60)
GLUCOSE: 121 mg/dL — AB (ref 65–99)
Potassium: 4.3 mmol/L (ref 3.5–5.1)
Sodium: 137 mmol/L (ref 135–145)

## 2017-06-15 LAB — URINALYSIS, ROUTINE W REFLEX MICROSCOPIC
Bilirubin Urine: NEGATIVE
GLUCOSE, UA: NEGATIVE mg/dL
HGB URINE DIPSTICK: NEGATIVE
Ketones, ur: NEGATIVE mg/dL
LEUKOCYTES UA: NEGATIVE
Nitrite: NEGATIVE
PROTEIN: NEGATIVE mg/dL
SPECIFIC GRAVITY, URINE: 1.006 (ref 1.005–1.030)
pH: 5 (ref 5.0–8.0)

## 2017-06-15 LAB — HEPATIC FUNCTION PANEL
ALBUMIN: 3.1 g/dL — AB (ref 3.5–5.0)
ALK PHOS: 192 U/L — AB (ref 38–126)
ALT: 26 U/L (ref 17–63)
AST: 60 U/L — ABNORMAL HIGH (ref 15–41)
BILIRUBIN INDIRECT: 1.6 mg/dL — AB (ref 0.3–0.9)
Bilirubin, Direct: 0.6 mg/dL — ABNORMAL HIGH (ref 0.1–0.5)
TOTAL PROTEIN: 7.2 g/dL (ref 6.5–8.1)
Total Bilirubin: 2.2 mg/dL — ABNORMAL HIGH (ref 0.3–1.2)

## 2017-06-15 LAB — CBC
HCT: 43.1 % (ref 39.0–52.0)
HEMOGLOBIN: 15.1 g/dL (ref 13.0–17.0)
MCH: 34.2 pg — AB (ref 26.0–34.0)
MCHC: 35 g/dL (ref 30.0–36.0)
MCV: 97.5 fL (ref 78.0–100.0)
Platelets: 109 10*3/uL — ABNORMAL LOW (ref 150–400)
RBC: 4.42 MIL/uL (ref 4.22–5.81)
RDW: 15.5 % (ref 11.5–15.5)
WBC: 6.7 10*3/uL (ref 4.0–10.5)

## 2017-06-15 LAB — ACETAMINOPHEN LEVEL: Acetaminophen (Tylenol), Serum: <10 ug/mL — ABNORMAL LOW (ref 10–30)

## 2017-06-15 LAB — RAPID URINE DRUG SCREEN, HOSP PERFORMED
AMPHETAMINES: NOT DETECTED
Barbiturates: NOT DETECTED
Benzodiazepines: POSITIVE — AB
COCAINE: NOT DETECTED
OPIATES: NOT DETECTED
TETRAHYDROCANNABINOL: NOT DETECTED

## 2017-06-15 LAB — PROTIME-INR
INR: 1.44
Prothrombin Time: 17.4 seconds — ABNORMAL HIGH (ref 11.4–15.2)

## 2017-06-15 LAB — ETHANOL: Alcohol, Ethyl (B): <10 mg/dL (ref <10)

## 2017-06-15 LAB — AMMONIA: Ammonia: 44 umol/L — ABNORMAL HIGH (ref 9–35)

## 2017-06-15 LAB — I-STAT CG4 LACTIC ACID, ED
LACTIC ACID, VENOUS: 1.58 mmol/L (ref 0.5–1.9)
Lactic Acid, Venous: 1.34 mmol/L (ref 0.5–1.9)

## 2017-06-15 LAB — LIPASE, BLOOD: Lipase: 27 U/L (ref 11–51)

## 2017-06-15 LAB — APTT: aPTT: 39 seconds — ABNORMAL HIGH (ref 24–36)

## 2017-06-15 LAB — SALICYLATE LEVEL: Salicylate Lvl: <7 mg/dL (ref 2.8–30.0)

## 2017-06-15 MED ORDER — ENOXAPARIN SODIUM 40 MG/0.4ML ~~LOC~~ SOLN
40.0000 mg | SUBCUTANEOUS | Status: DC
  Administered 2017-06-16 – 2017-06-17 (×2): 40 mg via SUBCUTANEOUS
  Filled 2017-06-15: qty 0.4

## 2017-06-15 MED ORDER — LORAZEPAM 2 MG/ML IJ SOLN: 0.5000 mg | INTRAMUSCULAR | Status: DC | PRN

## 2017-06-15 MED ORDER — VITAMIN B-1 100 MG PO TABS
50.0000 mg | ORAL_TABLET | Freq: Once | ORAL | Status: AC
  Administered 2017-06-15: 50 mg via ORAL
  Filled 2017-06-15: qty 1

## 2017-06-15 MED ORDER — SODIUM CHLORIDE 0.9 % IV SOLN
INTRAVENOUS | Status: DC
  Administered 2017-06-15 – 2017-06-17 (×3): via INTRAVENOUS

## 2017-06-15 MED ORDER — ONDANSETRON HCL 4 MG/2ML IJ SOLN: 4.0000 mg | Freq: Four times a day (QID) | INTRAMUSCULAR | Status: DC | PRN

## 2017-06-15 MED ORDER — FOLIC ACID 1 MG PO TABS
1.0000 mg | ORAL_TABLET | Freq: Once | ORAL | Status: AC
  Administered 2017-06-15: 1 mg via ORAL
  Filled 2017-06-15: qty 1

## 2017-06-15 MED ORDER — LORAZEPAM 2 MG/ML IJ SOLN
1.0000 mg | INTRAMUSCULAR | Status: DC | PRN
Start: 2017-06-15 — End: 2017-06-21

## 2017-06-15 MED ORDER — NICOTINE 21 MG/24HR TD PT24
21.0000 mg | MEDICATED_PATCH | Freq: Every day | TRANSDERMAL | Status: DC
  Administered 2017-06-15 – 2017-06-21 (×7): 21 mg via TRANSDERMAL
  Filled 2017-06-15 (×7): qty 1

## 2017-06-15 MED ORDER — IBUPROFEN 200 MG PO TABS
200.0000 mg | ORAL_TABLET | Freq: Four times a day (QID) | ORAL | Status: DC | PRN
  Administered 2017-06-16 – 2017-06-21 (×7): 200 mg via ORAL
  Filled 2017-06-15 (×8): qty 1

## 2017-06-15 MED ORDER — VITAMIN B-1 100 MG PO TABS
100.0000 mg | ORAL_TABLET | Freq: Every day | ORAL | Status: DC
  Administered 2017-06-16 – 2017-06-22 (×7): 100 mg via ORAL
  Filled 2017-06-15 (×7): qty 1

## 2017-06-15 MED ORDER — FOLIC ACID 1 MG PO TABS
1.0000 mg | ORAL_TABLET | Freq: Every day | ORAL | Status: DC
  Administered 2017-06-16 – 2017-06-22 (×7): 1 mg via ORAL
  Filled 2017-06-15 (×7): qty 1

## 2017-06-15 MED ORDER — ZOLPIDEM TARTRATE 5 MG PO TABS
5.0000 mg | ORAL_TABLET | Freq: Every evening | ORAL | Status: DC | PRN
  Administered 2017-06-16 – 2017-06-21 (×6): 5 mg via ORAL
  Filled 2017-06-15 (×6): qty 1

## 2017-06-15 MED ORDER — LACTULOSE 10 GM/15ML PO SOLN
30.0000 g | Freq: Three times a day (TID) | ORAL | Status: DC
  Administered 2017-06-15 – 2017-06-16 (×4): 30 g via ORAL
  Filled 2017-06-15 (×5): qty 45

## 2017-06-15 MED ORDER — ONDANSETRON HCL 4 MG PO TABS: 4.0000 mg | ORAL_TABLET | Freq: Four times a day (QID) | ORAL | Status: DC | PRN

## 2017-06-15 NOTE — H&P (Signed)
History and Physical    Michael Mcfarland IHK:[id number] DOB: 1966/02/02 DOA: 06/15/2017  Referring MD/NP/PA:   PCP: [name], MD   Patient coming from:  The patient is coming from home.  At baseline, pt is independent for most of ADL.      Chief Complaint: generalized weakness, confusion  HPI: Michael Mcfarland is a 52 y.o. male with medical history significant of alcoholic abuse, alcoholic liver cirrhosis, alcohol withdrawal seizure, thrombocytopenia, tobacco abuse, GERD, stroke, depression, migraine headache, panic attacks, who presents with generalized weakness and confusion.  Pt was recently hospitalized from 2/21-2/24 due to seizure-like activity, which was thought to be due to alcohol withdrawal. He had negative EEG and MRI of the brain. Per family, patient's mental status has been declining. He has been confused. He cannot take care of himself. He has generalized weakness, but no unilateral numbness in extremities, no facial droop, slurred speech or vision change or hearing loss. Sometimes he he has loss of control of bowel movement. He has a loose stool bowel movement in this morning. He feels nauseated. Currently patient does not have vomiting, abdominal pain or diarrhea. When I saw pt in ED, he is confused, and is oriented to place and person, not to time. He denies chest pain, shortness of breath. No fever or chills. Denies symptoms of UTI. Per his sister, patient has mild cough. Pt has been living with his sister since discharged from hospital. He did not drink alcohol since 2/24. He did no have new seizure.  ED Course: pt was found to have Ammonia 44, WBC 6.7, negative urinalysis, lactic acid 1.34, UDS positive for benzo, abnormal liver function with ALP 192, AST 60, ALT 26, total bilirubin 2.2, Tylenol less than 10, salicylate less than 7, creatinine 1.09, temperature normal, no tachycardia, oxygen saturation 95% on room air, negative chest x-ray for acute issues. Negative CT head  for acute intracranial abnormalities. Patient is admitted to telemetry bed as inpatient.  Review of Systems: could not be reviewed accurately due to altered mental status.  Travel history: No recent long distant travel.  Allergy: No Known Allergies  Past Medical History:  Diagnosis Date  . Alcohol abuse    /notes 01/18/2017  . Alcohol addiction ([place])   . Alcohol related seizure ([place]) 01/17/2017   Archie Endo 01/17/2017  . Anxiety   . Arthritis   . Depression   . Emphysema lung ([place])   . Frequent headaches   . GERD (gastroesophageal reflux disease)   . Hay fever   . Migraine    frequency "depends on the weather" (01/19/2017)  . Panic attacks   . Pneumonia   . Seizure ([place]) 05/20/2017 X 2  . Stroke [facility])     Past Surgical History:  Procedure Laterality Date  . ANTERIOR CERVICAL DECOMP/DISCECTOMY FUSION    . BACK SURGERY      Social History:  reports that he has been smoking cigarettes.  He has a 8.75 pack-year smoking history. he has never used smokeless tobacco. He reports that he drinks about 8.4 oz of alcohol per week. He reports that he does not use drugs.  Family History:  Family History  Problem Relation Age of Onset  . Diabetes Mother   . Depression Mother   . Hypertension Mother   . Arthritis Mother   . Arthritis Father   . Heart disease Father   . Heart disease Brother   . Diabetes Sister      Prior to Admission medications  Medication Sig Start Date End Date Taking? Authorizing Provider  folic acid (FOLVITE) 1 MG tablet Take 1 tablet (1 mg total) by mouth daily. 06/03/17  Yes [name], MD  hydrOXYzine (ATARAX/VISTARIL) 50 MG tablet Take 1 tablet (50 mg total) by mouth 3 (three) times daily as needed for anxiety (insomina). 06/03/17  Yes [name], MD  thiamine 100 MG tablet Take 1 tablet (100 mg total) by mouth daily. 06/03/17  Yes [name], MD    Physical Exam: Vitals:   06/15/17 1259 06/15/17 1529 06/15/17 1654 06/15/17 1705  BP: 122/84  (!) 116/91  125/83  Pulse: 77 82  71  Resp: 18 16  (!) 23  Temp: 97.8 F (36.6 C)  97.8 F (36.6 C)   TempSrc: Oral  Rectal   SpO2: 97% 97%  95%   General: Not in acute distress HEENT:       Eyes: PERRL, EOMI, no scleral icterus.       ENT: No discharge from the ears and nose, no pharynx injection, no tonsillar enlargement.        Neck: No JVD, no bruit, no mass felt. Heme: No neck lymph node enlargement. Cardiac: S1/S2, RRR, No murmurs, No gallops or rubs. Respiratory: No rales, wheezing, rhonchi or rubs. GI: Soft, nondistended, nontender, no rebound pain, no organomegaly, BS present. GU: No hematuria Ext: No pitting leg edema bilaterally. 2+DP/PT pulse bilaterally. Musculoskeletal: No joint deformities, No joint redness or warmth, no limitation of ROM in spin. Skin: No rashes.  Neuro: confused, oriented to place and person, but not to time, cranial nerves II-XII grossly intact, moves all extremities normally. Muscle strength 5/5 in all extremities, sensation to light touch intact. Brachial reflex 2+ bilaterally. Negative Babinski's sign.  Psych: Patient is not psychotic, no suicidal or hemocidal ideation.  Labs on Admission: I have personally reviewed following labs and imaging studies  CBC: Recent Labs  Lab 06/15/17 1309  WBC 6.7  HGB 15.1  HCT 43.1  MCV 97.5  PLT 272*   Basic Metabolic Panel: Recent Labs  Lab 06/15/17 1446  NA 137  K 4.3  CL 105  CO2 24  GLUCOSE 121*  BUN 7  CREATININE 1.01  CALCIUM 9.3   GFR: Estimated Creatinine Clearance: 102.5 mL/min (by C-G formula based on SCr of 1.01 mg/dL). Liver Function Tests: Recent Labs  Lab 06/15/17 1655  AST 60*  ALT 26  ALKPHOS 192*  BILITOT 2.2*  PROT 7.2  ALBUMIN 3.1*   Recent Labs  Lab 06/15/17 1655  LIPASE 27   Recent Labs  Lab 06/15/17 1655  AMMONIA 44*   Coagulation Profile: No results for input(s): INR, PROTIME in the last 168 hours. Cardiac Enzymes: No results for input(s):  CKTOTAL, CKMB, CKMBINDEX, TROPONINI in the last 168 hours. BNP (last 3 results) No results for input(s): PROBNP in the last 8760 hours. HbA1C: No results for input(s): HGBA1C in the last 72 hours. CBG: No results for input(s): GLUCAP in the last 168 hours. Lipid Profile: No results for input(s): CHOL, HDL, LDLCALC, TRIG, CHOLHDL, LDLDIRECT in the last 72 hours. Thyroid Function Tests: No results for input(s): TSH, T4TOTAL, FREET4, T3FREE, THYROIDAB in the last 72 hours. Anemia Panel: No results for input(s): VITAMINB12, FOLATE, FERRITIN, TIBC, IRON, RETICCTPCT in the last 72 hours. Urine analysis:    Component Value Date/Time   COLORURINE YELLOW 06/15/2017 [place] 06/15/2017 1705   LABSPEC 1.006 06/15/2017 1705   PHURINE 5.0 06/15/2017 1705  GLUCOSEU NEGATIVE 06/15/2017 [place] 06/15/2017 1705   [place] 06/15/2017 [place] 06/15/2017 1705   PROTEINUR NEGATIVE 06/15/2017 1705   NITRITE NEGATIVE 06/15/2017 1705   LEUKOCYTESUR NEGATIVE 06/15/2017 1705   Sepsis Labs: @LABRCNTIP (procalcitonin:4,lacticidven:4) )No results found for this or any previous visit (from the past 240 hour(s)).   Radiological Exams on Admission: Dg Chest 2 View  Result Date: 06/15/2017 CLINICAL DATA:  Recent hospitalization for seizure activity. The patient has been coming progressively weaker and is unable to function independently. The patient has lost control of his bowels. EXAM: CHEST - 2 VIEW COMPARISON:  PA and lateral chest x-ray of May 20, 2017 FINDINGS: The lungs are well-expanded. The interstitial markings are coarse though stable. The heart and pulmonary vascularity are normal. There is calcification in the wall of the aortic arch. There is no pleural effusion. There is chronic partial compression of the body of T12. There is mild multilevel degenerative disc disease of the thoracic spine. IMPRESSION: There is no acute pneumonia nor  pulmonary edema. There are stable chronic bronchitic-smoking related changes. Electronically Signed   By: David  Martinique M.D.   On: 06/15/2017 16:38   Ct Head Wo Contrast  Result Date: 06/15/2017 CLINICAL DATA:  Altered level of consciousness, seizures, history of ethanol abuse, emphysema, GERD, stroke, smoker EXAM: CT HEAD WITHOUT CONTRAST TECHNIQUE: Contiguous axial images were obtained from the base of the skull through the vertex without intravenous contrast. COMPARISON:  05/20/2017 FINDINGS: Brain: Normal ventricular morphology. No midline shift or mass effect. Normal appearance of brain parenchyma. No intracranial hemorrhage, mass lesion, evidence of acute infarction, or extra-axial fluid collection. Vascular: No hyperdense vessels Skull: Intact Sinuses/Orbits: Clear Other: N/A IMPRESSION: Normal exam. Electronically Signed   By: Lavonia Dana M.D.   On: 06/15/2017 18:47     EKG: Independently reviewed.  Sinus rhythm, QTC 467, RAD, low voltage, anteroseptal infarction pattern, nonspecific T-wave change, poor R-wave progression  Assessment/Plan Principal Problem:   Acute metabolic encephalopathy Active Problems:   Cirrhosis (HCC)   Thrombocytopenia (HCC)   Generalized weakness   Acute hepatic encephalopathy   Tobacco abuse   Hepatic encephalopathy (HCC)   Acute metabolic encephalopath: possibly due to hepatic encephalopathy given history of alcoholic liver cirrhosis and elevated ammonia level. CT-head is negative for acute intracranial abnormalities. No focal neurological findings on physical examination. No signs of infection. Patient had TSH 3.16 and vitamin B12 level 1330 on 06/03/17. Will treat patient presumably as hepatic encephalopathy with lactulose. If no improvement, will consider to get MRI of brain tomorrow. Patient has thrombocytopenia, TTP is a potential differential diagnosis, but his thrombocytopenia is chronic issue, it is likely due to liver cirrhosis, therefore less likely to  have TTP.  -will admit to tele bed as inpt -restart lactulose 30 g tid -Frequent neuro check -f/u UA, RPR, Folate level -will get PT/OT for generalized weakness. -f/u peripheral smear  Tobacco abuse and Alcohol abuse: pt did not drink alcohol since 2/24.  -Nicotine patch  Alcoholic liver Cirrhosis and abnormal liver function: ALP 192, AST 60, ALT 26, total bilirubin 2.2. -Using Tylenol -Check INT/PTT -check hepatitis panel and HIV antibody  Thrombocytopenia ([place]): platelet 109, likely due to cirrhosis. No active bleeding tendency. -Follow-up CBC - peripheral smear    DVT ppx: SQ Lovenox Code Status: Full code Family Communication: Yes, patient's son and sister, daughter-in law at bed side Disposition Plan:  Anticipate discharge back to previous home environment Consults called:  none Admission  status:   Inpatient/tele   Date of Service 06/15/2017    Ivor Costa [facility] Pager [phone]  If 7PM-7AM, please contact night-coverage [email or web address] Password [facility] 06/15/2017, 9:26 PM

## 2017-06-15 NOTE — Telephone Encounter (Signed)
Patient sister [name] is calling to check on the status of getting a returned call. Please advise.   [phone]

## 2017-06-15 NOTE — ED Notes (Signed)
Pt in XR. 

## 2017-06-15 NOTE — Telephone Encounter (Signed)
See note

## 2017-06-15 NOTE — Telephone Encounter (Signed)
Spoke with patient's sister.  Patient has been "going downhill" rapidly, per sister.  Sister would like to know if Dr. [name] would order an ammonia level on patient.  Sister states patient wasn't able to cut up his own roast beef at dinner.  Unable to put on his own shoes.  Asking for resources regarding assisted living or other placement.  States assisted living is difficult because patient is only 52 years old.  I advised that I will look into some resources for her.   Per Dr. [name], ok to order ammonia level for patient.

## 2017-06-15 NOTE — ED Notes (Signed)
Lab called BMP was hemolyzed and needs to be recollected and reordered.

## 2017-06-15 NOTE — ED Triage Notes (Addendum)
Pt was recently admitted to the hospital following seizures. Pt was discharged and progressively getting weaker. Family states that at baseline pt was independent, now is so weak that he can not function, walk, perform ADL or control his bowels. Hx of alcohol abuse but pt denies any drinking since being discharged.

## 2017-06-15 NOTE — ED Provider Notes (Signed)
[facility] EMERGENCY DEPARTMENT Provider Note   CSN: [id number] Arrival date & time: 06/15/17  1252     History   Chief Complaint Chief Complaint  Patient presents with  . Weakness    HPI Michael Mcfarland is a 52 y.o. male with history of alcohol abuse, tobacco abuse, anxiety, depression is brought in by family for evaluation of worsening generalized weakness and inability to care for himself since last hospital discharge 3 weeks ago. Patient was admitted for seizure-like activity thought to be due to alcohol withdrawal, he had normal EEG and MRI brain and was discharged home. Since discharge, family has noticed patient has been rapidly worsening. He can't feed himself, can't unwrap a piece of gum, light a cigarette, zip his pants or put his shoes one. Overall he has less energy, slower speech and intermittently confused.Has had watery diarrhea for several weeks without bright blood or melena, however now patient is having more frequent loss of bowel control. Sister states that a couple of days ago patient pulled off one of his teeth. Family at bedside have been planning on selling patient's house and placing him in an apartment however they do not think he is able to care for himself if he lives alone. They have attempted to contact his PCP to help with assisted living or home health aide. Family reports wet sounding cough, generalized weakness and low energy.Last alcohol intake was 3 weeks ago. Family and patient deny illicit drug use, head trauma or falls. No witnessed seizure like activity. No fevers however patient has been complaining of being cold more frequently. Patient is alert and oriented x 3, states he is here for feeling weak.    HPI  Past Medical History:  Diagnosis Date  . Alcohol abuse    /notes 01/18/2017  . Alcohol addiction ([place])   . Alcohol related seizure ([place]) 01/17/2017   Archie Endo 01/17/2017  . Anxiety   . Arthritis   . Depression   . Emphysema  lung ([place])   . Frequent headaches   . GERD (gastroesophageal reflux disease)   . Hay fever   . Migraine    frequency "depends on the weather" (01/19/2017)  . Panic attacks   . Pneumonia   . Seizure ([place]) 05/20/2017 X 2  . Stroke [facility])     Patient Active Problem List   Diagnosis Date Noted  . Weakness 06/03/2017  . Gait abnormality 06/03/2017  . Anxiety 06/03/2017  . Diarrhea 06/03/2017  . Pressure injury of skin 05/22/2017  . AKI (acute kidney injury) ([place])   . Seizure ([place])   . Cirrhosis ([place])   . Syncope   . Alcohol abuse   . Thrombocytopenia ([place])   . Alcohol withdrawal seizure ([place]) 01/17/2017    Past Surgical History:  Procedure Laterality Date  . ANTERIOR CERVICAL DECOMP/DISCECTOMY FUSION    . BACK SURGERY         Home Medications    Prior to Admission medications   Medication Sig Start Date End Date Taking? Authorizing Provider  folic acid (FOLVITE) 1 MG tablet Take 1 tablet (1 mg total) by mouth daily. 06/03/17  Yes [name], MD  hydrOXYzine (ATARAX/VISTARIL) 50 MG tablet Take 1 tablet (50 mg total) by mouth 3 (three) times daily as needed for anxiety (insomina). 06/03/17  Yes [name], MD  thiamine 100 MG tablet Take 1 tablet (100 mg total) by mouth daily. 06/03/17  Yes [name], MD    Family History  Family History  Problem Relation Age of Onset  . Diabetes Mother   . Depression Mother   . Hypertension Mother   . Arthritis Mother   . Arthritis Father   . Heart disease Father   . Heart disease Brother   . Diabetes Sister     Social History Social History   Tobacco Use  . Smoking status: Current Every Day Smoker    Packs/day: 0.25    Years: 35.00    Pack years: 8.75    Types: Cigarettes    Last attempt to quit: 12/28/2016    Years since quitting: 0.4  . Smokeless tobacco: Never Used  Substance Use Topics  . Alcohol use: Yes    Alcohol/week: 8.4 oz    Types: 14 Cans of beer per week    Comment: 05/20/2017 "couple beers to  help me sleep q night"  . Drug use: No    Comment: 05/20/2017 "been a long time; I tried different things when I was younger"     Allergies   Patient has no known allergies.   Review of Systems Review of Systems  Constitutional: Positive for chills.  Respiratory: Positive for cough.   Gastrointestinal: Positive for diarrhea.  Neurological: Positive for weakness (generalized).  Psychiatric/Behavioral: Positive for confusion.  All other systems reviewed and are negative.    Physical Exam Updated Vital Signs BP 125/83   Pulse 71   Temp 97.8 F (36.6 C) (Rectal)   Resp (!) 23   SpO2 95%   Physical Exam  Constitutional: He is oriented to person, place, and time. He appears well-developed and well-nourished. No distress.  Non toxic. Looks tired, eyes keep closing during exam. Appears older than stated age.   HENT:  Head: Normocephalic and atraumatic.  Nose: Nose normal.  Mouth/Throat: No oropharyngeal exudate.  Moist mucous membranes   Eyes: Conjunctivae and EOM are normal. Pupils are equal, round, and reactive to light.  No scleral icterus  Neck: Normal range of motion.  Cardiovascular: Normal rate, regular rhythm, S1 normal, S2 normal, normal heart sounds and intact distal pulses.  No murmur heard. 2+ DP and radial pulses bilaterally. Trace, symmetric LE edema. No calf tenderness.   Pulmonary/Chest: Effort normal. He has decreased breath sounds in the right lower field and the left lower field.  No wheezing, crackles. Normal work of breathing.   Abdominal: Soft. Bowel sounds are normal. There is no tenderness.  No G/R/R. No suprapubic or CVA tenderness.   Musculoskeletal: Normal range of motion. He exhibits no deformity.  Neurological: He is alert and oriented to person, place, and time.  Alert and oriented to self, place, time and event.  Speech is fluent without obvious dysarthria or dysphasia. Strength 5/5 with hand grip and ankle F/E.   Sensation to light touch  intact in hands and feet. No truncal sway. No pronator drift.  Slow finger-to-nose and finger tapping.  CN I and VIII not tested. CN II-XII grossly intact bilaterally.  Knee DTR symmetric. No ankle clonus. No asterixis.   Skin: Skin is warm and dry. Capillary refill takes less than 2 seconds.  Psychiatric: He has a normal mood and affect. His behavior is normal. Judgment and thought content normal.  Nursing note and vitals reviewed.    ED Treatments / Results  Labs (all labs ordered are listed, but only abnormal results are displayed) Labs Reviewed  CBC - Abnormal; Notable for the following components:      Result Value   MCH 34.2 (*)  Platelets 109 (*)    All other components within normal limits  BASIC METABOLIC PANEL - Abnormal; Notable for the following components:   Glucose, Bld 121 (*)    All other components within normal limits  AMMONIA - Abnormal; Notable for the following components:   Ammonia 44 (*)    All other components within normal limits  HEPATIC FUNCTION PANEL - Abnormal; Notable for the following components:   Albumin 3.1 (*)    AST 60 (*)    Alkaline Phosphatase 192 (*)    Total Bilirubin 2.2 (*)    Bilirubin, Direct 0.6 (*)    Indirect Bilirubin 1.6 (*)    All other components within normal limits  ACETAMINOPHEN LEVEL - Abnormal; Notable for the following components:   Acetaminophen (Tylenol), Serum <10 (*)    All other components within normal limits  RAPID URINE DRUG SCREEN, HOSP PERFORMED - Abnormal; Notable for the following components:   Benzodiazepines POSITIVE (*)    All other components within normal limits  URINALYSIS, ROUTINE W REFLEX MICROSCOPIC  LIPASE, BLOOD  SALICYLATE LEVEL  ETHANOL  I-STAT CG4 LACTIC ACID, ED  I-STAT CG4 LACTIC ACID, ED    EKG  EKG Interpretation  Date/Time:  Tuesday June 15 2017 13:33:04 EDT Ventricular Rate:  79 PR Interval:  166 QRS Duration: 80 QT Interval:  408 QTC Calculation: 467 R  Axis:   139 Text Interpretation:  Normal sinus rhythm Biatrial enlargement Right axis deviation Pulmonary disease pattern Septal infarct , age undetermined Abnormal ECG Abnormal ekg Confirmed by Carmin Muskrat [phone]) on 06/15/2017 5:49:17 PM       Radiology Dg Chest 2 View  Result Date: 06/15/2017 CLINICAL DATA:  Recent hospitalization for seizure activity. The patient has been coming progressively weaker and is unable to function independently. The patient has lost control of his bowels. EXAM: CHEST - 2 VIEW COMPARISON:  PA and lateral chest x-ray of May 20, 2017 FINDINGS: The lungs are well-expanded. The interstitial markings are coarse though stable. The heart and pulmonary vascularity are normal. There is calcification in the wall of the aortic arch. There is no pleural effusion. There is chronic partial compression of the body of T12. There is mild multilevel degenerative disc disease of the thoracic spine. IMPRESSION: There is no acute pneumonia nor pulmonary edema. There are stable chronic bronchitic-smoking related changes. Electronically Signed   By: David  Martinique M.D.   On: 06/15/2017 16:38   Ct Head Wo Contrast  Result Date: 06/15/2017 CLINICAL DATA:  Altered level of consciousness, seizures, history of ethanol abuse, emphysema, GERD, stroke, smoker EXAM: CT HEAD WITHOUT CONTRAST TECHNIQUE: Contiguous axial images were obtained from the base of the skull through the vertex without intravenous contrast. COMPARISON:  05/20/2017 FINDINGS: Brain: Normal ventricular morphology. No midline shift or mass effect. Normal appearance of brain parenchyma. No intracranial hemorrhage, mass lesion, evidence of acute infarction, or extra-axial fluid collection. Vascular: No hyperdense vessels Skull: Intact Sinuses/Orbits: Clear Other: N/A IMPRESSION: Normal exam. Electronically Signed   By: Lavonia Dana M.D.   On: 06/15/2017 18:47    Procedures Procedures (including critical care  time)  Medications Ordered in ED Medications  folic acid (FOLVITE) tablet 1 mg (1 mg Oral Given 06/15/17 1859)  thiamine (VITAMIN B-1) tablet 50 mg (50 mg Oral Given 06/15/17 1859)     Initial Impression / Assessment and Plan / ED Course  I have reviewed the triage vital signs and the nursing notes.  Pertinent labs & imaging results that were  available during my care of the patient were reviewed by me and considered in my medical decision making (see chart for details).  Clinical Course as of Jun 16 1954  Tue Jun 15, 2017  1547 Friday tooth ripped out with root  Cough chills Diarrhea chronic no blood  No other infection symptoms  [CG]  1745 Ammonia: (!) 44 [CG]  1745 Benzodiazepines: (!) POSITIVE [CG]  1747 Alkaline Phosphatase: (!) 192 [CG]  1747 Total Bilirubin: (!) 2.2 [CG]    Clinical Course User Index [CG] Kinnie Feil, PA-C   Differential diagnosis includes alcoholic encephalopathy, acute intoxication, occult infection leading to altered mental status.   Lab work and imaging reviewed ammonia 44, +benzodiazepines in urine, elevated alk phos/total bili improved from previous. No sources of infection found on chest x-ray or urinalysis. CT head negative. Labs from PCP on 3/7 including Hep C antibody, TSh, RPR, B12 negative. Will request admission for acute encephalopathy. Pt shared with Dr. Vanita Panda.   Final Clinical Impressions(s) / ED Diagnoses   Final diagnoses:  None    ED Discharge Orders    None       Arlean Hopping 06/15/17 2025    Carmin Muskrat, MD 06/17/17 1659

## 2017-06-15 NOTE — ED Notes (Signed)
Attempted report x1. 

## 2017-06-16 ENCOUNTER — Inpatient Hospital Stay (HOSPITAL_COMMUNITY): Payer: Self-pay

## 2017-06-16 DIAGNOSIS — K7290 Hepatic failure, unspecified without coma: Secondary | ICD-10-CM

## 2017-06-16 LAB — CBC
HCT: 40.6 % (ref 39.0–52.0)
Hemoglobin: 14.1 g/dL (ref 13.0–17.0)
MCH: 33.7 pg (ref 26.0–34.0)
MCHC: 34.7 g/dL (ref 30.0–36.0)
MCV: 96.9 fL (ref 78.0–100.0)
PLATELETS: 108 10*3/uL — AB (ref 150–400)
RBC: 4.19 MIL/uL — AB (ref 4.22–5.81)
RDW: 15.5 % (ref 11.5–15.5)
WBC: 6.9 10*3/uL (ref 4.0–10.5)

## 2017-06-16 LAB — COMPREHENSIVE METABOLIC PANEL
ALBUMIN: 3.1 g/dL — AB (ref 3.5–5.0)
ALK PHOS: 179 U/L — AB (ref 38–126)
ALT: 25 U/L (ref 17–63)
ANION GAP: 10 (ref 5–15)
AST: 53 U/L — ABNORMAL HIGH (ref 15–41)
BILIRUBIN TOTAL: 2.9 mg/dL — AB (ref 0.3–1.2)
BUN: 9 mg/dL (ref 6–20)
CALCIUM: 9.2 mg/dL (ref 8.9–10.3)
CO2: 22 mmol/L (ref 22–32)
CREATININE: 1.07 mg/dL (ref 0.61–1.24)
Chloride: 106 mmol/L (ref 101–111)
GFR calc non Af Amer: >60 mL/min (ref >60)
Glucose, Bld: 82 mg/dL (ref 65–99)
Potassium: 4.1 mmol/L (ref 3.5–5.1)
Sodium: 138 mmol/L (ref 135–145)
TOTAL PROTEIN: 7.2 g/dL (ref 6.5–8.1)

## 2017-06-16 LAB — FOLATE: FOLATE: 9.2 ng/mL (ref >5.9)

## 2017-06-16 LAB — GLUCOSE, CAPILLARY: GLUCOSE-CAPILLARY: 110 mg/dL — AB (ref 65–99)

## 2017-06-16 LAB — RPR: RPR: NONREACTIVE

## 2017-06-16 LAB — SAVE SMEAR

## 2017-06-16 LAB — HIV ANTIBODY (ROUTINE TESTING W REFLEX): HIV Screen 4th Generation wRfx: NONREACTIVE

## 2017-06-16 MED ORDER — DEXTROSE 5 % IV SOLN
12.5000 mg | Freq: Once | INTRAVENOUS | Status: DC
  Filled 2017-06-16: qty 2.5

## 2017-06-16 MED ORDER — DIPHENHYDRAMINE HCL 50 MG/ML IJ SOLN
5.0000 mg | Freq: Once | INTRAMUSCULAR | Status: AC
  Administered 2017-06-16: 5 mg via INTRAVENOUS
  Filled 2017-06-16: qty 1

## 2017-06-16 MED ORDER — METOCLOPRAMIDE HCL 5 MG/ML IJ SOLN
12.5000 mg | Freq: Once | INTRAMUSCULAR | Status: AC
  Administered 2017-06-16: 12.5 mg via INTRAVENOUS
  Filled 2017-06-16: qty 2.5

## 2017-06-16 MED ORDER — GADOBENATE DIMEGLUMINE 529 MG/ML IV SOLN
20.0000 mL | Freq: Once | INTRAVENOUS | Status: AC
  Administered 2017-06-16: 20 mL via INTRAVENOUS

## 2017-06-16 MED ORDER — KETOROLAC TROMETHAMINE 15 MG/ML IJ SOLN
7.5000 mg | Freq: Once | INTRAMUSCULAR | Status: AC
  Administered 2017-06-16: 7.5 mg via INTRAVENOUS
  Filled 2017-06-16: qty 1

## 2017-06-16 NOTE — Progress Notes (Signed)
Patient admitted from ED with weakness and confusion. Alert , family by the bedside. Oriented to room and surroundings. Telemetry applied and skin assessment done times two nurses.

## 2017-06-16 NOTE — Progress Notes (Signed)
PROGRESS NOTE  Michael Mcfarland TWS:[id number] DOB: [birth year] DOA: 06/15/2017 PCP: [name], MD  HPI/Recap of past 24 hours: Michael Mcfarland is a 52 y.o. male with medical history significant of alcoholic abuse, alcoholic liver cirrhosis, alcohol withdrawal seizure, thrombocytopenia, tobacco abuse, GERD, stroke, depression, migraine headache, panic attacks, who presents with generalized weakness and confusion.  06/16/17: Patient seen and examined at his bedside.  He is alert and oriented x2.  Had 2 bowel movements this morning on lactulose.  Ammonia level on presentation.  Will repeat an ammonia level in the morning. Report  moderate headache with sensitivity to light and noise.  Migraine cocktail given.   Assessment/Plan: Principal Problem:   Acute metabolic encephalopathy Active Problems:   Cirrhosis (HCC)   Thrombocytopenia (HCC)   Generalized weakness   Acute hepatic encephalopathy   Tobacco abuse   Hepatic encephalopathy (HCC)   Acute metabolic encephalopathy most likely secondary to Hyperammonemia Continue lactulose Ammonia level on admission 44 We will obtain an MRI brain to further assess Continue close monitoring  History of seizure secondary to alcohol withdrawal No alcohol intake for more than a month Normal seizure medication Seizure precaution  Alcoholic liver cirrhosis Transaminitis Acute hepatitis panel negative HIV screen nonreactive Continue to monitor  Chronic thrombocytopenia No sign of overt bleeding Continue to to monitor   Code Status: full  Family Communication: Daughter in Sports coach at bedside.  Disposition Plan: Home when hemodynamically stable.   Consultants:  None.  Procedures: None. Antimicrobials:  None.  DVT prophylaxis: SCDs.  Subcu Lovenox.   Objective: Vitals:   06/16/17 0345 06/16/17 0800 06/16/17 1200 06/16/17 1421  BP: 126/80 130/79  96/72  Pulse:  94  84  Resp:  17 16 17   Temp: 98.8 F (37.1 C) 99.1 F  (37.3 C)  98.4 F (36.9 C)  TempSrc: Oral Oral  Oral  SpO2:  96%  95%  Weight:      Height:        Intake/Output Summary (Last 24 hours) at 06/16/2017 1635 Last data filed at 06/16/2017 1600 Gross per 24 hour  Intake 2633.33 ml  Output -  Net 2633.33 ml   Filed Weights   06/15/17 2258  Weight: 97.6 kg (215 lb 1.6 oz)    Exam:   General: 52 year old Caucasian male well-developed well-nourished.  In no apparent acute distress.  Very slow at processing information.  Alert and oriented x2.  Cardiovascular:Regular rate and rhythm with no murmurs rubs or gallops  Respiratory: Clear to auscultation with no wheezes or rales  Abdomen: Soft nontender nondistended with normal bowel sounds x4  Musculoskeletal: Bilateral lower extremity weakness  Skin: No rash noted  Psychiatry: Mood is appropriate for condition and setting.   Data Reviewed: CBC: Recent Labs  Lab 06/15/17 1309 06/16/17 0037  WBC 6.7 6.9  HGB 15.1 14.1  HCT 43.1 40.6  MCV 97.5 96.9  PLT 109* 001*   Basic Metabolic Panel: Recent Labs  Lab 06/15/17 1446 06/16/17 0037  NA 137 138  K 4.3 4.1  CL 105 106  CO2 24 22  GLUCOSE 121* 82  BUN 7 9  CREATININE 1.01 1.07  CALCIUM 9.3 9.2   GFR: Estimated Creatinine Clearance: 96.7 mL/min (by C-G formula based on SCr of 1.07 mg/dL). Liver Function Tests: Recent Labs  Lab 06/15/17 1655 06/16/17 0037  AST 60* 53*  ALT 26 25  ALKPHOS 192* 179*  BILITOT 2.2* 2.9*  PROT 7.2 7.2  ALBUMIN 3.1* 3.1*   Recent  Labs  Lab 06/15/17 1655  LIPASE 27   Recent Labs  Lab 06/15/17 1655  AMMONIA 44*   Coagulation Profile: Recent Labs  Lab 06/15/17 2323  INR 1.44   Cardiac Enzymes: No results for input(s): CKTOTAL, CKMB, CKMBINDEX, TROPONINI in the last 168 hours. BNP (last 3 results) No results for input(s): PROBNP in the last 8760 hours. HbA1C: No results for input(s): HGBA1C in the last 72 hours. CBG: Recent Labs  Lab 06/16/17 0520  GLUCAP 110*    Lipid Profile: No results for input(s): CHOL, HDL, LDLCALC, TRIG, CHOLHDL, LDLDIRECT in the last 72 hours. Thyroid Function Tests: No results for input(s): TSH, T4TOTAL, FREET4, T3FREE, THYROIDAB in the last 72 hours. Anemia Panel: Recent Labs    06/15/17 2323  FOLATE 9.2   Urine analysis:    Component Value Date/Time   COLORURINE YELLOW 06/15/2017 [place] 06/15/2017 1705   LABSPEC 1.006 06/15/2017 1705   PHURINE 5.0 06/15/2017 1705   GLUCOSEU NEGATIVE 06/15/2017 1705   HGBUR NEGATIVE 06/15/2017 1705   BILIRUBINUR NEGATIVE 06/15/2017 1705   KETONESUR NEGATIVE 06/15/2017 1705   PROTEINUR NEGATIVE 06/15/2017 1705   NITRITE NEGATIVE 06/15/2017 1705   LEUKOCYTESUR NEGATIVE 06/15/2017 1705   Sepsis Labs: @LABRCNTIP (procalcitonin:4,lacticidven:4)  )No results found for this or any previous visit (from the past 240 hour(s)).    Studies: Dg Chest 2 View  Result Date: 06/15/2017 CLINICAL DATA:  Recent hospitalization for seizure activity. The patient has been coming progressively weaker and is unable to function independently. The patient has lost control of his bowels. EXAM: CHEST - 2 VIEW COMPARISON:  PA and lateral chest x-ray of May 20, 2017 FINDINGS: The lungs are well-expanded. The interstitial markings are coarse though stable. The heart and pulmonary vascularity are normal. There is calcification in the wall of the aortic arch. There is no pleural effusion. There is chronic partial compression of the body of T12. There is mild multilevel degenerative disc disease of the thoracic spine. IMPRESSION: There is no acute pneumonia nor pulmonary edema. There are stable chronic bronchitic-smoking related changes. Electronically Signed   By: David  Martinique M.D.   On: 06/15/2017 16:38   Ct Head Wo Contrast  Result Date: 06/15/2017 CLINICAL DATA:  Altered level of consciousness, seizures, history of ethanol abuse, emphysema, GERD, stroke, smoker EXAM: CT HEAD  WITHOUT CONTRAST TECHNIQUE: Contiguous axial images were obtained from the base of the skull through the vertex without intravenous contrast. COMPARISON:  05/20/2017 FINDINGS: Brain: Normal ventricular morphology. No midline shift or mass effect. Normal appearance of brain parenchyma. No intracranial hemorrhage, mass lesion, evidence of acute infarction, or extra-axial fluid collection. Vascular: No hyperdense vessels Skull: Intact Sinuses/Orbits: Clear Other: N/A IMPRESSION: Normal exam. Electronically Signed   By: Lavonia Dana M.D.   On: 06/15/2017 18:47    Scheduled Meds: . enoxaparin (LOVENOX) injection  40 mg Subcutaneous Q24H  . folic acid  1 mg Oral Daily  . lactulose  30 g Oral TID  . nicotine  21 mg Transdermal Daily  . thiamine  100 mg Oral Daily    Continuous Infusions: . sodium chloride 100 mL/hr at 06/15/17 2316     LOS: 1 day     Kayleen Memos, MD [facility] Pager [phone]  If 7PM-7AM, please contact night-coverage [email or web address] Password [facility] 06/16/2017, 4:35 PM

## 2017-06-16 NOTE — Telephone Encounter (Signed)
Patient has been admitted to the hospital.  Will touch base with family.

## 2017-06-16 NOTE — Evaluation (Signed)
Physical Therapy Evaluation Patient Details Name: Michael Mcfarland MRN: [id number] DOB: [birth year] Today's Date: 06/16/2017   History of Present Illness  Pt is a 52 y.o. male with PMH significant of alcoholic abuse, alcoholic liver cirrhosis, alcohol withdrawal seizure, thrombocytopenia, tobacco abuse, GERD, stroke, depression, migraine headache, panic attacks, who presents 06/15/17 with generalized weakness and confusion. Worked up for acute metabolic encephalopathy, possibly due to hepatic encephalopathy given h/o alcoholic liver cirrhosis and elevated ammonia level. CT-head negative for acute intracranial abnormalities.    Clinical Impression  Pt presents with an overall decrease in functional mobility secondary to above. PTA, pt indep with ambulation, but has had worsening balance and reports at least 2x falls in the past 2 months; lives with sister who is unable to provide physical assist. Today, pt able to ambulate short distance with intermittent HHA and minA to correct LOB; pt demonstrates decreased safety awareness and insight into deficits. At significant increased risk for falls. Pt would benefit from continued acute PT services to maximize functional mobility and independence prior to d/c with SNF-level therapies.     Follow Up Recommendations SNF;Supervision/Assistance - 24 hour    Equipment Recommendations  (TBD next venue)    Recommendations for Other Services       Precautions / Restrictions Precautions Precautions: Fall Restrictions Weight Bearing Restrictions: No      Mobility  Bed Mobility Overal bed mobility: Needs Assistance Bed Mobility: Supine to Sit     Supine to sit: Supervision;HOB elevated Sit to supine: Supervision   General bed mobility comments: Reliant on use of bed rail; increased time and effort  Transfers Overall transfer level: Needs assistance Equipment used: None;1 person hand held assist Transfers: Sit to/from Stand Sit to Stand: Min  assist         General transfer comment: Initial HHA and minA for steadying balance upon standing  Ambulation/Gait Ambulation/Gait assistance: Min assist;Min guard Ambulation Distance (Feet): 90 Feet Assistive device: 1 person hand held assist;None Gait Pattern/deviations: Step-through pattern;Decreased stride length;Drifts right/left Gait velocity: Decreased Gait velocity interpretation: <1.8 ft/sec, indicative of risk for recurrent falls General Gait Details: Unsteady ambulation with min guard for balance, requiring minA to correct 1x posterior LOB; pt occasionally reaching to hand rail for UE support. Demonstrates decreased safety awareness and postural reactions  Stairs            Wheelchair Mobility    Modified Rankin (Stroke Patients Only)       Balance Overall balance assessment: Needs assistance Sitting-balance support: Feet supported;No upper extremity supported Sitting balance-Leahy Scale: Good     Standing balance support: No upper extremity supported;During functional activity Standing balance-Leahy Scale: Poor Standing balance comment: Intermittent minA for balance; unable to accept challenge                             Pertinent Vitals/Pain Pain Assessment: 0-10 Pain Score: 6  Faces Pain Scale: Hurts little more Pain Location: head Pain Descriptors / Indicators: Headache Pain Intervention(s): Limited activity within patient's tolerance;Monitored during session    Home Living Family/patient expects to be discharged to:: Private residence Living Arrangements: Other relatives(sister) Available Help at Discharge: Family;Available 24 hours/day Type of Home: Apartment Home Access: Level entry     Home Layout: One level Home Equipment: Shower seat Additional Comments: Per pts daughter in law; pt lives with his sister who has health conditions and has difficulty caring for him in his current state  Prior Function Level of Independence:  Needs assistance   Gait / Transfers Assistance Needed: No use of AD but balance issues with gait  ADL's / Homemaking Assistance Needed: Difficulty with LB ADL PTA        Hand Dominance   Dominant Hand: Right    Extremity/Trunk Assessment   Upper Extremity Assessment Upper Extremity Assessment: Generalized weakness    Lower Extremity Assessment Lower Extremity Assessment: Generalized weakness    Cervical / Trunk Assessment Cervical / Trunk Assessment: Normal  Communication   Communication: No difficulties  Cognition Arousal/Alertness: Awake/alert Behavior During Therapy: Flat affect Overall Cognitive Status: Impaired/Different from baseline Area of Impairment: Safety/judgement;Following commands;Problem solving                       Following Commands: Follows one step commands consistently;Follows one step commands with increased time Safety/Judgement: Decreased awareness of safety;Decreased awareness of deficits   Problem Solving: Slow processing;Requires verbal cues General Comments: Pts daughter in law reports cognition seems more impaired than baseline (slower to respond/react)      General Comments General comments (skin integrity, edema, etc.): Friend/significant other present throughout session    Exercises     Assessment/Plan    PT Assessment Patient needs continued PT services  PT Problem List Decreased strength;Decreased activity tolerance;Decreased balance;Decreased mobility;Decreased cognition;Decreased knowledge of use of DME;Decreased safety awareness       PT Treatment Interventions DME instruction;Gait training;Stair training;Therapeutic activities;Functional mobility training;Therapeutic exercise;Balance training;Patient/family education    PT Goals (Current goals can be found in the Care Plan section)  Acute Rehab PT Goals Patient Stated Goal: none stated PT Goal Formulation: With patient Time For Goal Achievement: 06/30/17     Frequency Min 2X/week   Barriers to discharge Decreased caregiver support      Co-evaluation               AM-PAC PT "6 Clicks" Daily Activity  Outcome Measure Difficulty turning over in bed (including adjusting bedclothes, sheets and blankets)?: A Little Difficulty moving from lying on back to sitting on the side of the bed? : A Little Difficulty sitting down on and standing up from a chair with arms (e.g., wheelchair, bedside commode, etc,.)?: Unable Help needed moving to and from a bed to chair (including a wheelchair)?: A Little Help needed walking in hospital room?: A Little Help needed climbing 3-5 steps with a railing? : A Lot 6 Click Score: 15    End of Session Equipment Utilized During Treatment: Gait belt Activity Tolerance: Patient tolerated treatment well Patient left: in bed;with call bell/phone within reach;with family/visitor present Nurse Communication: Mobility status PT Visit Diagnosis: Other abnormalities of gait and mobility (R26.89);Repeated falls (R29.6)    Time: 0950-1006 PT Time Calculation (min) (ACUTE ONLY): 16 min   Charges:   PT Evaluation $PT Eval Moderate Complexity: 1 Mod     PT G Codes:       Mabeline Caras, PT, DPT Acute Rehab Services  Pager: [place] 06/16/2017, 10:51 AM

## 2017-06-16 NOTE — Progress Notes (Signed)
Patient c/o headache 6/10, ibuprofen PRN given, no relief, Hall MD paged, verbal order received for one time dose of toradol 7.5mg   IV , one time dose reglan 12.5mg  IV, and onetime dose of Benadryl 5mg  IV, will continue to monitor.

## 2017-06-16 NOTE — Evaluation (Signed)
Occupational Therapy Evaluation Patient Details Name: Michael Mcfarland MRN: [id number] DOB: [birth year] Today's Date: 06/16/2017    History of Present Illness 52 y.o. male with medical history significant of alcoholic abuse, alcoholic liver cirrhosis, alcohol withdrawal seizure, thrombocytopenia, tobacco abuse, GERD, stroke, depression, migraine headache, panic attacks, who presents with generalized weakness and confusion.   Clinical Impression   Per pt and pts daughter in law; pt with difficulty caring for himself at home recently. Pt has been staying with his sister but she has difficulty caring for him as well. Currently pt requires min assist for functional mobility due to poor balance and requires mod assist for LB ADL. Pt presenting with impaired cognition, generalized weakness, decreased activity tolerance, and poor safety awareness impacting his independence and safety with ADL and functional mobility. Recommending SNF for continued rehab to maximize independence and safety with ADL and functional mobility prior to return home. Pt would benefit from continued skilled OT to address established goals.    Follow Up Recommendations  SNF;Supervision/Assistance - 24 hour    Equipment Recommendations  None recommended by OT    Recommendations for Other Services PT consult     Precautions / Restrictions Precautions Precautions: Fall Restrictions Weight Bearing Restrictions: No      Mobility Bed Mobility Overal bed mobility: Needs Assistance Bed Mobility: Supine to Sit;Sit to Supine     Supine to sit: Min guard Sit to supine: Supervision   General bed mobility comments: For safety, cues for initiation. HOB slightly elevated with use of bed rails  Transfers Overall transfer level: Needs assistance Equipment used: None Transfers: Sit to/from Stand Sit to Stand: Min assist         General transfer comment: Min assist for steadying balance in standing    Balance Overall  balance assessment: Needs assistance Sitting-balance support: Feet supported;No upper extremity supported Sitting balance-Leahy Scale: Good     Standing balance support: No upper extremity supported;During functional activity Standing balance-Leahy Scale: Poor Standing balance comment: Min assist throughout for balance                           ADL either performed or assessed with clinical judgement   ADL Overall ADL's : Needs assistance/impaired Eating/Feeding: Set up;Sitting   Grooming: Min guard;Standing;Wash/dry hands   Upper Body Bathing: Set up;Supervision/ safety;Sitting   Lower Body Bathing: Moderate assistance;Sit to/from stand   Upper Body Dressing : Set up;Supervision/safety;Sitting   Lower Body Dressing: Moderate assistance;Sit to/from stand   Toilet Transfer: Minimal assistance;Ambulation;Regular Toilet   Toileting- Clothing Manipulation and Hygiene: Sitting/lateral lean;Supervision/safety       Functional mobility during ADLs: Minimal assistance       Vision         Perception     Praxis      Pertinent Vitals/Pain Pain Assessment: Faces Faces Pain Scale: Hurts little more Pain Location: head Pain Descriptors / Indicators: Headache Pain Intervention(s): Monitored during session;Limited activity within patient's tolerance;Patient requesting pain meds-RN notified     Hand Dominance Right   Extremity/Trunk Assessment Upper Extremity Assessment Upper Extremity Assessment: Generalized weakness   Lower Extremity Assessment Lower Extremity Assessment: Defer to PT evaluation       Communication Communication Communication: No difficulties   Cognition Arousal/Alertness: Awake/alert Behavior During Therapy: Flat affect Overall Cognitive Status: Impaired/Different from baseline Area of Impairment: Safety/judgement;Following commands;Problem solving  Following Commands: Follows one step commands  consistently;Follows one step commands with increased time Safety/Judgement: Decreased awareness of safety;Decreased awareness of deficits   Problem Solving: Slow processing;Requires verbal cues General Comments: Pts daughter in law reports impaired cognition from baseline, slower to respond/react   General Comments       Exercises     Shoulder Instructions      Home Living Family/patient expects to be discharged to:: Private residence Living Arrangements: Other relatives(sister) Available Help at Discharge: Family;Available 24 hours/day Type of Home: Apartment Home Access: Level entry     Home Layout: One level     Bathroom Shower/Tub: Occupational psychologist: Standard     Home Equipment: Shower seat   Additional Comments: Per pts daughter in Sports coach; pt lives with his sister who has health conditions and has difficulty caring for him in his current state      Prior Functioning/Environment Level of Independence: Needs assistance  Gait / Transfers Assistance Needed: No use of AD but balance issues with gait ADL's / Homemaking Assistance Needed: Difficulty with LB ADL PTA            OT Problem List: Decreased strength;Decreased activity tolerance;Impaired balance (sitting and/or standing);Decreased cognition;Decreased safety awareness;Decreased knowledge of use of DME or AE;Obesity;Pain      OT Treatment/Interventions: Self-care/ADL training;DME and/or AE instruction;Therapeutic activities;Cognitive remediation/compensation;Patient/family education;Balance training    OT Goals(Current goals can be found in the care plan section) Acute Rehab OT Goals Patient Stated Goal: none stated OT Goal Formulation: With patient Time For Goal Achievement: 06/30/17 Potential to Achieve Goals: Good ADL Goals Pt Will Perform Grooming: with supervision;standing Pt Will Perform Lower Body Bathing: with supervision;sit to/from stand;with adaptive equipment Pt Will Perform  Lower Body Dressing: with supervision;sit to/from stand;with adaptive equipment Pt Will Transfer to Toilet: with supervision;ambulating;regular height toilet Pt Will Perform Toileting - Clothing Manipulation and hygiene: with supervision;sit to/from stand  OT Frequency: Min 2X/week   Barriers to D/C: Decreased caregiver support  sister unable to provide level of care pt requires at this time       Co-evaluation              AM-PAC PT "6 Clicks" Daily Activity     Outcome Measure Help from another person eating meals?: A Little Help from another person taking care of personal grooming?: A Little Help from another person toileting, which includes using toliet, bedpan, or urinal?: A Little Help from another person bathing (including washing, rinsing, drying)?: A Lot Help from another person to put on and taking off regular upper body clothing?: A Little Help from another person to put on and taking off regular lower body clothing?: A Lot 6 Click Score: 16   End of Session Equipment Utilized During Treatment: Gait belt Nurse Communication: Mobility status;Patient requests pain meds  Activity Tolerance: Patient tolerated treatment well Patient left: in bed;with call bell/phone within reach;with family/visitor present  OT Visit Diagnosis: Unsteadiness on feet (R26.81);Muscle weakness (generalized) (M62.81);Pain Pain - part of body: (head)                Time: 5284-1324 OT Time Calculation (min): 20 min Charges:  OT General Charges $OT Visit: 1 Visit OT Evaluation $OT Eval Moderate Complexity: 1 Mod G-Codes:     Hessie Varone A. Ulice Brilliant, M.S., OTR/L Pager: [place] 06/16/2017, 9:06 AM

## 2017-06-16 NOTE — Progress Notes (Signed)
Called to get report but told nurse is busy doing D/C. Will call back.

## 2017-06-17 ENCOUNTER — Encounter (HOSPITAL_COMMUNITY): Payer: Self-pay | Admitting: Physician Assistant

## 2017-06-17 ENCOUNTER — Inpatient Hospital Stay (HOSPITAL_COMMUNITY): Payer: Self-pay

## 2017-06-17 DIAGNOSIS — K7200 Acute and subacute hepatic failure without coma: Secondary | ICD-10-CM

## 2017-06-17 DIAGNOSIS — G9341 Metabolic encephalopathy: Secondary | ICD-10-CM

## 2017-06-17 DIAGNOSIS — D696 Thrombocytopenia, unspecified: Secondary | ICD-10-CM

## 2017-06-17 DIAGNOSIS — K7030 Alcoholic cirrhosis of liver without ascites: Secondary | ICD-10-CM

## 2017-06-17 LAB — COMPREHENSIVE METABOLIC PANEL
ALT: 21 U/L (ref 17–63)
ANION GAP: 8 (ref 5–15)
AST: 45 U/L — ABNORMAL HIGH (ref 15–41)
Albumin: 2.7 g/dL — ABNORMAL LOW (ref 3.5–5.0)
Alkaline Phosphatase: 165 U/L — ABNORMAL HIGH (ref 38–126)
BUN: 10 mg/dL (ref 6–20)
CHLORIDE: 109 mmol/L (ref 101–111)
CO2: 21 mmol/L — AB (ref 22–32)
Calcium: 8.7 mg/dL — ABNORMAL LOW (ref 8.9–10.3)
Creatinine, Ser: 1.02 mg/dL (ref 0.61–1.24)
GFR calc non Af Amer: >60 mL/min (ref >60)
Glucose, Bld: 112 mg/dL — ABNORMAL HIGH (ref 65–99)
Potassium: 3.9 mmol/L (ref 3.5–5.1)
SODIUM: 138 mmol/L (ref 135–145)
Total Bilirubin: 1.7 mg/dL — ABNORMAL HIGH (ref 0.3–1.2)
Total Protein: 6.1 g/dL — ABNORMAL LOW (ref 6.5–8.1)

## 2017-06-17 LAB — GLUCOSE, CAPILLARY: Glucose-Capillary: 112 mg/dL — ABNORMAL HIGH (ref 65–99)

## 2017-06-17 LAB — HEPATITIS PANEL, ACUTE
HEP A IGM: NEGATIVE
Hep B C IgM: NEGATIVE
Hepatitis B Surface Ag: NEGATIVE

## 2017-06-17 LAB — CBC
HCT: 36.5 % — ABNORMAL LOW (ref 39.0–52.0)
Hemoglobin: 12.5 g/dL — ABNORMAL LOW (ref 13.0–17.0)
MCH: 33.4 pg (ref 26.0–34.0)
MCHC: 34.2 g/dL (ref 30.0–36.0)
MCV: 97.6 fL (ref 78.0–100.0)
PLATELETS: 95 10*3/uL — AB (ref 150–400)
RBC: 3.74 MIL/uL — AB (ref 4.22–5.81)
RDW: 15.5 % (ref 11.5–15.5)
WBC: 7 10*3/uL (ref 4.0–10.5)

## 2017-06-17 LAB — AMMONIA
Ammonia: 54 umol/L — ABNORMAL HIGH (ref 9–35)
Ammonia: 29 umol/L (ref 9–35)

## 2017-06-17 MED ORDER — LACTULOSE 10 GM/15ML PO SOLN
20.0000 g | Freq: Two times a day (BID) | ORAL | Status: DC
  Administered 2017-06-18 (×2): 20 g via ORAL
  Filled 2017-06-17 (×2): qty 30

## 2017-06-17 MED ORDER — ENOXAPARIN SODIUM 40 MG/0.4ML ~~LOC~~ SOLN
40.0000 mg | SUBCUTANEOUS | Status: DC
  Administered 2017-06-18 – 2017-06-21 (×4): 40 mg via SUBCUTANEOUS
  Filled 2017-06-17 (×4): qty 0.4

## 2017-06-17 MED ORDER — ENSURE ENLIVE PO LIQD
237.0000 mL | Freq: Two times a day (BID) | ORAL | Status: DC
  Administered 2017-06-17 – 2017-06-19 (×4): 237 mL via ORAL

## 2017-06-17 MED ORDER — KETOROLAC TROMETHAMINE 15 MG/ML IJ SOLN
15.0000 mg | Freq: Once | INTRAMUSCULAR | Status: AC
  Administered 2017-06-17: 15 mg via INTRAVENOUS
  Filled 2017-06-17: qty 1

## 2017-06-17 NOTE — H&P (View-Only) (Signed)
[facility] Consult: 12:49 PM 06/17/2017  LOS: 2 days    Referring Provider: Dr Nevada Crane  Primary Care Physician:  [name], MD Primary Gastroenterologist:  None.       Reason for Consultation:  encephaloptahy in cirrhotic   HPI: Michael Mcfarland is a 52 y.o. male.  PMH alcoholism.  Cirrhosis.  Depression/anxiety, agoraphobia, hoarding disorder..  Bowel incontinence.  S/P cervical spine surgery.  12/2016 seizure with negative head CT.   02/2012 abdominal Ultrasound for evaluation of abnormal LFTs, abdominal pain, nausea, diarrhea.  Study showed echogenic, likely fatty infiltration of the liver.  3 mm CBD.  Normal GB, spleen, pancreas (though latter not completely vis'd) 01/18/17 abdominal ultrasound.  For suspected cirrhosis.  Tiny GB calculi.  5 mm CBD.  Nodular liver compatible with cirrhosis.  No suspicious hepatic masses.  Normal blood flow through the portal vein.  05/20/17-05/23/17 admission following witnessed seizure.  MRI head showed mild brain atrophy.  No seizure on EEG.  Seizure occurred after a couple of days of alcohol abstinence so was likely related to alcohol withdrawal.  LFTs were elevated.  Hepatitis ABC serologies, HIV negative 12/2016, 06/03/17 and 06/15/17.  Discharged on Folic acid, Hydoxizine, Thiamine, Prozac, Trazadone, Bentyl, Klonopin,   Establish care with Dr. [name] on 06/03/17 when he had his initial office visit.  Note mentions that his family was feeling his psychiatric issues were prominent and that his house was "unlivable ".  He had previously been on psychiatric meds but none recently.  As of that office visit he had not had a drink since the 05/20/17 hospital discharge.  Office-based psychiatric testing with delayed word recall and unable to draw clock face for "10 after 2".  Dr.  [name] diagnosed him with cognitive impairment and gait abnormality.  He planned neurology referral.  He started him on hydroxyzine for his anxiety, preferring to avoid benzos.  Encouraged patient to continue to abstain from alcohol.  Regarding his cirrhosis and diarrhea he arranged office visit with Dr. [name] on 07/28/17.  Increasingly weak and confused since discharge and overall on downward curve for several months.  Difficulty cutting his food up to eat and eating poorly, No N/V.  Unable to put on his shoes.  "Going downhill" per pt's sister who is making inquiries into assisted living or other skilled placement.  They are in process of helping him clean up and move out of his home.   Increased abdominal girth over last several weeks. Takes up to 10 Excedrin per week for headaches.  Less frequent use of Ibuprofen. No bloody stool. No abd pain. Watery stools >/= 8 x day since started Lactulose as inpt 3 d ago. Pt brought by family to ED 3/19 and now admitted.   Since admission: T bili 2.2 >> 2.9 >> 1.7.  Alk phos 192 >> 165.  AST/ALT 60/26 >> 445/21. Na normal at 138.  Renal function normal.  PT/INR 17.4/1.44 platelets 108 >> 95.  Hgb 14.1 >> 12.5.  MCV 96.  WBCs normal.  APAP, salicylate levels nml,  Tox screen + for benzos.  Dr Parker specifically outlined in 3/7 note avoiding benzos but pt using Valium remaining from previous Rx. 3/19 CT head normal.   2 view chest x-ray showed stable chronic bronchitic/smoking-related changes.  Incidental partial T12 compression fracture and multi-level degenerative disc disease in  thoracic spine 3/20 MRI brain repeated still showing mild atrophy and nothing acute 3/21 ultrasound abdomen shows uncomplicated cholelithiasis.  4 mm CBD.  Cirrhotic changes in the liver with normal hepatopetal flow.  No ascites.  Family Hx + for NASH cirrhosis in his sister [name]. A pt of Dr Perry's who died with complications of cirrhosis in 09/2016.  His mom died due to  complications of age.    Past Medical History:  Diagnosis Date  . Alcohol abuse    /notes 01/18/2017  . Alcohol related seizure (HCC) 01/17/2017   /notes 01/17/2017  . Anxiety   . Arthritis   . 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 Depression   . Emphysema lung (HCC)   . Frequent headaches   . GERD (gastroesophageal reflux disease)   . Hay fever   . Migraine    frequency "depends on the weather" (01/19/2017)  . Panic attacks   . Pneumonia   . Seizure (HCC) 05/20/2017 X 2  . Stroke (HCC)     Past Surgical History:  Procedure Laterality Date  . ANTERIOR CERVICAL DECOMP/DISCECTOMY FUSION    . BACK SURGERY      Prior to Admission medications   Medication Sig Start Date End Date Taking? Authorizing Provider  folic acid (FOLVITE) 1 MG tablet Take 1 tablet (1 mg total) by mouth daily. 06/03/17  Yes Parker, Caleb M, MD  hydrOXYzine (ATARAX/VISTARIL) 50 MG tablet Take 1 tablet (50 mg total) by mouth 3 (three) times daily as needed for anxiety (insomina). 06/03/17  Yes Parker, Caleb M, MD  thiamine 100 MG tablet Take 1 tablet (100 mg total) by mouth daily. 06/03/17  Yes Parker, Caleb M, MD    Scheduled Meds: . enoxaparin (LOVENOX) injection  40 mg Subcutaneous Q24H  . folic acid  1 mg Oral Daily  . lactulose  30 g Oral TID  . nicotine  21 mg Transdermal Daily  . thiamine  100 mg Oral Daily   Infusions: . sodium chloride 100 mL/hr at 06/17/17 0454   PRN Meds: ibuprofen, LORazepam, ondansetron **OR** ondansetron (ZOFRAN) IV, zolpidem   Allergies as of 06/15/2017  . (No Known Allergies)    Family History  Problem Relation Age of Onset  . Diabetes Mother   . Depression Mother   . Hypertension Mother   . Arthritis Mother   . Arthritis Father   . Heart disease Father   . Heart disease Brother   . Diabetes Sister     Social History   Socioeconomic History  . Marital status: Widowed    Spouse name: Not on file  . Number of children: Not on file  . Years of education:  Not on file  . Highest education level: Not on file  Occupational History  . 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    Social Needs  . Financial resource strain: Not on file  . Food insecurity:    Worry: Not on file    Inability: Not on file  . Transportation needs:    Medical: Not on file    Non-medical: Not on file  Tobacco   Use  . Smoking status: Current Every Day Smoker    Packs/day: 0.25    Years: 35.00    Pack years: 8.75    Types: Cigarettes    Last attempt to quit: 12/28/2016    Years since quitting: 0.4  . Smokeless tobacco: Never Used  . Tobacco comment: quit smoking after admission 04/2017  Substance and Sexual Activity  . Alcohol use: Not Currently    Alcohol/week: 10.8 oz    Types: 18 Cans of beer per week    Comment: until admission 04/2017 drinking 12 to 18 beers daily.  since then, no beer or other ETOH (as of 06/17/17)  . Drug use: Not Currently    Comment: 05/20/2017 "been a long time; I tried different things when I was younger"  . Sexual activity: Not Currently    Comment: widowed in 2011  Lifestyle  . Physical activity:    Days per week: Not on file    Minutes per session: Not on file  . Stress: Not on file  Relationships  . Social connections:    Talks on phone: Not on file    Gets together: Not on file    Attends religious service: Not on file    Active member of club or organization: Not on file    Attends meetings of clubs or organizations: Not on file    Relationship status: Not on file  . Intimate partner violence:    Fear of current or ex partner: Not on file    Emotionally abused: Not on file    Physically abused: Not on file    Forced sexual activity: Not on file  Other Topics Concern  . Not on file  Social History Narrative  . Not on file    REVIEW OF SYSTEMS: Constitutional:  Weakness, ENT:  No nose bleeds Pulm:  Cough, occasional non-purulent  sputum CV:  No palpitations, no LE edema.  GU:  No hematuria, no frequency GI:  Per HPI Heme:  No unusual bleeding or bruising.     Transfusions:  none Neuro:  + headaches.  No seizure since mid 04/2017.   Derm:  No itching, no rash or sores.  Endocrine:  No sweats or chills.  No polyuria or dysuria Immunization:  12/2016 flu shot.   Travel:  None beyond local counties in last few months.   Remainder of systems negative except as above  PHYSICAL EXAM: Vital signs in last 24 hours: Vitals:   06/17/17 0429 06/17/17 0810  BP: 100/64 139/86  Pulse: 81 (!) 102  Resp: 15   Temp: 98.2 F (36.8 C) 98.7 F (37.1 C)  SpO2: 92%    Wt Readings from Last 3 Encounters:  06/15/17 215 lb 1.6 oz (97.6 kg)  06/03/17 215 lb 9.6 oz (97.8 kg)  05/23/17 228 lb 12.8 oz (103.8 kg)    General: Overweight, moderately ill, alert WM. Head: No facial asymmetry.  No signs of head trauma. Eyes: No scleral icterus.  No conjunctival pallor.  EOMI. Ears: Not HOH Nose: No congestion or discharge. Mouth: Moist, clear, pink oral mucosa.  Good dentition.  Tongue midline. Neck: No JVD, no masses, no thyromegaly. Lungs: Globally diminished breath sounds.  No dyspnea.  No adventitious sounds. Heart: RRR.  No MRG.  S1, S2 present. Abdomen: Soft.  Obese, mildly distended.  Not tender.  No HSM, masses, bruits, hernias..   Rectal: Deferred Musc/Skeltl: No significant joint swelling, redness or deformity. Extremities: No CCE. Neurologic: Alert.  Oriented x3.    Slight tremor, possible mild asterixis on wrist flexion test. Skin: No jaundice.  No telangiectasia. Tattoos: 1 tattoo in the image of a ring on his left fourth finger. Nodes: No cervical or inguinal adenopathy. Psych: Pleasant, cooperative.  Intake/Output from previous day: 03/20 0701 - 03/21 0700 In: 2513.3 [P.O.:840; I.V.:1673.3] Out: -  Intake/Output this shift: No intake/output data recorded.  LAB RESULTS: Recent Labs    06/15/17 1309  06/16/17 0037 06/17/17 0238  WBC 6.7 6.9 7.0  HGB 15.1 14.1 12.5*  HCT 43.1 40.6 36.5*  PLT 109* 108* 95*   BMET Lab Results  Component Value Date   NA 138 06/17/2017   NA 138 06/16/2017   NA 137 06/15/2017   K 3.9 06/17/2017   K 4.1 06/16/2017   K 4.3 06/15/2017   CL 109 06/17/2017   CL 106 06/16/2017   CL 105 06/15/2017   CO2 21 (L) 06/17/2017   CO2 22 06/16/2017   CO2 24 06/15/2017   GLUCOSE 112 (H) 06/17/2017   GLUCOSE 82 06/16/2017   GLUCOSE 121 (H) 06/15/2017   BUN 10 06/17/2017   BUN 9 06/16/2017   BUN 7 06/15/2017   CREATININE 1.02 06/17/2017   CREATININE 1.07 06/16/2017   CREATININE 1.01 06/15/2017   CALCIUM 8.7 (L) 06/17/2017   CALCIUM 9.2 06/16/2017   CALCIUM 9.3 06/15/2017   LFT Recent Labs    06/15/17 1655 06/16/17 0037 06/17/17 0238  PROT 7.2 7.2 6.1*  ALBUMIN 3.1* 3.1* 2.7*  AST 60* 53* 45*  ALT _0 ALKPHOS 192* 179* 165*  BILITOT 2.2* 2.9* 1.7*  BILIDIR 0.6*  --   --   IBILI 1.6*  --   --    PT/INR Lab Results  Component Value Date   INR 1.44 06/15/2017   INR 1.3 (H) 06/03/2017   INR 1.37 01/20/2017   Hepatitis Panel Recent Labs    06/15/17 2323  HEPBSAG Negative  HCVAB <0.1  HEPAIGM Negative  HEPBIGM Negative   C-Diff No components found for: CDIFF Lipase     Component Value Date/Time   LIPASE 27 06/15/2017 1655    Drugs of Abuse     Component Value Date/Time   LABOPIA NONE DETECTED 06/15/2017 1705   COCAINSCRNUR NONE DETECTED 06/15/2017 1705   LABBENZ POSITIVE (A) 06/15/2017 1705   AMPHETMU NONE DETECTED 06/15/2017 1705   THCU NONE DETECTED 06/15/2017 1705   LABBARB NONE DETECTED 06/15/2017 1705     RADIOLOGY STUDIES: Dg Chest 2 View  Result Date: 06/15/2017 CLINICAL DATA:  Recent hospitalization for seizure activity. The patient has been coming progressively weaker and is unable to function independently. The patient has lost control of his bowels. EXAM: CHEST - 2 VIEW COMPARISON:  PA and lateral chest  x-ray of May 20, 2017 FINDINGS: The lungs are well-expanded. The interstitial markings are coarse though stable. The heart and pulmonary vascularity are normal. There is calcification in the wall of the aortic arch. There is no pleural effusion. There is chronic partial compression of the body of T12. There is mild multilevel degenerative disc disease of the thoracic spine. IMPRESSION: There is no acute pneumonia nor pulmonary edema. There are stable chronic bronchitic-smoking related changes. Electronically Signed   By: David  Martinique M.D.   On: 06/15/2017 16:38   Ct Head Wo Contrast  Result Date: 06/15/2017 CLINICAL DATA:  Altered level of consciousness, seizures, history of ethanol abuse, emphysema, GERD, stroke, smoker EXAM: CT HEAD WITHOUT CONTRAST TECHNIQUE: Contiguous axial images were obtained from  the base of the skull through the vertex without intravenous contrast. COMPARISON:  05/20/2017 FINDINGS: Brain: Normal ventricular morphology. No midline shift or mass effect. Normal appearance of brain parenchyma. No intracranial hemorrhage, mass lesion, evidence of acute infarction, or extra-axial fluid collection. Vascular: No hyperdense vessels Skull: Intact Sinuses/Orbits: Clear Other: N/A IMPRESSION: Normal exam. Electronically Signed   By: Mark  Boles M.D.   On: 06/15/2017 18:47   Mr Brain W Wo Contrast  Result Date: 06/16/2017 CLINICAL DATA:  Altered mental status EXAM: MRI HEAD WITHOUT AND WITH CONTRAST TECHNIQUE: Multiplanar, multiecho pulse sequences of the brain and surrounding structures were obtained without and with intravenous contrast. CONTRAST:  20mL MULTIHANCE GADOBENATE DIMEGLUMINE 529 MG/ML IV SOLN COMPARISON:  Head CT 06/15/2017 Brain MRI 05/21/2017 FINDINGS: Brain: The midline structures are normal. There is no acute infarct or acute hemorrhage. No mass lesion, hydrocephalus, dural abnormality or extra-axial collection. Mild periventricular white matter hyperintensity. Mild  atrophy. No chronic microhemorrhage or superficial siderosis. Vascular: Major intracranial arterial and venous sinus flow voids are preserved. Skull and upper cervical spine: The visualized skull base, calvarium, upper cervical spine and extracranial soft tissues are normal. Sinuses/Orbits: No fluid levels or advanced mucosal thickening. No mastoid or middle ear effusion. Normal orbits. IMPRESSION: Mild atrophy without acute intracranial abnormality. Electronically Signed   By: Kevin  Herman M.D.   On: 06/16/2017 22:22   Us Abdomen Complete  Result Date: 06/17/2017 CLINICAL DATA:  Cirrhosis EXAM: ABDOMEN ULTRASOUND COMPLETE COMPARISON:  01/18/2017 FINDINGS: Gallbladder: Gallbladder is well distended. Multiple calculi are seen. No gallbladder wall thickening or pericholecystic fluid is noted. Common bile duct: Diameter: 4 mm. Liver: Mild nodularity of the liver is noted consistent with the given clinical history of cirrhosis. No underlying mass is seen. Portal vein is patent on color Doppler imaging with normal direction of blood flow towards the liver. IVC: No abnormality visualized. Pancreas: Visualized portion unremarkable. Spleen: Size and appearance within normal limits. Right Kidney: Length: 12.1 cm. Echogenicity within normal limits. No mass or hydronephrosis visualized. Left Kidney: Length: 11.5 cm. Echogenicity within normal limits. No mass or hydronephrosis visualized. Abdominal aorta: Not well visualized due to overlying bowel gas. Other findings: None. IMPRESSION: Cholelithiasis without complicating factors. Changes of cirrhosis. Electronically Signed   By: Mark  Lukens M.D.   On: 06/17/2017 12:36     IMPRESSION:   *    Cirrhosis due to ETOH.   MELD-Na 13 as of 3/19 labs.  Discriminant fx score 27.    *     Worsening cognitive impairment in pt with psychiatric disorders.    Ammonia mildly elevated at 54.  Cognition improved over last 3 days as inpt ? Is this due to not taking benzos vs  receiving Lactulose? Brain imaging with MRI/CT showing mild atrophy.  No ascites on ultrasound.   Note the tox screen + for benzos.  Dr Parker specifically outlined in 3/7 note avoiding benzos.    *    Coagulopathy.    *   Thrombocytopenia.  No splenomegaly on ultrasounds.      PLAN:     *  Target dose of Lactuose to produce 3 stools per day.  Family not eager to have Rifaximin RXd given $800/month cost   *  Is Nicoderm necessary given that pt had not smoked since 2/21?      Sarah Gribbin  06/17/2017, 12:49 PM Pager: [phone]   I have reviewed the entire case in detail with the above APP and discussed the plan in   detail.  Therefore, I agree with the diagnoses recorded above. In addition,  I have personally interviewed and examined the patient and have personally reviewed any abdominal/pelvic CT scan images.  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  He was admitted for altered mental status and generalized deconditioning.  I believe his altered mental status is multifactorial from alcohol related neurologic changes, toxic/metabolic encephalopathy from sedating meds that he was taking, and hepatic encephalopathy.   He is negative for viral hepatitis, has no ascites or liver mass.  I stressed the importance of indefinite alcohol abstinence.  I have ordered an AFP with tomorrow morning's labs to complete his hepatoma screening, and I also advised him to have an upper endoscopy to screen for esophageal varices.  I think this would be easier for him to have it done prior to discharge rather than as an outpatient.  He is agreeable after thorough discussion of procedure and risks.  His nieces were present for the entire encounter, and everyone's questions were answered to their satisfaction.   The benefits and risks of the planned procedure were described in detail with the patient or (when appropriate) their health  care proxy.  Risks were outlined as including, but not limited to, bleeding, infection, perforation, adverse medication reaction leading to cardiac or pulmonary decompensation, or pancreatitis (if ERCP).  The limitation of incomplete mucosal visualization was also discussed.  No guarantees or warranties were given. Patient at increased risk for cardiopulmonary complications of procedure due to medical comorbidities.   We will arrange outpatient follow-up after the hospital discharge and he will also need close follow-up with primary care for appropriate vaccinations and other general care management.  Nelida Meuse III Pager [phone]  Mon-Fri 8a-5p [phone] after 5p, weekends, holidays

## 2017-06-17 NOTE — Care Management Note (Deleted)
Case Management Note Marvetta Gibbons RN, BSN Unit 4E-Case Manager [phone]  Patient Details  Name: MAJOUR FREI MRN: [id number] Date of Birth: [birth year]  Subjective/Objective:   Pt admitted with hepatic encephalopathy                  Action/Plan: PTA pt lived at home with sister, per PT eval recommendation for SNF- CSW to follow for possible SNF placement  Expected Discharge Date:  05/23/17               Expected Discharge Plan:  Skilled Nursing Facility  In-House Referral:  Clinical Social Work  Discharge planning Services  CM Consult  Post Acute Care Choice:    Choice offered to:     DME Arranged:    DME Agency:     HH Arranged:    [place] Agency:     Status of Service:  In process, will continue to follow  If discussed at Long Length of Stay Meetings, dates discussed:    Discharge Disposition:   Additional Comments:  Dawayne Patricia, RN 06/17/2017, 10:25 AM

## 2017-06-17 NOTE — Consult Note (Addendum)
                                                                           [facility] Consult: 12:49 PM 06/17/2017  LOS: 2 days    Referring Provider: Dr Hall  Primary Care Physician:  Mcfarland, Michael M, MD Primary Gastroenterologist:  None.       Reason for Consultation:  encephaloptahy in cirrhotic   HPI: Michael Mcfarland is a 51 y.o. male.  PMH alcoholism.  Cirrhosis.  Depression/anxiety, agoraphobia, hoarding disorder..  Bowel incontinence.  S/P cervical spine surgery.  12/2016 seizure with negative head CT.   02/2012 abdominal Ultrasound for evaluation of abnormal LFTs, abdominal pain, nausea, diarrhea.  Study showed echogenic, likely fatty infiltration of the liver.  3 mm CBD.  Normal GB, spleen, pancreas (though latter not completely vis'd) 01/18/17 abdominal ultrasound.  For suspected cirrhosis.  Tiny GB calculi.  5 mm CBD.  Nodular liver compatible with cirrhosis.  No suspicious hepatic masses.  Normal blood flow through the portal vein.  05/20/17-05/23/17 admission following witnessed seizure.  MRI head showed mild brain atrophy.  No seizure on EEG.  Seizure occurred after a couple of days of alcohol abstinence so was likely related to alcohol withdrawal.  LFTs were elevated.  Hepatitis ABC serologies, HIV negative 12/2016, 06/03/17 and 06/15/17.  Discharged on Folic acid, Hydoxizine, Thiamine, Prozac, Trazadone, Bentyl, Klonopin,   Establish care with Dr. Parker on 06/03/17 when he had his initial office visit.  Note mentions that his family was feeling his psychiatric issues were prominent and that his house was "unlivable ".  He had previously been on psychiatric meds but none recently.  As of that office visit he had not had a drink since the 05/20/17 hospital discharge.  Office-based psychiatric testing with delayed word recall and unable to draw clock face for "10 after 2".  Dr.  Parker diagnosed him with cognitive impairment and gait abnormality.  He planned neurology referral.  He started him on hydroxyzine for his anxiety, preferring to avoid benzos.  Encouraged patient to continue to abstain from alcohol.  Regarding his cirrhosis and diarrhea he arranged office visit with Dr. John Mcfarland on 07/28/17.  Increasingly weak and confused since discharge and overall on downward curve for several months.  Difficulty cutting his food up to eat and eating poorly, No N/V.  Unable to put on his shoes.  "Going downhill" per pt's sister who is making inquiries into assisted living or other skilled placement.  They are in process of helping him clean up and move out of his home.   Increased abdominal girth over last several weeks. Takes up to 10 Excedrin per week for headaches.  Less frequent use of Ibuprofen. No bloody stool. No abd pain. Watery stools >/= 8 x day since started Lactulose as inpt 3 d ago. Pt brought by family to ED 3/19 and now admitted.   Since admission: T bili 2.2 >> 2.9 >> 1.7.  Alk phos 192 >> 165.  AST/ALT 60/26 >> 445/21. Na normal at 138.  Renal function normal.  PT/INR 17.4/1.44 platelets 108 >> 95.  Hgb 14.1 >> 12.5.  MCV 96.  WBCs normal.  APAP, salicylate levels nml,     Tox screen + for benzos.  Dr Mcfarland specifically outlined in 3/7 note avoiding benzos but pt using Valium remaining from previous Rx. 3/19 CT head normal.   2 view chest x-ray showed stable chronic bronchitic/smoking-related changes.  Incidental partial T12 compression fracture and multi-level degenerative disc disease in  thoracic spine 3/20 MRI brain repeated still showing mild atrophy and nothing acute 3/21 ultrasound abdomen shows uncomplicated cholelithiasis.  4 mm CBD.  Cirrhotic changes in the liver with normal hepatopetal flow.  No ascites.  Family Hx + for NASH cirrhosis in his sister Michael Mcfarland. A pt of Dr Mcfarland's who died with complications of cirrhosis in 09/2016.  His mom died due to  complications of age.    Past Medical History:  Diagnosis Date  . Alcohol abuse    /notes 01/18/2017  . Alcohol related seizure (HCC) 01/17/2017   /notes 01/17/2017  . Anxiety   . Arthritis   . 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 Depression   . Emphysema lung (HCC)   . Frequent headaches   . GERD (gastroesophageal reflux disease)   . Hay fever   . Migraine    frequency "depends on the weather" (01/19/2017)  . Panic attacks   . Pneumonia   . Seizure (HCC) 05/20/2017 X 2  . Stroke (HCC)     Past Surgical History:  Procedure Laterality Date  . ANTERIOR CERVICAL DECOMP/DISCECTOMY FUSION    . BACK SURGERY      Prior to Admission medications   Medication Sig Start Date End Date Taking? Authorizing Provider  folic acid (FOLVITE) 1 MG tablet Take 1 tablet (1 mg total) by mouth daily. 06/03/17  Yes Mcfarland, Michael M, MD  hydrOXYzine (ATARAX/VISTARIL) 50 MG tablet Take 1 tablet (50 mg total) by mouth 3 (three) times daily as needed for anxiety (insomina). 06/03/17  Yes Mcfarland, Michael M, MD  thiamine 100 MG tablet Take 1 tablet (100 mg total) by mouth daily. 06/03/17  Yes Mcfarland, Michael M, MD    Scheduled Meds: . enoxaparin (LOVENOX) injection  40 mg Subcutaneous Q24H  . folic acid  1 mg Oral Daily  . lactulose  30 g Oral TID  . nicotine  21 mg Transdermal Daily  . thiamine  100 mg Oral Daily   Infusions: . sodium chloride 100 mL/hr at 06/17/17 0454   PRN Meds: ibuprofen, LORazepam, ondansetron **OR** ondansetron (ZOFRAN) IV, zolpidem   Allergies as of 06/15/2017  . (No Known Allergies)    Family History  Problem Relation Age of Onset  . Diabetes Mother   . Depression Mother   . Hypertension Mother   . Arthritis Mother   . Arthritis Father   . Heart disease Father   . Heart disease Brother   . Diabetes Sister     Social History   Socioeconomic History  . Marital status: Widowed    Spouse name: Not on file  . Number of children: Not on file  . Years of education:  Not on file  . Highest education level: Not on file  Occupational History  . 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    Social Needs  . Financial resource strain: Not on file  . Food insecurity:    Worry: Not on file    Inability: Not on file  . Transportation needs:    Medical: Not on file    Non-medical: Not on file  Tobacco   Use  . Smoking status: Current Every Day Smoker    Packs/day: 0.25    Years: 35.00    Pack years: 8.75    Types: Cigarettes    Last attempt to quit: 12/28/2016    Years since quitting: 0.4  . Smokeless tobacco: Never Used  . Tobacco comment: quit smoking after admission 04/2017  Substance and Sexual Activity  . Alcohol use: Not Currently    Alcohol/week: 10.8 oz    Types: 18 Cans of beer per week    Comment: until admission 04/2017 drinking 12 to 18 beers daily.  since then, no beer or other ETOH (as of 06/17/17)  . Drug use: Not Currently    Comment: 05/20/2017 "been a long time; I tried different things when I was younger"  . Sexual activity: Not Currently    Comment: widowed in 2011  Lifestyle  . Physical activity:    Days per week: Not on file    Minutes per session: Not on file  . Stress: Not on file  Relationships  . Social connections:    Talks on phone: Not on file    Gets together: Not on file    Attends religious service: Not on file    Active member of club or organization: Not on file    Attends meetings of clubs or organizations: Not on file    Relationship status: Not on file  . Intimate partner violence:    Fear of current or ex partner: Not on file    Emotionally abused: Not on file    Physically abused: Not on file    Forced sexual activity: Not on file  Other Topics Concern  . Not on file  Social History Narrative  . Not on file    REVIEW OF SYSTEMS: Constitutional:  Weakness, ENT:  No nose bleeds Pulm:  Cough, occasional non-purulent  sputum CV:  No palpitations, no LE edema.  GU:  No hematuria, no frequency GI:  Per HPI Heme:  No unusual bleeding or bruising.     Transfusions:  none Neuro:  + headaches.  No seizure since mid 04/2017.   Derm:  No itching, no rash or sores.  Endocrine:  No sweats or chills.  No polyuria or dysuria Immunization:  12/2016 flu shot.   Travel:  None beyond local counties in last few months.   Remainder of systems negative except as above  PHYSICAL EXAM: Vital signs in last 24 hours: Vitals:   06/17/17 0429 06/17/17 0810  BP: 100/64 139/86  Pulse: 81 (!) 102  Resp: 15   Temp: 98.2 F (36.8 C) 98.7 F (37.1 C)  SpO2: 92%    Wt Readings from Last 3 Encounters:  06/15/17 215 lb 1.6 oz (97.6 kg)  06/03/17 215 lb 9.6 oz (97.8 kg)  05/23/17 228 lb 12.8 oz (103.8 kg)    General: Overweight, moderately ill, alert WM. Head: No facial asymmetry.  No signs of head trauma. Eyes: No scleral icterus.  No conjunctival pallor.  EOMI. Ears: Not HOH Nose: No congestion or discharge. Mouth: Moist, clear, pink oral mucosa.  Good dentition.  Tongue midline. Neck: No JVD, no masses, no thyromegaly. Lungs: Globally diminished breath sounds.  No dyspnea.  No adventitious sounds. Heart: RRR.  No MRG.  S1, S2 present. Abdomen: Soft.  Obese, mildly distended.  Not tender.  No HSM, masses, bruits, hernias..   Rectal: Deferred Musc/Skeltl: No significant joint swelling, redness or deformity. Extremities: No CCE. Neurologic: Alert.  Oriented x3.    Slight tremor, possible mild asterixis on wrist flexion test. Skin: No jaundice.  No telangiectasia. Tattoos: 1 tattoo in the image of a ring on his left fourth finger. Nodes: No cervical or inguinal adenopathy. Psych: Pleasant, cooperative.  Intake/Output from previous day: 03/20 0701 - 03/21 0700 In: 2513.3 [P.O.:840; I.V.:1673.3] Out: -  Intake/Output this shift: No intake/output data recorded.  LAB RESULTS: Recent Labs    06/15/17 1309  06/16/17 0037 06/17/17 0238  WBC 6.7 6.9 7.0  HGB 15.1 14.1 12.5*  HCT 43.1 40.6 36.5*  PLT 109* 108* 95*   BMET Lab Results  Component Value Date   NA 138 06/17/2017   NA 138 06/16/2017   NA 137 06/15/2017   K 3.9 06/17/2017   K 4.1 06/16/2017   K 4.3 06/15/2017   CL 109 06/17/2017   CL 106 06/16/2017   CL 105 06/15/2017   CO2 21 (L) 06/17/2017   CO2 22 06/16/2017   CO2 24 06/15/2017   GLUCOSE 112 (H) 06/17/2017   GLUCOSE 82 06/16/2017   GLUCOSE 121 (H) 06/15/2017   BUN 10 06/17/2017   BUN 9 06/16/2017   BUN 7 06/15/2017   CREATININE 1.02 06/17/2017   CREATININE 1.07 06/16/2017   CREATININE 1.01 06/15/2017   CALCIUM 8.7 (L) 06/17/2017   CALCIUM 9.2 06/16/2017   CALCIUM 9.3 06/15/2017   LFT Recent Labs    06/15/17 1655 06/16/17 0037 06/17/17 0238  PROT 7.2 7.2 6.1*  ALBUMIN 3.1* 3.1* 2.7*  AST 60* 53* 45*  ALT 26 25 21  ALKPHOS 192* 179* 165*  BILITOT 2.2* 2.9* 1.7*  BILIDIR 0.6*  --   --   IBILI 1.6*  --   --    PT/INR Lab Results  Component Value Date   INR 1.44 06/15/2017   INR 1.3 (H) 06/03/2017   INR 1.37 01/20/2017   Hepatitis Panel Recent Labs    06/15/17 2323  HEPBSAG Negative  HCVAB <0.1  HEPAIGM Negative  HEPBIGM Negative   C-Diff No components found for: CDIFF Lipase     Component Value Date/Time   LIPASE 27 06/15/2017 1655    Drugs of Abuse     Component Value Date/Time   LABOPIA NONE DETECTED 06/15/2017 1705   COCAINSCRNUR NONE DETECTED 06/15/2017 1705   LABBENZ POSITIVE (A) 06/15/2017 1705   AMPHETMU NONE DETECTED 06/15/2017 1705   THCU NONE DETECTED 06/15/2017 1705   LABBARB NONE DETECTED 06/15/2017 1705     RADIOLOGY STUDIES: Dg Chest 2 View  Result Date: 06/15/2017 CLINICAL DATA:  Recent hospitalization for seizure activity. The patient has been coming progressively weaker and is unable to function independently. The patient has lost control of his bowels. EXAM: CHEST - 2 VIEW COMPARISON:  PA and lateral chest  x-ray of May 20, 2017 FINDINGS: The lungs are well-expanded. The interstitial markings are coarse though stable. The heart and pulmonary vascularity are normal. There is calcification in the wall of the aortic arch. There is no pleural effusion. There is chronic partial compression of the body of T12. There is mild multilevel degenerative disc disease of the thoracic spine. IMPRESSION: There is no acute pneumonia nor pulmonary edema. There are stable chronic bronchitic-smoking related changes. Electronically Signed   By: Michael  Mcfarland M.D.   On: 06/15/2017 16:38   Ct Head Wo Contrast  Result Date: 06/15/2017 CLINICAL DATA:  Altered level of consciousness, seizures, history of ethanol abuse, emphysema, GERD, stroke, smoker EXAM: CT HEAD WITHOUT CONTRAST TECHNIQUE: Contiguous axial images were obtained from   the base of the skull through the vertex without intravenous contrast. COMPARISON:  05/20/2017 FINDINGS: Brain: Normal ventricular morphology. No midline shift or mass effect. Normal appearance of brain parenchyma. No intracranial hemorrhage, mass lesion, evidence of acute infarction, or extra-axial fluid collection. Vascular: No hyperdense vessels Skull: Intact Sinuses/Orbits: Clear Other: N/A IMPRESSION: Normal exam. Electronically Signed   By: Michael  Mcfarland M.D.   On: 06/15/2017 18:47   Mr Brain W Wo Contrast  Result Date: 06/16/2017 CLINICAL DATA:  Altered mental status EXAM: MRI HEAD WITHOUT AND WITH CONTRAST TECHNIQUE: Multiplanar, multiecho pulse sequences of the brain and surrounding structures were obtained without and with intravenous contrast. CONTRAST:  20mL MULTIHANCE GADOBENATE DIMEGLUMINE 529 MG/ML IV SOLN COMPARISON:  Head CT 06/15/2017 Brain MRI 05/21/2017 FINDINGS: Brain: The midline structures are normal. There is no acute infarct or acute hemorrhage. No mass lesion, hydrocephalus, dural abnormality or extra-axial collection. Mild periventricular white matter hyperintensity. Mild  atrophy. No chronic microhemorrhage or superficial siderosis. Vascular: Major intracranial arterial and venous sinus flow voids are preserved. Skull and upper cervical spine: The visualized skull base, calvarium, upper cervical spine and extracranial soft tissues are normal. Sinuses/Orbits: No fluid levels or advanced mucosal thickening. No mastoid or middle ear effusion. Normal orbits. IMPRESSION: Mild atrophy without acute intracranial abnormality. Electronically Signed   By: Michael  Mcfarland M.D.   On: 06/16/2017 22:22   Us Abdomen Complete  Result Date: 06/17/2017 CLINICAL DATA:  Cirrhosis EXAM: ABDOMEN ULTRASOUND COMPLETE COMPARISON:  01/18/2017 FINDINGS: Gallbladder: Gallbladder is well distended. Multiple calculi are seen. No gallbladder wall thickening or pericholecystic fluid is noted. Common bile duct: Diameter: 4 mm. Liver: Mild nodularity of the liver is noted consistent with the given clinical history of cirrhosis. No underlying mass is seen. Portal vein is patent on color Doppler imaging with normal direction of blood flow towards the liver. IVC: No abnormality visualized. Pancreas: Visualized portion unremarkable. Spleen: Size and appearance within normal limits. Right Kidney: Length: 12.1 cm. Echogenicity within normal limits. No mass or hydronephrosis visualized. Left Kidney: Length: 11.5 cm. Echogenicity within normal limits. No mass or hydronephrosis visualized. Abdominal aorta: Not well visualized due to overlying bowel gas. Other findings: None. IMPRESSION: Cholelithiasis without complicating factors. Changes of cirrhosis. Electronically Signed   By: Michael  Mcfarland M.D.   On: 06/17/2017 12:36     IMPRESSION:   *    Cirrhosis due to ETOH.   MELD-Na 13 as of 3/19 labs.  Discriminant fx score 27.    *     Worsening cognitive impairment in pt with psychiatric disorders.    Ammonia mildly elevated at 54.  Cognition improved over last 3 days as inpt ? Is this due to not taking benzos vs  receiving Lactulose? Brain imaging with MRI/CT showing mild atrophy.  No ascites on ultrasound.   Note the tox screen + for benzos.  Dr Mcfarland specifically outlined in 3/7 note avoiding benzos.    *    Coagulopathy.    *   Thrombocytopenia.  No splenomegaly on ultrasounds.      PLAN:     *  Target dose of Lactuose to produce 3 stools per day.  Family not eager to have Rifaximin RXd given $800/month cost   *  Is Nicoderm necessary given that pt had not smoked since 2/21?      Michael Mcfarland  06/17/2017, 12:49 PM Pager: [phone]   I have reviewed the entire case in detail with the above APP and discussed the plan in   detail.  Therefore, I agree with the diagnoses recorded above. In addition,  I have personally interviewed and examined the patient and have personally reviewed any abdominal/pelvic CT scan images.  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  He was admitted for altered mental status and generalized deconditioning.  I believe his altered mental status is multifactorial from alcohol related neurologic changes, toxic/metabolic encephalopathy from sedating meds that he was taking, and hepatic encephalopathy.   He is negative for viral hepatitis, has no ascites or liver mass.  I stressed the importance of indefinite alcohol abstinence.  I have ordered an AFP with tomorrow morning's labs to complete his hepatoma screening, and I also advised him to have an upper endoscopy to screen for esophageal varices.  I think this would be easier for him to have it done prior to discharge rather than as an outpatient.  He is agreeable after thorough discussion of procedure and risks.  His nieces were present for the entire encounter, and everyone's questions were answered to their satisfaction.   The benefits and risks of the planned procedure were described in detail with the patient or (when appropriate) their health  care proxy.  Risks were outlined as including, but not limited to, bleeding, infection, perforation, adverse medication reaction leading to cardiac or pulmonary decompensation, or pancreatitis (if ERCP).  The limitation of incomplete mucosal visualization was also discussed.  No guarantees or warranties were given. Patient at increased risk for cardiopulmonary complications of procedure due to medical comorbidities.   We will arrange outpatient follow-up after the hospital discharge and he will also need close follow-up with primary care for appropriate vaccinations and other general care management.  Michael Mcfarland Pager [phone]  Mon-Fri 8a-5p [phone] after 5p, weekends, holidays     

## 2017-06-17 NOTE — Progress Notes (Signed)
Pt w/ lateral transfer to 3E22. Report give to [facility]. All belongings w/ pt.

## 2017-06-17 NOTE — Clinical Social Work Note (Signed)
Clinical Social Work Assessment  Patient Details  Name: Michael Mcfarland MRN: [id number] Date of Birth: July 15, 1965  Date of referral:  06/17/17               Reason for consult:  Discharge Planning, Facility Placement                Permission sought to share information with:  Family Supports Permission granted to share information::  Yes, Verbal Permission Granted  Name::     [facility]  Agency::  snf  Relationship::  sister  Contact Information:  [phone]  Housing/Transportation Living arrangements for the past 2 months:  [place] of Information:  Patient Patient Interpreter Needed:  None Criminal Activity/Legal Involvement Pertinent to Current Situation/Hospitalization:  No - Comment as needed Significant Relationships:  Adult Children, Other Family Members Lives with:  Self Do you feel safe going back to the place where you live?  No Need for family participation in patient care:  Yes (Comment)  Care giving concerns:  Patient had sister [name] and niece Mitzi Hansen at bedside. Patient gave CSW verbal permission to talk with family. Patient stated he lives alone. Family stated that patients home is not safe because "its in bad condition", Family stated they are in the process of selling his home  Social Worker assessment / plan:  CSW met patient at bedside with family present to offer support and discuss discharge plan. Family stated they would like patient to discharge to SNF because he is not safe to go home by himself. Patient stated he is agreeable as well. CSW went over the process of receiving an LOG. CSW stated that because patient has no form of insurance and family is unable to pay privately that patient would have to rely on an LOG. CSW explained that an LOG would have to be approved by Surveyor, quantity of Social work prior to patient being discharge to rehab. Family is aware that if patient discharges with an LOG that it will limit the places he  can go. CSW explained to family that an LOG is not guaranteed and that the family would have to come up with a back up plan for a safe discharge for patient.  Family also asked about  Patient going to an ALF after rehab. CSW explained to family that she can give them a list of facility but they would have to start that process. CSW explained that if patient would like to go to an ALF after rehab it would have to be paid privately. Family is also aware that they would need to apply for medicaid on behalf of patient.   Employment status:  Unemployed Forensic scientist:  Self Pay (Medicaid Pending) PT Recommendations:  [place] / Referral to community resources:  [place]  Patient/Family's Response to care:  Family supportive of patient and his needs. Family just wanting guidance in helping patient with discharge   Patient/Family's Understanding of and Emotional Response to Diagnosis, Current Treatment, and Prognosis: Patient/family appreciate CSW role in care. Patient agreeable to discharge to rehab  Emotional Assessment Appearance:  Appears older than stated age Attitude/Demeanor/Rapport:  Engaged Affect (typically observed):  Accepting, Pleasant Orientation:  Oriented to Self, Oriented to Place, Oriented to  Time, Oriented to Situation Alcohol / Substance use:  Alcohol Use Psych involvement (Current and /or in the community):  No (Comment)  Discharge Needs  Concerns to be addressed:  Substance Abuse Concerns, Homelessness, Care Coordination, Financial /  Insurance Concerns(pt has no insurance) Readmission within the last 30 days:  No Current discharge risk:  Substance Abuse, Inadequate Financial Supports Barriers to Discharge:  Active Substance Use   Wende Neighbors, LCSW 06/17/2017, 4:28 PM

## 2017-06-17 NOTE — Progress Notes (Addendum)
PROGRESS NOTE  Michael Mcfarland UKG:[id number] DOB: 1965-10-06 DOA: 06/15/2017 PCP: [name], MD  HPI/Recap of past 24 hours: Michael Mcfarland is a 52 y.o. male with medical history significant of alcoholic abuse, alcoholic liver cirrhosis, alcohol withdrawal seizure, thrombocytopenia, tobacco abuse, GERD, stroke, depression, migraine headache, panic attacks, who presents with generalized weakness and confusion.  06/16/17: Patient seen and examined at his bedside.  He is alert and oriented x2.  Had 2 bowel movements this morning on lactulose.  Ammonia level on presentation.  Will repeat an ammonia level in the morning. Report  moderate headache with sensitivity to light and noise.  Migraine cocktail given.  06/17/17: alert and oriented x 3. No new complaints. Ammonia level 54 from 44 on lactulose. GI will see today.   Assessment/Plan: Principal Problem:   Acute metabolic encephalopathy Active Problems:   Cirrhosis (HCC)   Thrombocytopenia (HCC)   Generalized weakness   Acute hepatic encephalopathy   Tobacco abuse   Hepatic encephalopathy (HCC)   Acute metabolic encephalopathy most likely secondary to Hyperammonemia Continue lactulose Ammonia level on admission 44 We will obtain an MRI brain to further assess Continue close monitoring  History of seizure secondary to alcohol withdrawal No alcohol intake for more than a month Normal seizure medication Seizure precaution  Alcoholic liver cirrhosis Transaminitis Acute hepatitis panel negative HIV screen nonreactive GI consulted Continue to monitor  Chronic thrombocytopenia No sign of overt bleeding Continue to to monitor   Code Status: full  Family Communication: Daughter in Sports coach at bedside.  Disposition Plan: Home when hemodynamically stable.   Consultants:  None.  Procedures: None. Antimicrobials:  None.  DVT prophylaxis: SCDs.  Subcu Lovenox.   Objective: Vitals:   06/16/17 2022 06/17/17 0429  06/17/17 0810 06/17/17 1814  BP: 101/76 100/64 139/86   Pulse: (!) 113 81 (!) 102   Resp: (!) 21 15    Temp: 98.6 F (37 C) 98.2 F (36.8 C) 98.7 F (37.1 C) 98.1 F (36.7 C)  TempSrc: Oral Oral Oral Oral  SpO2: 99% 92%    Weight:      Height:        Intake/Output Summary (Last 24 hours) at 06/17/2017 1902 Last data filed at 06/17/2017 1839 Gross per 24 hour  Intake 480 ml  Output -  Net 480 ml   Filed Weights   06/15/17 2258  Weight: 97.6 kg (215 lb 1.6 oz)    Exam:06/17/17 seen and examined.   General: 52 yo CM A&O x3 slow at responding to questions.no acute distress.  Cardiovascular:Regular rate and rhythm with no murmurs rubs or gallops  Respiratory: Clear to auscultation with no wheezes or rales  Abdomen: soft NT ND NBS x4  Musculoskeletal: Bilateral lower extremity weakness. Asterixis noted.  Skin: No rash noted  Psychiatry: Mood is appropriate for condition and setting.   Data Reviewed: CBC: Recent Labs  Lab 06/15/17 1309 06/16/17 0037 06/17/17 0238  WBC 6.7 6.9 7.0  HGB 15.1 14.1 12.5*  HCT 43.1 40.6 36.5*  MCV 97.5 96.9 97.6  PLT 109* 108* 95*   Basic Metabolic Panel: Recent Labs  Lab 06/15/17 1446 06/16/17 0037 06/17/17 0238  NA 137 138 138  K 4.3 4.1 3.9  CL 105 106 109  CO2 24 22 21*  GLUCOSE 121* 82 112*  BUN 7 9 10   CREATININE 1.01 1.07 1.02  CALCIUM 9.3 9.2 8.7*   GFR: Estimated Creatinine Clearance: 101.4 mL/min (by C-G formula based on SCr of 1.02 mg/dL).  Liver Function Tests: Recent Labs  Lab 06/15/17 1655 06/16/17 0037 06/17/17 0238  AST 60* 53* 45*  ALT 26 25 21   ALKPHOS 192* 179* 165*  BILITOT 2.2* 2.9* 1.7*  PROT 7.2 7.2 6.1*  ALBUMIN 3.1* 3.1* 2.7*   Recent Labs  Lab 06/15/17 1655  LIPASE 27   Recent Labs  Lab 06/15/17 1655 06/17/17 0238 06/17/17 1136  AMMONIA 44* 54* 29   Coagulation Profile: Recent Labs  Lab 06/15/17 2323  INR 1.44   Cardiac Enzymes: No results for input(s): CKTOTAL, CKMB,  CKMBINDEX, TROPONINI in the last 168 hours. BNP (last 3 results) No results for input(s): PROBNP in the last 8760 hours. HbA1C: No results for input(s): HGBA1C in the last 72 hours. CBG: Recent Labs  Lab 06/16/17 0520 06/17/17 0650  GLUCAP 110* 112*   Lipid Profile: No results for input(s): CHOL, HDL, LDLCALC, TRIG, CHOLHDL, LDLDIRECT in the last 72 hours. Thyroid Function Tests: No results for input(s): TSH, T4TOTAL, FREET4, T3FREE, THYROIDAB in the last 72 hours. Anemia Panel: Recent Labs    06/15/17 2323  FOLATE 9.2   Urine analysis:    Component Value Date/Time   COLORURINE YELLOW 06/15/2017 [place] 06/15/2017 1705   LABSPEC 1.006 06/15/2017 1705   PHURINE 5.0 06/15/2017 1705   GLUCOSEU NEGATIVE 06/15/2017 1705   HGBUR NEGATIVE 06/15/2017 1705   BILIRUBINUR NEGATIVE 06/15/2017 [place] 06/15/2017 1705   PROTEINUR NEGATIVE 06/15/2017 1705   NITRITE NEGATIVE 06/15/2017 1705   LEUKOCYTESUR NEGATIVE 06/15/2017 1705   Sepsis Labs: @LABRCNTIP (procalcitonin:4,lacticidven:4)  )No results found for this or any previous visit (from the past 240 hour(s)).    Studies: Mr Jeri Cos Wo Contrast  Result Date: 06/16/2017 CLINICAL DATA:  Altered mental status EXAM: MRI HEAD WITHOUT AND WITH CONTRAST TECHNIQUE: Multiplanar, multiecho pulse sequences of the brain and surrounding structures were obtained without and with intravenous contrast. CONTRAST:  15mL MULTIHANCE GADOBENATE DIMEGLUMINE 529 MG/ML IV SOLN COMPARISON:  Head CT 06/15/2017 Brain MRI 05/21/2017 FINDINGS: Brain: The midline structures are normal. There is no acute infarct or acute hemorrhage. No mass lesion, hydrocephalus, dural abnormality or extra-axial collection. Mild periventricular white matter hyperintensity. Mild atrophy. No chronic microhemorrhage or superficial siderosis. Vascular: Major intracranial arterial and venous sinus flow voids are preserved. Skull and upper cervical  spine: The visualized skull base, calvarium, upper cervical spine and extracranial soft tissues are normal. Sinuses/Orbits: No fluid levels or advanced mucosal thickening. No mastoid or middle ear effusion. Normal orbits. IMPRESSION: Mild atrophy without acute intracranial abnormality. Electronically Signed   By: Ulyses Jarred M.D.   On: 06/16/2017 22:22   US Abdomen Complete  Result Date: 06/17/2017 CLINICAL DATA:  Cirrhosis EXAM: ABDOMEN ULTRASOUND COMPLETE COMPARISON:  01/18/2017 FINDINGS: Gallbladder: Gallbladder is well distended. Multiple calculi are seen. No gallbladder wall thickening or pericholecystic fluid is noted. Common bile duct: Diameter: 4 mm. Liver: Mild nodularity of the liver is noted consistent with the given clinical history of cirrhosis. No underlying mass is seen. Portal vein is patent on color Doppler imaging with normal direction of blood flow towards the liver. IVC: No abnormality visualized. Pancreas: Visualized portion unremarkable. Spleen: Size and appearance within normal limits. Right Kidney: Length: 12.1 cm. Echogenicity within normal limits. No mass or hydronephrosis visualized. Left Kidney: Length: 11.5 cm. Echogenicity within normal limits. No mass or hydronephrosis visualized. Abdominal aorta: Not well visualized due to overlying bowel gas. Other findings: None. IMPRESSION: Cholelithiasis without complicating factors. Changes of cirrhosis. Electronically Signed  By: Inez Catalina M.D.   On: 06/17/2017 12:36    Scheduled Meds: . [START ON 06/18/2017] enoxaparin (LOVENOX) injection  40 mg Subcutaneous Q24H  . feeding supplement (ENSURE ENLIVE)  237 mL Oral BID BM  . folic acid  1 mg Oral Daily  . lactulose  20 g Oral BID  . nicotine  21 mg Transdermal Daily  . thiamine  100 mg Oral Daily    Continuous Infusions: . sodium chloride 100 mL/hr at 06/17/17 1839     LOS: 2 days     Kayleen Memos, MD [facility] Pager [phone]  If 7PM-7AM, please  contact night-coverage [email or web address] Password [facility] 06/17/2017, 7:02 PM

## 2017-06-17 NOTE — Care Management Note (Signed)
Case Management Note Marvetta Gibbons RN, BSN Unit 4E-Case Manager [phone]   Patient Details  Name: Michael Mcfarland MRN: [id number] Date of Birth: [birth year]  Subjective/Objective:  Pt admitted with hepatic encephalopathy                  Action/Plan: PTA pt lived at home with sister- per PT eval recommendation for SNF- CSW to follow for possible SNF placement  Expected Discharge Date:                  Expected Discharge Plan:  Skilled Nursing Facility  In-House Referral:  Clinical Social Work  Discharge planning Services  CM Consult  Post Acute Care Choice:    Choice offered to:     DME Arranged:    DME Agency:     HH Arranged:    [place] Agency:     Status of Service:  In process, will continue to follow  If discussed at Long Length of Stay Meetings, dates discussed:    Discharge Disposition:   Additional Comments:  Dawayne Patricia, RN 06/17/2017, 10:29 AM

## 2017-06-18 ENCOUNTER — Encounter (HOSPITAL_COMMUNITY)
Admission: EM | Disposition: A | Discharge status: Home Health | Payer: Self-pay | Source: Home / Self Care | Attending: Internal Medicine

## 2017-06-18 ENCOUNTER — Encounter: Payer: Self-pay | Admitting: Gastroenterology

## 2017-06-18 ENCOUNTER — Inpatient Hospital Stay (HOSPITAL_COMMUNITY): Payer: Self-pay | Admitting: Certified Registered"

## 2017-06-18 ENCOUNTER — Encounter (HOSPITAL_COMMUNITY): Payer: Self-pay | Admitting: *Deleted

## 2017-06-18 DIAGNOSIS — K7460 Unspecified cirrhosis of liver: Secondary | ICD-10-CM

## 2017-06-18 DIAGNOSIS — K766 Portal hypertension: Secondary | ICD-10-CM

## 2017-06-18 DIAGNOSIS — I8510 Secondary esophageal varices without bleeding: Secondary | ICD-10-CM

## 2017-06-18 DIAGNOSIS — K7030 Alcoholic cirrhosis of liver without ascites: Secondary | ICD-10-CM

## 2017-06-18 DIAGNOSIS — K3189 Other diseases of stomach and duodenum: Secondary | ICD-10-CM

## 2017-06-18 DIAGNOSIS — I864 Gastric varices: Secondary | ICD-10-CM

## 2017-06-18 HISTORY — PX: ESOPHAGOGASTRODUODENOSCOPY: SHX5428

## 2017-06-18 LAB — GLUCOSE, CAPILLARY: Glucose-Capillary: 103 mg/dL — ABNORMAL HIGH (ref 65–99)

## 2017-06-18 SURGERY — EGD (ESOPHAGOGASTRODUODENOSCOPY)
Anesthesia: Monitor Anesthesia Care

## 2017-06-18 MED ORDER — NICOTINE 21 MG/24HR TD PT24: 21.0000 mg | MEDICATED_PATCH | Freq: Every day | TRANSDERMAL | 0 refills | Status: DC

## 2017-06-18 MED ORDER — PROPOFOL 10 MG/ML IV BOLUS
INTRAVENOUS | Status: DC | PRN
  Administered 2017-06-18: 30 mg via INTRAVENOUS
  Administered 2017-06-18: 40 mg via INTRAVENOUS
  Administered 2017-06-18: 30 mg via INTRAVENOUS

## 2017-06-18 MED ORDER — LACTATED RINGERS IV SOLN
INTRAVENOUS | Status: DC | PRN
  Administered 2017-06-18: 09:00:00 via INTRAVENOUS

## 2017-06-18 MED ORDER — ENSURE ENLIVE PO LIQD: 237.0000 mL | Freq: Two times a day (BID) | ORAL | 0 refills | Status: AC

## 2017-06-18 MED ORDER — PROPOFOL 500 MG/50ML IV EMUL
INTRAVENOUS | Status: DC | PRN
  Administered 2017-06-18: 100 ug/kg/min via INTRAVENOUS

## 2017-06-18 MED ORDER — LIDOCAINE 2% (20 MG/ML) 5 ML SYRINGE
INTRAMUSCULAR | Status: DC | PRN
  Administered 2017-06-18: 60 mg via INTRAVENOUS

## 2017-06-18 MED ORDER — BUTAMBEN-TETRACAINE-BENZOCAINE 2-2-14 % EX AERO
INHALATION_SPRAY | CUTANEOUS | Status: DC | PRN
  Administered 2017-06-18: 1 via TOPICAL

## 2017-06-18 MED ORDER — LACTULOSE 10 GM/15ML PO SOLN: 20.0000 g | Freq: Two times a day (BID) | ORAL | 0 refills | Status: DC

## 2017-06-18 NOTE — Anesthesia Postprocedure Evaluation (Signed)
Anesthesia Post Note  Patient: Michael Mcfarland  Procedure(s) Performed: ESOPHAGOGASTRODUODENOSCOPY (EGD) (N/A )     Patient location during evaluation: Endoscopy Anesthesia Type: MAC Level of consciousness: awake Pain management: pain level controlled Vital Signs Assessment: post-procedure vital signs reviewed and stable Respiratory status: spontaneous breathing Cardiovascular status: stable Postop Assessment: no apparent nausea or vomiting Anesthetic complications: no    Last Vitals:  Vitals:   06/18/17 0924 06/18/17 0935  BP: 108/64 (!) 141/78  Pulse: 86 87  Resp: 19 20  Temp: 36.8 C   SpO2: 99% 98%    Last Pain:  Vitals:   06/18/17 0935  TempSrc:   PainSc: 0-No pain   Pain Goal: Patients Stated Pain Goal: 3 (06/17/17 1756)               Mikeila Burgen JR,JOHN [place]

## 2017-06-18 NOTE — Anesthesia Procedure Notes (Signed)
Procedure Name: MAC Date/Time: 06/18/2017 9:08 AM Performed by: Barrington Ellison, CRNA Pre-anesthesia Checklist: Patient identified, Emergency Drugs available, Suction available, Patient being monitored and Timeout performed Patient Re-evaluated:Patient Re-evaluated prior to induction Oxygen Delivery Method: Nasal cannula

## 2017-06-18 NOTE — Clinical Social Work Note (Signed)
CSW met with patient. Daughter-in-law, Earnest Bailey [phone]) at bedside. CSW informed them that LOG had been denied. Patient's check that he gets from his wife's death is over limit that he would qualify for Medicaid. Patient and his family agreeable to private pay for short-term SNF. Patient would not sign over check but family would pay using other funds. [facility] is first preference as his son and daughter-in-law live in that area. Patient's son is meeting with lawyer today to become POA. PASARR under manual review. Patient cannot go to SNF until PASARR obtained.  Dayton Scrape, [place]

## 2017-06-18 NOTE — Discharge Summary (Addendum)
Discharge Summary  Michael Mcfarland:[id number] DOB: 1965-04-29  PCP: [name], MD  Admit date: 06/15/2017 Discharge date: 06/18/2017  Time spent: 25 minutes  Discharge delayed due to placement. Will continue to treat and monitor.  Recommendations for Outpatient Follow-up:  1. Follow-up with GI Dr. Braulio Conte in 6 weeks 2. Follow-up with PCP 3. Take medications as prescribed 4. Fall precaution 5. Continue PT  Discharge Diagnoses:  Active Hospital Problems   Diagnosis Date Noted  . Acute metabolic encephalopathy 02/54/2706  . Esophageal varices in alcoholic cirrhosis ([place])   . 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 Generalized weakness 06/15/2017  . Acute hepatic encephalopathy 06/15/2017  . Tobacco abuse 06/15/2017  . Hepatic encephalopathy ([place]) 06/15/2017  . Cirrhosis ([place])   . Thrombocytopenia [facility])     Resolved Hospital Problems  No resolved problems to display.    Discharge Condition: Stable  Diet recommendation: Resume previous diet  Vitals:   06/18/17 0935 06/18/17 1205  BP: (!) 141/78 136/73  Pulse: 87 82  Resp: 20 20  Temp:  97.7 F (36.5 C)  SpO2: 98% 92%    History of present illness:  Michael Curtis Wallaceis a 52 y.o.malewith medical history significant ofalcoholic abuse, alcoholic liver cirrhosis, alcohol withdrawal seizure, thrombocytopenia, tobacco abuse, GERD, stroke, depression, migraine headache, panic attacks, who presents with generalized weakness and confusion.  06/17/17: Patient seen and examined at his bedside.  He is alert and oriented x2.  Had 2 bowel movements this morning on lactulose.  Ammonia level on presentation.  Will repeat an ammonia level in the morning. Report  moderate headache with sensitivity to light and noise.  Migraine cocktail given.  06/18/2017: Patient seen and examined with his daughter-in-law at bedside.  Had EGD done this morning.  Which revealed varices with no bleeding.  As reported by GI.  GI signed off and  recommended follow up as outpatient in 6 weeks.  Patient has no new complaints.  On the day of discharge, patient was hemodynamically stable.  He would need to continue physical therapy.  Follow-up with GI and PCP posthospitalization.  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 Follow-up with GI outpatient Dr. Loletha Carrow in 6 weeks.  Chronic thrombocytopenia Stable No sign of overt bleeding   Procedures:  EGD on 06/18/2017  Consultations:  GI  Discharge Exam: BP 136/73 (BP Location: Left Arm)   Pulse 82   Temp 97.7 F (36.5 C) (Oral)   Resp 20   Ht 6' (1.829 m)   Wt 98.3 kg (216 lb 11.2 oz)   SpO2 92%   BMI 29.39 kg/m   General: 52 year old Caucasian male well-developed well-nourished in no acute distress.  Alert and oriented x3. Cardiovascular: Regular rate and rhythm with no rubs or gallops. Respiratory: Clear to auscultation with no wheezes or rales.  Discharge  Instructions You were cared for by a hospitalist during your hospital stay. If you have any questions about your discharge medications or the care you received while you were in the hospital after you are discharged, you can call the unit and asked to speak with the  hospitalist on call if the hospitalist that took care of you is not available. Once you are discharged, your primary care physician will handle any further medical issues. Please note that NO REFILLS for any discharge medications will be authorized once you are discharged, as it is imperative that you return to your primary care physician (or establish a relationship with a primary care physician if you do not have one) for your aftercare needs so that they can reassess your need for medications and monitor your lab values.   Allergies as of 06/18/2017   No Known Allergies     Medication List    TAKE these medications   feeding supplement (ENSURE ENLIVE) Liqd Take 237 mLs by mouth 2 (two) times daily between meals.   folic acid 1 MG tablet Commonly known as:  FOLVITE Take 1 tablet (1 mg total) by mouth daily.   hydrOXYzine 50 MG tablet Commonly known as:  ATARAX/VISTARIL Take 1 tablet (50 mg total) by mouth 3 (three) times daily as needed for anxiety (insomina).   lactulose 10 GM/15ML solution Commonly known as:  CHRONULAC Take 30 mLs (20 g total) by mouth 2 (two) times daily.   nicotine 21 mg/24hr patch Commonly known as:  NICODERM CQ - dosed in mg/24 hours Place 1 patch (21 mg total) onto the skin daily.   thiamine 100 MG tablet Take 1 tablet (100 mg total) by mouth daily.      No Known Allergies Follow-up Information    Doran Stabler, MD Follow up on 08/04/2017.   Specialty:  Gastroenterology Why:  10:30 PM follow up with liver MD.  this replaces 5/1 appointment with Dr [name].   Contact information: [address] [place] [address] [phone]        [name], MD Follow up in 1 week(s).   Specialty:  Family Medicine Contact information: [address] [place] [address] [phone]            The results of significant diagnostics from this hospitalization (including imaging, microbiology, ancillary and laboratory) are listed  below for reference.    Significant Diagnostic Studies: Dg Chest 2 View  Result Date: 06/15/2017 CLINICAL DATA:  Recent hospitalization for seizure activity. The patient has been coming progressively weaker and is unable to function independently. The patient has lost control of his bowels. EXAM: CHEST - 2 VIEW COMPARISON:  PA and lateral chest x-ray of May 20, 2017 FINDINGS: The lungs are well-expanded. The interstitial markings are coarse though stable. The heart and pulmonary vascularity are normal. There is calcification in the wall of the aortic arch. There is no pleural effusion. There is chronic partial compression of the body of T12. There is mild multilevel degenerative disc disease of the thoracic spine. IMPRESSION: There is no acute pneumonia nor pulmonary edema. There are stable chronic bronchitic-smoking related changes. Electronically Signed   By: David  Martinique M.D.   On: 06/15/2017 16:38   Dg Chest 2 View  Result Date: 05/20/2017 CLINICAL DATA:  Recent seizure activity EXAM: CHEST  2 VIEW COMPARISON:  01/17/2017 FINDINGS: Cardiac shadow is enlarged but stable. The lungs are well aerated bilaterally. No focal infiltrate or sizable effusion  is seen. Lower thoracic compression deformity of uncertain age is noted. IMPRESSION: No acute abnormality noted. Likely chronic thoracic compression of in the lower thoracic spine. Electronically Signed   By: Inez Catalina M.D.   On: 05/20/2017 16:19   Ct Head Wo Contrast  Result Date: 06/15/2017 CLINICAL DATA:  Altered level of consciousness, seizures, history of ethanol abuse, emphysema, GERD, stroke, smoker EXAM: CT HEAD WITHOUT CONTRAST TECHNIQUE: Contiguous axial images were obtained from the base of the skull through the vertex without intravenous contrast. COMPARISON:  05/20/2017 FINDINGS: Brain: Normal ventricular morphology. No midline shift or mass effect. Normal appearance of brain parenchyma. No intracranial hemorrhage, mass lesion,  evidence of acute infarction, or extra-axial fluid collection. Vascular: No hyperdense vessels Skull: Intact Sinuses/Orbits: Clear Other: N/A IMPRESSION: Normal exam. Electronically Signed   By: Lavonia Dana M.D.   On: 06/15/2017 18:47   Ct Head Wo Contrast  Result Date: 05/20/2017 CLINICAL DATA:  Seizure and fall. EXAM: CT HEAD WITHOUT CONTRAST CT CERVICAL SPINE WITHOUT CONTRAST TECHNIQUE: Multidetector CT imaging of the head and cervical spine was performed following the standard protocol without intravenous contrast. Multiplanar CT image reconstructions of the cervical spine were also generated. COMPARISON:  CT scan of January 17, 2017. FINDINGS: CT HEAD FINDINGS Brain: No evidence of acute infarction, hemorrhage, hydrocephalus, extra-axial collection or mass lesion/mass effect. Vascular: No hyperdense vessel or unexpected calcification. Skull: Normal. Negative for fracture or focal lesion. Sinuses/Orbits: No acute finding. Other: None. CT CERVICAL SPINE FINDINGS Alignment: Normal. Skull base and vertebrae: No acute fracture. No primary bone lesion or focal pathologic process. Soft tissues and spinal canal: No prevertebral fluid or swelling. No visible canal hematoma. Disc levels: Status post surgical anterior fusion of C5-6 and C6-7. Mild degenerative joint disease is seen involving the posterior facet joints bilaterally. Upper chest: Negative. Other: None. IMPRESSION: Normal head CT. Status post surgical anterior fusion of C5-6 and C6-7. No acute abnormality seen in the cervical spine. Electronically Signed   By: Marijo Conception, M.D.   On: 05/20/2017 16:45   Ct Cervical Spine Wo Contrast  Result Date: 05/20/2017 CLINICAL DATA:  Seizure and fall. EXAM: CT HEAD WITHOUT CONTRAST CT CERVICAL SPINE WITHOUT CONTRAST TECHNIQUE: Multidetector CT imaging of the head and cervical spine was performed following the standard protocol without intravenous contrast. Multiplanar CT image reconstructions of the cervical  spine were also generated. COMPARISON:  CT scan of January 17, 2017. FINDINGS: CT HEAD FINDINGS Brain: No evidence of acute infarction, hemorrhage, hydrocephalus, extra-axial collection or mass lesion/mass effect. Vascular: No hyperdense vessel or unexpected calcification. Skull: Normal. Negative for fracture or focal lesion. Sinuses/Orbits: No acute finding. Other: None. CT CERVICAL SPINE FINDINGS Alignment: Normal. Skull base and vertebrae: No acute fracture. No primary bone lesion or focal pathologic process. Soft tissues and spinal canal: No prevertebral fluid or swelling. No visible canal hematoma. Disc levels: Status post surgical anterior fusion of C5-6 and C6-7. Mild degenerative joint disease is seen involving the posterior facet joints bilaterally. Upper chest: Negative. Other: None. IMPRESSION: Normal head CT. Status post surgical anterior fusion of C5-6 and C6-7. No acute abnormality seen in the cervical spine. Electronically Signed   By: Marijo Conception, M.D.   On: 05/20/2017 16:45   Mr Brain Wo Contrast  Result Date: 05/21/2017 CLINICAL DATA:  Seizure.  Alcohol abuse. EXAM: MRI HEAD WITHOUT CONTRAST TECHNIQUE: Multiplanar, multiecho pulse sequences of the brain and surrounding structures were obtained without intravenous contrast. COMPARISON:  Head CT 05/20/2017 and MRI  02/15/2007 FINDINGS: Multiple sequences are mildly to moderately motion degraded. Brain: There is no evidence of acute infarct, intracranial hemorrhage, mass, midline shift, or extra-axial fluid collection. Mild cerebral atrophy has progressed from 2008. No significant cerebral white matter disease is seen for age. Dedicated temporal lobe imaging is motion degraded which precludes detailed assessment of the hippocampi. Vascular: Major intracranial vascular flow voids are preserved. Skull and upper cervical spine: Unremarkable bone marrow signal. Sinuses/Orbits: Unremarkable orbits. Minimal scattered paranasal sinus mucosal  thickening. Clear mastoid air cells. Other: None. IMPRESSION: 1. Motion degraded examination without evidence of acute intracranial abnormality. 2. Mild cerebral atrophy. Electronically Signed   By: Logan Bores M.D.   On: 05/21/2017 09:07   Mr Jeri Cos ZH Contrast  Result Date: 06/16/2017 CLINICAL DATA:  Altered mental status EXAM: MRI HEAD WITHOUT AND WITH CONTRAST TECHNIQUE: Multiplanar, multiecho pulse sequences of the brain and surrounding structures were obtained without and with intravenous contrast. CONTRAST:  37mL MULTIHANCE GADOBENATE DIMEGLUMINE 529 MG/ML IV SOLN COMPARISON:  Head CT 06/15/2017 Brain MRI 05/21/2017 FINDINGS: Brain: The midline structures are normal. There is no acute infarct or acute hemorrhage. No mass lesion, hydrocephalus, dural abnormality or extra-axial collection. Mild periventricular white matter hyperintensity. Mild atrophy. No chronic microhemorrhage or superficial siderosis. Vascular: Major intracranial arterial and venous sinus flow voids are preserved. Skull and upper cervical spine: The visualized skull base, calvarium, upper cervical spine and extracranial soft tissues are normal. Sinuses/Orbits: No fluid levels or advanced mucosal thickening. No mastoid or middle ear effusion. Normal orbits. IMPRESSION: Mild atrophy without acute intracranial abnormality. Electronically Signed   By: Ulyses Jarred M.D.   On: 06/16/2017 22:22   US Abdomen Complete  Result Date: 06/17/2017 CLINICAL DATA:  Cirrhosis EXAM: ABDOMEN ULTRASOUND COMPLETE COMPARISON:  01/18/2017 FINDINGS: Gallbladder: Gallbladder is well distended. Multiple calculi are seen. No gallbladder wall thickening or pericholecystic fluid is noted. Common bile duct: Diameter: 4 mm. Liver: Mild nodularity of the liver is noted consistent with the given clinical history of cirrhosis. No underlying mass is seen. Portal vein is patent on color Doppler imaging with normal direction of blood flow towards the liver. IVC: No  abnormality visualized. Pancreas: Visualized portion unremarkable. Spleen: Size and appearance within normal limits. Right Kidney: Length: 12.1 cm. Echogenicity within normal limits. No mass or hydronephrosis visualized. Left Kidney: Length: 11.5 cm. Echogenicity within normal limits. No mass or hydronephrosis visualized. Abdominal aorta: Not well visualized due to overlying bowel gas. Other findings: None. IMPRESSION: Cholelithiasis without complicating factors. Changes of cirrhosis. Electronically Signed   By: Inez Catalina M.D.   On: 06/17/2017 12:36    Microbiology: No results found for this or any previous visit (from the past 240 hour(s)).   Labs: Basic Metabolic Panel: Recent Labs  Lab 06/15/17 1446 06/16/17 0037 06/17/17 0238  NA 137 138 138  K 4.3 4.1 3.9  CL 105 106 109  CO2 24 22 21*  GLUCOSE 121* 82 112*  BUN 7 9 10   CREATININE 1.01 1.07 1.02  CALCIUM 9.3 9.2 8.7*   Liver Function Tests: Recent Labs  Lab 06/15/17 1655 06/16/17 0037 06/17/17 0238  AST 60* 53* 45*  ALT 26 25 21   ALKPHOS 192* 179* 165*  BILITOT 2.2* 2.9* 1.7*  PROT 7.2 7.2 6.1*  ALBUMIN 3.1* 3.1* 2.7*   Recent Labs  Lab 06/15/17 1655  LIPASE 27   Recent Labs  Lab 06/15/17 1655 06/17/17 0238 06/17/17 1136  AMMONIA 44* 54* 29   CBC: Recent Labs  Lab  06/15/17 1309 06/16/17 0037 06/17/17 0238  WBC 6.7 6.9 7.0  HGB 15.1 14.1 12.5*  HCT 43.1 40.6 36.5*  MCV 97.5 96.9 97.6  PLT 109* 108* 95*   Cardiac Enzymes: No results for input(s): CKTOTAL, CKMB, CKMBINDEX, TROPONINI in the last 168 hours. BNP: BNP (last 3 results) No results for input(s): BNP in the last 8760 hours.  ProBNP (last 3 results) No results for input(s): PROBNP in the last 8760 hours.  CBG: Recent Labs  Lab 06/16/17 0520 06/17/17 0650 06/18/17 0520  GLUCAP 110* 112* 103*       Signed:  Kayleen Memos, MD [facility] 06/18/2017, 2:15 PM

## 2017-06-18 NOTE — Progress Notes (Signed)
Physical Therapy Treatment Patient Details Name: CLOUD GRAHAM MRN: [id number] DOB: [birth year] Today's Date: 06/18/2017    History of Present Illness Pt is a 52 y.o. male with PMH significant of alcoholic abuse, alcoholic liver cirrhosis, alcohol withdrawal seizure, thrombocytopenia, tobacco abuse, GERD, stroke, depression, migraine headache, panic attacks, who presents 06/15/17 with generalized weakness and confusion. Worked up for acute metabolic encephalopathy, possibly due to hepatic encephalopathy given h/o alcoholic liver cirrhosis and elevated ammonia level. CT-head negative for acute intracranial abnormalities.    PT Comments    Pt performed gait training and functional mobility.  Pt tolerated session well but appears agitated with increased mobility.  Improved balance observed with use of RW.  Pt continues to require short term rehab at SNF before returning home as he is home alone and balance deficits prove to be a fall risk at this time.  Plan next session for higher level balance activities.     Follow Up Recommendations  SNF;Supervision/Assistance - 24 hour     Equipment Recommendations  (TBD at next venue)    Recommendations for Other Services       Precautions / Restrictions Precautions Precautions: Fall Restrictions Weight Bearing Restrictions: No    Mobility  Bed Mobility Overal bed mobility: Needs Assistance Bed Mobility: Supine to Sit     Supine to sit: Supervision;HOB elevated Sit to supine: Supervision   General bed mobility comments: Supervision use of bed railing and increased time and effort.    Transfers Overall transfer level: Needs assistance Equipment used: Rolling walker (2 wheeled) Transfers: Sit to/from Stand Sit to Stand: Min guard         General transfer comment: Pt required assistance for steadying and his demonstrated LOB and reaching for bed railing and counters to off set LOB.  Pt performed toilet transfer with use of R grab bar.     Ambulation/Gait Ambulation/Gait assistance: Min guard Ambulation Distance (Feet): 120 Feet Assistive device: Rolling walker (2 wheeled) Gait Pattern/deviations: Step-through pattern;Shuffle;Trunk flexed;Drifts right/left Gait velocity: Decreased Gait velocity interpretation: Below normal speed for age/gender General Gait Details: Pt required cues for obstacle negotiation and for safety with RW use.  Pt with improved balance with use of RW.   Stairs            Wheelchair Mobility    Modified Rankin (Stroke Patients Only)       Balance Overall balance assessment: Needs assistance   Sitting balance-Leahy Scale: Good       Standing balance-Leahy Scale: Poor                              Cognition Arousal/Alertness: Awake/alert Behavior During Therapy: Flat affect Overall Cognitive Status: Impaired/Different from baseline Area of Impairment: Safety/judgement;Following commands;Problem solving                       Following Commands: Follows one step commands with increased time Safety/Judgement: Decreased awareness of safety;Decreased awareness of deficits   Problem Solving: Slow processing;Requires verbal cues General Comments: Pt does not appear receptive to PT durind afternoon.        Exercises      General Comments        Pertinent Vitals/Pain Pain Assessment: Faces Faces Pain Scale: Hurts a little bit Pain Location: neck Pain Descriptors / Indicators: Discomfort Pain Intervention(s): Monitored during session;Repositioned    Home Living  Prior Function            PT Goals (current goals can now be found in the care plan section) Acute Rehab PT Goals Patient Stated Goal: none stated Time For Goal Achievement: 06/30/17 Progress towards PT goals: Progressing toward goals    Frequency    Min 2X/week      PT Plan Current plan remains appropriate    Co-evaluation               AM-PAC PT "6 Clicks" Daily Activity  Outcome Measure  Difficulty turning over in bed (including adjusting bedclothes, sheets and blankets)?: None Difficulty moving from lying on back to sitting on the side of the bed? : A Lot Difficulty sitting down on and standing up from a chair with arms (e.g., wheelchair, bedside commode, etc,.)?: Unable Help needed moving to and from a bed to chair (including a wheelchair)?: A Little Help needed walking in hospital room?: A Little Help needed climbing 3-5 steps with a railing? : A Little 6 Click Score: 16    End of Session Equipment Utilized During Treatment: Gait belt Activity Tolerance: Patient tolerated treatment well Patient left: in bed;with call bell/phone within reach;with family/visitor present;with bed alarm set Nurse Communication: Mobility status PT Visit Diagnosis: Other abnormalities of gait and mobility (R26.89);Repeated falls (R29.6)     Time: 7972-8206 PT Time Calculation (min) (ACUTE ONLY): 21 min  Charges:  $Gait Training: 8-22 mins                    G Codes:       Governor Rooks, PTA pager [phone]    Cristela Blue 06/18/2017, 1:40 PM

## 2017-06-18 NOTE — Anesthesia Preprocedure Evaluation (Addendum)
Anesthesia Evaluation  Patient identified by MRN, date of birth, ID band Patient awake    Reviewed: Allergy & Precautions, NPO status , Patient's Chart, lab work & pertinent test results  Airway Mallampati: III  TM Distance: >3 FB Neck ROM: Limited    Dental  (+) Teeth Intact, Dental Advisory Given   Pulmonary Current Smoker,    Pulmonary exam normal        Cardiovascular Normal cardiovascular exam Rhythm:Regular Rate:Normal     Neuro/Psych    GI/Hepatic Neg liver ROS,   Endo/Other  negative endocrine ROS  Renal/GU   negative genitourinary   Musculoskeletal   Abdominal (+) + obese,   Peds  Hematology   Anesthesia Other Findings   Reproductive/Obstetrics                            Anesthesia Physical Anesthesia Plan  ASA: II  Anesthesia Plan: MAC   Post-op Pain Management:    Induction:   PONV Risk Score and Plan: 0  Airway Management Planned: Natural Airway and Mask  Additional Equipment:   Intra-op Plan:   Post-operative Plan:   Informed Consent: I have reviewed the patients History and Physical, chart, labs and discussed the procedure including the risks, benefits and alternatives for the proposed anesthesia with the patient or authorized representative who has indicated his/her understanding and acceptance.   Dental advisory given  Plan Discussed with: CRNA  Anesthesia Plan Comments:         Anesthesia Quick Evaluation

## 2017-06-18 NOTE — NC FL2 (Signed)
[place] LEVEL OF CARE SCREENING TOOL     IDENTIFICATION  Patient Name: Michael Mcfarland Birthdate: April 02, 1965 Sex: male Admission Date (Current Location): 06/15/2017  [facility] and [place] Number:  Herbalist and Address:  [facility] [place], [place] [address], [place],  [address]      Provider Number: [id number]  Attending Physician Name and Address:  Kayleen Memos, DO  Relative Name and Phone Number:  [name], [phone]    Current Level of Care: Hospital Recommended Level of Care: [place] Prior Approval Number:    Date Approved/Denied:   PASRR Number: Manual review  Discharge Plan: SNF    Current Diagnoses: Patient Active Problem List   Diagnosis Date Noted  . Esophageal varices in alcoholic cirrhosis ([place])   . 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 Acute metabolic encephalopathy 42/35/3614  . Generalized weakness 06/15/2017  . Acute hepatic encephalopathy 06/15/2017  . Tobacco abuse 06/15/2017  . Hepatic encephalopathy ([place]) 06/15/2017  . Weakness 06/03/2017  . Gait abnormality 06/03/2017  . Anxiety 06/03/2017  . Diarrhea 06/03/2017  . Pressure injury of skin 05/22/2017  . AKI (acute kidney injury) ([place])   . Seizure ([place])   . Cirrhosis ([place])   . Syncope   . Thrombocytopenia ([place])   . Alcohol withdrawal seizure ([place]) 01/17/2017    Orientation RESPIRATION BLADDER Height & Weight     Self, Time, Situation, Place  Normal Continent Weight: 216 lb 11.2 oz (98.3 kg) Height:  6' (182.9 cm)  BEHAVIORAL SYMPTOMS/MOOD NEUROLOGICAL BOWEL NUTRITION STATUS  (None) Convulsions/Seizures(History) Continent Diet(2 gram sodium)  AMBULATORY STATUS COMMUNICATION OF NEEDS Skin   Limited Assist Verbally Normal                       Personal Care Assistance Level of Assistance  Bathing, Feeding, Dressing Bathing Assistance: Limited assistance Feeding assistance: Limited assistance Dressing  Assistance: Limited assistance     Functional Limitations Info  Sight, Hearing, Speech Sight Info: Adequate Hearing Info: Adequate Speech Info: Adequate    SPECIAL CARE FACTORS FREQUENCY  PT (By licensed PT), OT (By licensed OT)     PT Frequency: 5 x week OT Frequency: 5 x week            Contractures Contractures Info: Not present    Additional Factors Info  Code Status, Allergies, Psychotropic Code Status Info: Full Allergies Info: NKDA Psychotropic Info: Anxiety         Current Medications (06/18/2017):  This is the current hospital active medication list Current Facility-Administered Medications  Medication Dose Route Frequency Provider Last Rate Last Dose  . enoxaparin (LOVENOX) injection 40 mg  40 mg Subcutaneous Q24H Danis, Henry L III, MD      . feeding supplement (ENSURE ENLIVE) (ENSURE ENLIVE) liquid 237 mL  237 mL Oral BID BM Nelida Meuse III, MD   237 mL at 06/18/17 1117  . folic acid (FOLVITE) tablet 1 mg  1 mg Oral Daily Nelida Meuse III, MD   1 mg at 06/18/17 1116  . ibuprofen (ADVIL,MOTRIN) tablet 200 mg  200 mg Oral Q6H PRN Nelida Meuse III, MD   200 mg at 06/17/17 1003  . lactulose (CHRONULAC) 10 GM/15ML solution 20 g  20 g Oral BID Nelida Meuse III, MD   20 g at 06/18/17 1116  . LORazepam (ATIVAN) injection 1 mg  1 mg Intravenous Q2H PRN Nelida Meuse III,  MD      . nicotine (NICODERM CQ - dosed in mg/24 hours) patch 21 mg  21 mg Transdermal Daily Nelida Meuse III, MD   21 mg at 06/17/17 2107  . ondansetron (ZOFRAN) tablet 4 mg  4 mg Oral Q6H PRN Nelida Meuse III, MD       Or  . ondansetron (ZOFRAN) injection 4 mg  4 mg Intravenous Q6H PRN Nelida Meuse III, MD      . thiamine (VITAMIN B-1) tablet 100 mg  100 mg Oral Daily Nelida Meuse III, MD   100 mg at 06/18/17 1116  . zolpidem (AMBIEN) tablet 5 mg  5 mg Oral QHS PRN Doran Stabler, MD   5 mg at 06/17/17 2102     Discharge Medications: Please see discharge summary for a list  of discharge medications.  Relevant Imaging Results:  Relevant Lab Results:   Additional Information SS#: [id number]  Candie Chroman, LCSW

## 2017-06-18 NOTE — Op Note (Signed)
[facility] Patient Name: Michael Mcfarland Procedure Date : 06/18/2017 MRN: [id number] Attending MD: Estill Cotta. Loletha Carrow , MD Date of Birth: 06/06/1965 CSN: [id number] Age: 52 Admit Type: Inpatient Procedure:                Upper GI endoscopy Indications:              Cirrhosis rule out esophageal varices Providers:                Mallie Mussel L. Loletha Carrow, MD, Zenon Mayo, RN, Cherylynn Ridges, Technician, Charolette Child, Technician Referring MD:              Medicines:                Monitored Anesthesia Care Complications:            No immediate complications. Estimated Blood Loss:     Estimated blood loss: none. Procedure:                Pre-Anesthesia Assessment:                           - Prior to the procedure, a History and Physical                            was performed, and patient medications and                            allergies were reviewed. The patient's tolerance of                            previous anesthesia was also reviewed. The risks                            and benefits of the procedure and the sedation                            options and risks were discussed with the patient.                            All questions were answered, and informed consent                            was obtained. Prior Anticoagulants: The patient has                            taken Lovenox (enoxaparin), last dose was 1 day                            prior to procedure. ASA Grade Assessment: III - A                            patient with severe systemic disease. After  reviewing the risks and benefits, the patient was                            deemed in satisfactory condition to undergo the                            procedure.                           After obtaining informed consent, the endoscope was                            passed under direct vision. Throughout the                            procedure, the patient's  blood pressure, pulse, and                            oxygen saturations were monitored continuously. The                            [id number]-[id number] ([id number]) scope was introduced through the                            mouth, and advanced to the second part of duodenum.                            The upper GI endoscopy was accomplished without                            difficulty. The patient tolerated the procedure                            well. Scope In: Scope Out: Findings:      Grade II varices were found in the lower third of the esophagus.      The exam of the esophagus was otherwise normal.      Type 2 gastroesophageal varices (GOV2, esophageal varices which extend       along the fundus) with no bleeding were found in the cardia. There were       no stigmata of recent bleeding. Seen best on retroflexion.      Portal hypertensive gastropathy was found in the entire examined stomach.      The examined duodenum was normal. Impression:               - Grade II esophageal varices.                           - Type 2 gastroesophageal varices (GOV2, esophageal                            varices which extend along the fundus), without                            bleeding.                           -  Portal hypertensive gastropathy.                           - Normal examined duodenum.                           - No specimens collected. Moderate Sedation:      MAC sedation used Recommendation:           - Return patient to hospital ward for ongoing care.                           - Low sodium diet.                           - Continue present medications.                           - Begin non-selective beta blocker such as nadolol                            20 mg once daily and titrate up (in outpatient                            setting) as blood pressure tolerates to achieve                            resting pulse on the 60's.                           - Patient can be discharged home  from a GI                            perspective to follow up with our office in about 6                            weeks. Procedure Code(s):        --- Professional ---                           [phone], Esophagogastroduodenoscopy, flexible,                            transoral; diagnostic, including collection of                            specimen(s) by brushing or washing, when performed                            (separate procedure) Diagnosis Code(s):        --- Professional ---                           K74.60, Unspecified cirrhosis of liver                           I85.10, Secondary esophageal varices without  bleeding                           I86.4, Gastric varices                           K76.6, Portal hypertension                           K31.89, Other diseases of stomach and duodenum CPT copyright 2016 American Medical Association. All rights reserved. The codes documented in this report are preliminary and upon coder review may  be revised to meet current compliance requirements. Michael Mcfarland L. Loletha Carrow, MD 06/18/2017 9:25:56 AM This report has been signed electronically. Number of Addenda: 0

## 2017-06-18 NOTE — Interval H&P Note (Signed)
History and Physical Interval Note:  06/18/2017 8:45 AM  Michael Mcfarland  has presented today for surgery, with the diagnosis of cirrhosis.  screen for esophageal varices  The various methods of treatment have been discussed with the patient and family. After consideration of risks, benefits and other options for treatment, the patient has consented to  Procedure(s): ESOPHAGOGASTRODUODENOSCOPY (EGD) (N/A) as a surgical intervention .  The patient's history has been reviewed, patient examined, no change in status, stable for surgery.  I have reviewed the patient's chart and labs.  Questions were answered to the patient's satisfaction.     Nelida Meuse III

## 2017-06-18 NOTE — Transfer of Care (Signed)
Immediate Anesthesia Transfer of Care Note  Patient: Michael Mcfarland  Procedure(s) Performed: ESOPHAGOGASTRODUODENOSCOPY (EGD) (N/A )  Patient Location: Endoscopy Unit  Anesthesia Type:MAC  Level of Consciousness: awake, alert  and oriented  Airway & Oxygen Therapy: Patient Spontanous Breathing  Post-op Assessment: Report given to RN  Post vital signs: Reviewed and stable  Last Vitals:  Vitals Value Taken Time  BP    Temp    Pulse    Resp    SpO2      Last Pain:  Vitals:   06/18/17 0815  TempSrc: Oral  PainSc: 0-No pain      Patients Stated Pain Goal: 3 (58/59/29 2446)  Complications: No apparent anesthesia complications

## 2017-06-18 NOTE — Discharge Instructions (Signed)
Cirrhosis Cirrhosis is long-term (chronic) liver injury. The liver is your largest internal organ, and it performs many functions. The liver converts food into energy, removes toxic material from your blood, makes important proteins, and absorbs necessary vitamins from your diet. If you have cirrhosis, it means many of your healthy liver cells have been replaced by scar tissue. This prevents blood from flowing through your liver, which makes it difficult for your liver to function. This scarring is not reversible, but treatment can prevent it from getting worse. What are the causes? Hepatitis C and long-term alcohol abuse are the most common causes of cirrhosis. Other causes include:  Nonalcoholic fatty liver disease.  Hepatitis B infection.  Autoimmune hepatitis.  Diseases that cause blockage of ducts inside the liver.  Inherited liver diseases.  Reactions to certain long-term medicines.  Parasitic infections.  Long-term exposure to certain toxins.  What increases the risk? You may have a higher risk of cirrhosis if you:  Have certain hepatitis viruses.  Abuse alcohol, especially if you are male.  Are overweight.  Share needles.  Have unprotected sex with someone who has hepatitis.  What are the signs or symptoms? You may not have any signs and symptoms at first. Symptoms may not develop until the damage to your liver starts to get worse. Signs and symptoms of cirrhosis may include:  Tenderness in the right-upper part of your abdomen.  Weakness and tiredness (fatigue).  Loss of appetite.  Nausea.  Weight loss and muscle loss.  Itchiness.  Yellow skin and eyes (jaundice).  Buildup of fluid in the abdomen (ascites).  Swelling of the feet and ankles (edema).  Appearance of tiny blood vessels under the skin.  Mental confusion.  Easy bruising and bleeding.  How is this diagnosed? Your health care provider may suspect cirrhosis based on your symptoms and  medical history, especially if you have other medical conditions or a history of alcohol abuse. Your health care provider will do a physical exam to feel your liver and check for signs of cirrhosis. Your health care provider may perform other tests, including:  Blood tests to check: ? Whether you have hepatitis B or C. ? Kidney function. ? Liver function.  Imaging tests such as: ? MRI or CT scan to look for changes seen in advanced cirrhosis. ? Ultrasound to see if normal liver tissue is being replaced by scar tissue.  A procedure using a long needle to take a sample of liver tissue (biopsy) for examination under a microscope. Liver biopsy can confirm the diagnosis of cirrhosis.  How is this treated? Treatment depends on how damaged your liver is and what caused the damage. Treatment may include treating cirrhosis symptoms or treating the underlying causes of the condition to try to slow the progression of the damage. Treatment may include:  Making lifestyle changes, such as: ? Eating a healthy diet. ? Restricting salt intake. ? Maintaining a healthy weight. ? Not abusing drugs or alcohol.  Taking medicines to: ? Treat liver infections or other infections. ? Control itching. ? Reduce fluid buildup. ? Reduce certain blood toxins. ? Reduce risk of bleeding from enlarged blood vessels in the stomach or esophagus (varices).  If varices are causing bleeding problems, you may need treatment with a procedure that ties up the vessels causing them to fall off (band ligation).  If cirrhosis is causing your liver to fail, your health care provider may recommend a liver transplant.  Other treatments may be recommended depending on any complications  of cirrhosis, such as liver-related kidney failure (hepatorenal syndrome).  Follow these instructions at home:  Take medicines only as directed by your health care provider. Do not use drugs that are toxic to your liver. Ask your health care  provider before taking any new medicines, including over-the-counter medicines.  Rest as needed.  Eat a well-balanced diet. Ask your health care provider or dietitian for more information.  You may have to follow a low-salt diet or restrict your water intake as directed.  Do not drink alcohol. This is especially important if you are taking acetaminophen.  Keep all follow-up visits as directed by your health care provider. This is important. Contact a health care provider if:  You have fatigue or weakness that is getting worse.  You develop swelling of the hands, feet, legs, or face.  You have a fever.  You develop loss of appetite.  You have nausea or vomiting.  You develop jaundice.  You develop easy bruising or bleeding. Get help right away if:  You vomit bright red blood or a material that looks like coffee grounds.  You have blood in your stools.  Your stools appear black and tarry.  You become confused.  You have chest pain or trouble breathing. This information is not intended to replace advice given to you by your health care provider. Make sure you discuss any questions you have with your health care provider. Document Released: 03/16/2005 Document Revised: 07/25/2015 Document Reviewed: 11/22/2013 Elsevier Interactive Patient Education  2018 [place].   Toxic Metabolic Encephalopathy Toxic metabolic encephalopathy (TME) is a type of brain disorder caused by a change in brain chemistry. This condition may result from illnesses or conditions that cause an imbalance of fluid, minerals (electrolytes), and other substances in the body that affect the way the brain functions. It is not caused by brain damage or brain disease. TME can cause confusion and other mental disturbances, which are generally referred to as delirium. Untreated delirium may lead to permanent mental changes or worsening medical conditions. Untreated delirium is a life-threatening condition that  may need to be treated in the hospital. What are the causes? Possible causes of TME that can lead to deliriuminclude:  Short-term (acute) or long-term (chronic) disease of the kidney or liver.  Not having enough fluid in the body (dehydration).  Changes in the acid level (pH) of the blood.  High or low levels of any of the following substances in the blood: ? Calcium. ? Salt (sodium). ? Sugar (glucose). ? Magnesium. ? Phosphate.  High body temperature.  Not having enough oxygen in the blood.  Low levels of B vitamins. This can result from alcohol abuse.  Certain medicines, such as steroids and medicines that reduce the activity of the immune system (immunosuppressants).  Certain infections.  What increases the risk? You may have a higher risk for TME if you:  Are elderly.  Have dementia.  Are in the hospital, especially in intensive care.  Live in a nursing home.  Had recent surgery.  Have liver or kidney disease.  Have poorly controlled diabetes.  Have chronic medical problems, especially heart or lung disease.  Are not getting enough fluids.  Have poor nutrition.  Abuse alcohol.  What are the signs or symptoms? Symptoms of TME may include:  Muscle stiffness or jerking (spasticity).  Shaking (tremors).  Flapping of the hands.  Weakness.  Clumsiness.  Slowed breathing.  Jerky movements that you cannot control (seizures).  Not being able to stay awake (  drowsiness).  Not being able to pay attention.  Loss of consciousness (coma).  Symptoms of delirium caused by TME include:  Confusion.  Difficulty focusing or concentrating, or inability to focus or concentrate.  Not knowing where you are (disorientation).  Seeing or hearing things that are not real (hallucinations).  Fearfulness.  False beliefs (delusions).  Changes in mood or personality.  Changes in speech, such as saying things that do not make sense.  Memory  loss.  Irritability.  Avoiding other people (withdrawal).  Depression.  Poor judgment.  Changes in eating and sleeping patterns.  Hyperactivity.  Decreased alertness.  General mistrust of others (paranoia).  Delirium may come and go. Symptoms of delirium may start suddenly or gradually, and they often get worse at night. How is this diagnosed? This condition is diagnosed based on:  Your symptoms and behavior.  An exam to check how you are thinking, feeling, and behaving (mental status exam). To diagnose delirium, the mental status exam must rule out other possible causes of TME, and must show: ? Changes in attention and awareness. ? Changes that develop over a short period of time and tend to come and go (fluctuate). ? Changes in memory, language, and thinking that were not present before.  A physical exam.  Imaging tests, such as: ? MRI. ? CT scan.  Blood tests to: ? Measure liver and kidney function. ? Check for a lack (deficiency) of vitamin B. ? Check for changes in acid levels (pH) and changes in calcium, sodium, or magnesium levels in the blood. ? Measure your blood sugar (glucose). ? Measure your blood oxygen level.  How is this treated? Treatment for TME depends on the cause, and it may include.  Getting fluids through an IV tube.  Regulating calcium, sodium, glucose, or magnesium levels in the body.  Getting oxygen.  Improving nutrition.  Treating liver or kidney disease.  Adjusting certain medicines.  Treating infections.  If the cause is found and treated, delirium usually improves. Managing delirium may include:  Keeping the room well-lit and quiet.  Using calendars, pictures, and clocks to prevent disorientation.  Having frequent checks from nursing staff and visits from caregivers.  Wearing eyeglasses or a hearing aid, if needed.  Physical therapy.  Medicine to treat agitation, anxiety, hallucinations, or delusions.  Follow these  instructions at home:  Drink enough fluid to keep your urine clear or pale yellow.  Take over-the-counter and prescription medicines only as told by your health care provider.  Return to your normal activities as told by your health care provider. Ask your health care provider what activities are safe for you.  Follow a healthy diet. Do not skip meals.  Do not drink alcohol.  Go to bed at the same time every night.  Keep all follow-up visits as told by your health care provider. This is important. Contact a health care provider if:  You are unable to feed yourself or hydrate yourself.  You need help at home.  You start to feel clumsy.  You start to have tremors or weakness. Get help right away if:  You have a seizure.  You lose consciousness.  You have trouble breathing.  You do not feel able to care for yourself at home.  You have a fever.  You become disoriented at home. This information is not intended to replace advice given to you by your health care provider. Make sure you discuss any questions you have with your health care provider. Document Released: 08/23/2015  Document Revised: 11/18/2015 Document Reviewed: 08/23/2015 Elsevier Interactive Patient Education  [employer].

## 2017-06-19 LAB — GLUCOSE, CAPILLARY: Glucose-Capillary: 109 mg/dL — ABNORMAL HIGH (ref 65–99)

## 2017-06-19 LAB — AMMONIA: AMMONIA: 33 umol/L (ref 9–35)

## 2017-06-19 MED ORDER — LACTULOSE 10 GM/15ML PO SOLN
10.0000 g | Freq: Every day | ORAL | Status: DC
  Administered 2017-06-19 – 2017-06-20 (×2): 10 g via ORAL
  Filled 2017-06-19 (×2): qty 15

## 2017-06-19 MED ORDER — METOCLOPRAMIDE HCL 5 MG/ML IJ SOLN
5.0000 mg | Freq: Once | INTRAMUSCULAR | Status: AC
  Administered 2017-06-19: 5 mg via INTRAVENOUS
  Filled 2017-06-19: qty 2

## 2017-06-19 MED ORDER — KETOROLAC TROMETHAMINE 15 MG/ML IJ SOLN
7.5000 mg | Freq: Once | INTRAMUSCULAR | Status: AC
  Administered 2017-06-19: 7.5 mg via INTRAVENOUS
  Filled 2017-06-19: qty 1

## 2017-06-19 MED ORDER — DIPHENHYDRAMINE HCL 50 MG/ML IJ SOLN
12.5000 mg | Freq: Once | INTRAMUSCULAR | Status: AC
  Administered 2017-06-19: 12.5 mg via INTRAVENOUS
  Filled 2017-06-19: qty 1

## 2017-06-19 NOTE — Clinical Social Work Note (Addendum)
Patient's PASARR is still under manual review. Patient cannot go to SNF until PASARR obtained.  Dayton Scrape, CSW [phone]  1:42 pm [facility] has made a bed offer. They would require $10,000 up front and this does not include therapy and medications. CSW called and notified patient's daughter-in-law, Earnest Bailey. RN updated as well and asked to pass the above information along to the nursing staff that covers him until placement is found.  Dayton Scrape, [place]

## 2017-06-19 NOTE — Progress Notes (Signed)
Discharge Summary  Michael Mcfarland VEH:[id number] DOB: [birth year]  PCP: [name], MD  Admit date: 06/15/2017 Discharge date: 06/19/2017  Time spent: 25 minutes  Discharge delayed due to placement. Will continue to treat and monitor.  06/19/17: seen and examined. good stool output on lactulose. Ammonia 33.  Recommendations for Outpatient Follow-up:  1. Follow-up with GI Dr. Braulio Mcfarland in 6 weeks 2. Follow-up with PCP 3. Take medications as prescribed 4. Fall precaution 5. Continue PT  Discharge Diagnoses:  Active Hospital Problems   Diagnosis Date Noted  . Acute metabolic encephalopathy 54/66/2947  . Esophageal varices in alcoholic cirrhosis ([place])   . 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 Generalized weakness 06/15/2017  . Acute hepatic encephalopathy 06/15/2017  . Tobacco abuse 06/15/2017  . Hepatic encephalopathy ([place]) 06/15/2017  . Cirrhosis ([place])   . Thrombocytopenia [facility])     Resolved Hospital Problems  No resolved problems to display.    Discharge Condition: Stable  Diet recommendation: Resume previous diet  Vitals:   06/18/17 2048 06/19/17 0500  BP: 132/78 135/87  Pulse: 84 89  Resp: 20 20  Temp: 98.7 F (37.1 C) 98.2 F (36.8 C)  SpO2: 97% 96%    History of present illness:  Michael Curtis Wallaceis a 52 y.o.malewith medical history significant ofalcoholic abuse, alcoholic liver cirrhosis, alcohol withdrawal seizure, thrombocytopenia, tobacco abuse, GERD, stroke, depression, migraine headache, panic attacks, who presents with generalized weakness and confusion.  06/17/17: Patient seen and examined at his bedside.  He is alert and oriented x2.  Had 2 bowel movements this morning on lactulose.  Ammonia level on presentation.  Will repeat an ammonia level in the morning. Report  moderate headache with sensitivity to light and noise.  Migraine cocktail given.  06/18/2017: Patient seen and examined with his daughter-in-law at bedside.  Had EGD done this morning.   Which revealed varices with no bleeding.  As reported by GI.  GI signed off and recommended follow up as outpatient in 6 weeks.  Patient has no new complaints.  On the day of discharge, patient was hemodynamically stable.  He would need to continue physical therapy.  Follow-up with GI and PCP posthospitalization.  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 Follow-up with GI outpatient Dr. Loletha Carrow in 6 weeks.  Chronic thrombocytopenia Stable No sign of overt bleeding   Procedures:  EGD on 06/18/2017  Consultations:  GI  Discharge Exam: BP 135/87 (BP Location: Left Arm)   Pulse 89   Temp 98.2 F (36.8 C) (Oral)   Resp 20   Ht 6' (1.829 m)   Wt 95.9 kg (211 lb 6.4 oz)   SpO2 96%   BMI 28.67 kg/m   General: 52 yo CM WD WN NAD A&O x 3. Cardiovascular: RRR no rubs or gallops Respiratory: CTA no wheezes or rales  Discharge Instructions You were cared for by a hospitalist during your hospital stay. If you have any questions about your discharge medications or the care you received while you were in the hospital after you are discharged, you can call the unit and asked to speak with the hospitalist on call if the hospitalist that took care  of you is not available. Once you are discharged, your primary care physician will handle any further medical issues. Please note that NO REFILLS for any discharge medications will be authorized once you are discharged, as it is imperative that you return to your primary care physician (or establish a relationship with a primary care physician if you do not have one) for your aftercare needs so that they can reassess your need for medications and monitor your lab values.   Allergies as of 06/19/2017   No Known Allergies     Medication List    TAKE these medications   feeding supplement (ENSURE ENLIVE) Liqd Take 237 mLs by mouth 2 (two) times daily between meals.   folic acid 1 MG tablet Commonly known as:  FOLVITE Take 1 tablet (1 mg total) by mouth daily.   hydrOXYzine 50 MG tablet Commonly known as:  ATARAX/VISTARIL Take 1 tablet (50 mg total) by mouth 3 (three) times daily as needed for anxiety (insomina).   lactulose 10 GM/15ML solution Commonly known as:  CHRONULAC Take 30 mLs (20 g total) by mouth 2 (two) times daily.   nicotine 21 mg/24hr patch Commonly known as:  NICODERM CQ - dosed in mg/24 hours Place 1 patch (21 mg total) onto the skin daily.   thiamine 100 MG tablet Take 1 tablet (100 mg total) by mouth daily.      No Known Allergies Follow-up Information    Doran Stabler, MD Follow up on 08/04/2017.   Specialty:  Gastroenterology Why:  10:30 PM follow up with liver MD.  this replaces 5/1 appointment with Dr [name].   Contact information: [address] [place] [address] [phone]        [name], MD Follow up in 1 week(s).   Specialty:  Family Medicine Contact information: [address] [place] [address] [phone]            The results of significant diagnostics from this hospitalization (including imaging, microbiology, ancillary and laboratory) are listed below for reference.    Significant Diagnostic  Studies: Dg Chest 2 View  Result Date: 06/15/2017 CLINICAL DATA:  Recent hospitalization for seizure activity. The patient has been coming progressively weaker and is unable to function independently. The patient has lost control of his bowels. EXAM: CHEST - 2 VIEW COMPARISON:  PA and lateral chest x-ray of May 20, 2017 FINDINGS: The lungs are well-expanded. The interstitial markings are coarse though stable. The heart and pulmonary vascularity are normal. There is calcification in the wall of the aortic arch. There is no pleural effusion. There is chronic partial compression of the body of T12. There is mild multilevel degenerative disc disease of the thoracic spine. IMPRESSION: There is no acute pneumonia nor pulmonary edema. There are stable chronic bronchitic-smoking related changes. Electronically Signed   By: David  Martinique M.D.   On: 06/15/2017 16:38   Dg Chest 2 View  Result Date: 05/20/2017 CLINICAL DATA:  Recent seizure activity EXAM: CHEST  2 VIEW COMPARISON:  01/17/2017 FINDINGS: Cardiac shadow is enlarged but stable. The lungs are well aerated bilaterally. No focal infiltrate or sizable  effusion is seen. Lower thoracic compression deformity of uncertain age is noted. IMPRESSION: No acute abnormality noted. Likely chronic thoracic compression of in the lower thoracic spine. Electronically Signed   By: Inez Catalina M.D.   On: 05/20/2017 16:19   Ct Head Wo Contrast  Result Date: 06/15/2017 CLINICAL DATA:  Altered level of consciousness, seizures, history of ethanol abuse, emphysema, GERD, stroke, smoker EXAM: CT HEAD WITHOUT CONTRAST TECHNIQUE: Contiguous axial images were obtained from the base of the skull through the vertex without intravenous contrast. COMPARISON:  05/20/2017 FINDINGS: Brain: Normal ventricular morphology. No midline shift or mass effect. Normal appearance of brain parenchyma. No intracranial hemorrhage, mass lesion, evidence of acute infarction, or extra-axial fluid  collection. Vascular: No hyperdense vessels Skull: Intact Sinuses/Orbits: Clear Other: N/A IMPRESSION: Normal exam. Electronically Signed   By: Lavonia Dana M.D.   On: 06/15/2017 18:47   Ct Head Wo Contrast  Result Date: 05/20/2017 CLINICAL DATA:  Seizure and fall. EXAM: CT HEAD WITHOUT CONTRAST CT CERVICAL SPINE WITHOUT CONTRAST TECHNIQUE: Multidetector CT imaging of the head and cervical spine was performed following the standard protocol without intravenous contrast. Multiplanar CT image reconstructions of the cervical spine were also generated. COMPARISON:  CT scan of January 17, 2017. FINDINGS: CT HEAD FINDINGS Brain: No evidence of acute infarction, hemorrhage, hydrocephalus, extra-axial collection or mass lesion/mass effect. Vascular: No hyperdense vessel or unexpected calcification. Skull: Normal. Negative for fracture or focal lesion. Sinuses/Orbits: No acute finding. Other: None. CT CERVICAL SPINE FINDINGS Alignment: Normal. Skull base and vertebrae: No acute fracture. No primary bone lesion or focal pathologic process. Soft tissues and spinal canal: No prevertebral fluid or swelling. No visible canal hematoma. Disc levels: Status post surgical anterior fusion of C5-6 and C6-7. Mild degenerative joint disease is seen involving the posterior facet joints bilaterally. Upper chest: Negative. Other: None. IMPRESSION: Normal head CT. Status post surgical anterior fusion of C5-6 and C6-7. No acute abnormality seen in the cervical spine. Electronically Signed   By: Marijo Conception, M.D.   On: 05/20/2017 16:45   Ct Cervical Spine Wo Contrast  Result Date: 05/20/2017 CLINICAL DATA:  Seizure and fall. EXAM: CT HEAD WITHOUT CONTRAST CT CERVICAL SPINE WITHOUT CONTRAST TECHNIQUE: Multidetector CT imaging of the head and cervical spine was performed following the standard protocol without intravenous contrast. Multiplanar CT image reconstructions of the cervical spine were also generated. COMPARISON:  CT scan of  January 17, 2017. FINDINGS: CT HEAD FINDINGS Brain: No evidence of acute infarction, hemorrhage, hydrocephalus, extra-axial collection or mass lesion/mass effect. Vascular: No hyperdense vessel or unexpected calcification. Skull: Normal. Negative for fracture or focal lesion. Sinuses/Orbits: No acute finding. Other: None. CT CERVICAL SPINE FINDINGS Alignment: Normal. Skull base and vertebrae: No acute fracture. No primary bone lesion or focal pathologic process. Soft tissues and spinal canal: No prevertebral fluid or swelling. No visible canal hematoma. Disc levels: Status post surgical anterior fusion of C5-6 and C6-7. Mild degenerative joint disease is seen involving the posterior facet joints bilaterally. Upper chest: Negative. Other: None. IMPRESSION: Normal head CT. Status post surgical anterior fusion of C5-6 and C6-7. No acute abnormality seen in the cervical spine. Electronically Signed   By: Marijo Conception, M.D.   On: 05/20/2017 16:45   Mr Brain Wo Contrast  Result Date: 05/21/2017 CLINICAL DATA:  Seizure.  Alcohol abuse. EXAM: MRI HEAD WITHOUT CONTRAST TECHNIQUE: Multiplanar, multiecho pulse sequences of the brain and surrounding structures were obtained without intravenous contrast. COMPARISON:  Head CT 05/20/2017 and  MRI 02/15/2007 FINDINGS: Multiple sequences are mildly to moderately motion degraded. Brain: There is no evidence of acute infarct, intracranial hemorrhage, mass, midline shift, or extra-axial fluid collection. Mild cerebral atrophy has progressed from 2008. No significant cerebral white matter disease is seen for age. Dedicated temporal lobe imaging is motion degraded which precludes detailed assessment of the hippocampi. Vascular: Major intracranial vascular flow voids are preserved. Skull and upper cervical spine: Unremarkable bone marrow signal. Sinuses/Orbits: Unremarkable orbits. Minimal scattered paranasal sinus mucosal thickening. Clear mastoid air cells. Other: None.  IMPRESSION: 1. Motion degraded examination without evidence of acute intracranial abnormality. 2. Mild cerebral atrophy. Electronically Signed   By: Logan Bores M.D.   On: 05/21/2017 09:07   Mr Jeri Cos JO Contrast  Result Date: 06/16/2017 CLINICAL DATA:  Altered mental status EXAM: MRI HEAD WITHOUT AND WITH CONTRAST TECHNIQUE: Multiplanar, multiecho pulse sequences of the brain and surrounding structures were obtained without and with intravenous contrast. CONTRAST:  45mL MULTIHANCE GADOBENATE DIMEGLUMINE 529 MG/ML IV SOLN COMPARISON:  Head CT 06/15/2017 Brain MRI 05/21/2017 FINDINGS: Brain: The midline structures are normal. There is no acute infarct or acute hemorrhage. No mass lesion, hydrocephalus, dural abnormality or extra-axial collection. Mild periventricular white matter hyperintensity. Mild atrophy. No chronic microhemorrhage or superficial siderosis. Vascular: Major intracranial arterial and venous sinus flow voids are preserved. Skull and upper cervical spine: The visualized skull base, calvarium, upper cervical spine and extracranial soft tissues are normal. Sinuses/Orbits: No fluid levels or advanced mucosal thickening. No mastoid or middle ear effusion. Normal orbits. IMPRESSION: Mild atrophy without acute intracranial abnormality. Electronically Signed   By: Ulyses Jarred M.D.   On: 06/16/2017 22:22   US Abdomen Complete  Result Date: 06/17/2017 CLINICAL DATA:  Cirrhosis EXAM: ABDOMEN ULTRASOUND COMPLETE COMPARISON:  01/18/2017 FINDINGS: Gallbladder: Gallbladder is well distended. Multiple calculi are seen. No gallbladder wall thickening or pericholecystic fluid is noted. Common bile duct: Diameter: 4 mm. Liver: Mild nodularity of the liver is noted consistent with the given clinical history of cirrhosis. No underlying mass is seen. Portal vein is patent on color Doppler imaging with normal direction of blood flow towards the liver. IVC: No abnormality visualized. Pancreas: Visualized  portion unremarkable. Spleen: Size and appearance within normal limits. Right Kidney: Length: 12.1 cm. Echogenicity within normal limits. No mass or hydronephrosis visualized. Left Kidney: Length: 11.5 cm. Echogenicity within normal limits. No mass or hydronephrosis visualized. Abdominal aorta: Not well visualized due to overlying bowel gas. Other findings: None. IMPRESSION: Cholelithiasis without complicating factors. Changes of cirrhosis. Electronically Signed   By: Inez Catalina M.D.   On: 06/17/2017 12:36    Microbiology: No results found for this or any previous visit (from the past 240 hour(s)).   Labs: Basic Metabolic Panel: Recent Labs  Lab 06/15/17 1446 06/16/17 0037 06/17/17 0238  NA 137 138 138  K 4.3 4.1 3.9  CL 105 106 109  CO2 24 22 21*  GLUCOSE 121* 82 112*  BUN 7 9 10   CREATININE 1.01 1.07 1.02  CALCIUM 9.3 9.2 8.7*   Liver Function Tests: Recent Labs  Lab 06/15/17 1655 06/16/17 0037 06/17/17 0238  AST 60* 53* 45*  ALT 26 25 21   ALKPHOS 192* 179* 165*  BILITOT 2.2* 2.9* 1.7*  PROT 7.2 7.2 6.1*  ALBUMIN 3.1* 3.1* 2.7*   Recent Labs  Lab 06/15/17 1655  LIPASE 27   Recent Labs  Lab 06/15/17 1655 06/17/17 0238 06/17/17 1136  AMMONIA 44* 54* 29   CBC: Recent Labs  Lab 06/15/17 1309 06/16/17 0037 06/17/17 0238  WBC 6.7 6.9 7.0  HGB 15.1 14.1 12.5*  HCT 43.1 40.6 36.5*  MCV 97.5 96.9 97.6  PLT 109* 108* 95*   Cardiac Enzymes: No results for input(s): CKTOTAL, CKMB, CKMBINDEX, TROPONINI in the last 168 hours. BNP: BNP (last 3 results) No results for input(s): BNP in the last 8760 hours.  ProBNP (last 3 results) No results for input(s): PROBNP in the last 8760 hours.  CBG: Recent Labs  Lab 06/16/17 0520 06/17/17 0650 06/18/17 0520 06/19/17 0701  GLUCAP 110* 112* 103* 109*       Signed:  Kayleen Memos, MD [facility] 06/19/2017, 8:08 AM

## 2017-06-19 NOTE — Plan of Care (Signed)
  Problem: Nutrition: Goal: Adequate nutrition will be maintained Outcome: Completed/Met   Problem: Elimination: Goal: Will not experience complications related to bowel motility Outcome: Completed/Met   Problem: Pain Managment: Goal: General experience of comfort will improve Outcome: Completed/Met   Problem: Skin Integrity: Goal: Risk for impaired skin integrity will decrease Outcome: Completed/Met   

## 2017-06-20 LAB — AMMONIA: AMMONIA: 37 umol/L — AB (ref 9–35)

## 2017-06-20 LAB — GLUCOSE, CAPILLARY: Glucose-Capillary: 97 mg/dL (ref 65–99)

## 2017-06-20 MED ORDER — DIPHENHYDRAMINE HCL 50 MG/ML IJ SOLN
12.5000 mg | Freq: Once | INTRAMUSCULAR | Status: AC
  Administered 2017-06-20: 12.5 mg via INTRAVENOUS
  Filled 2017-06-20: qty 1

## 2017-06-20 MED ORDER — KETOROLAC TROMETHAMINE 15 MG/ML IJ SOLN
15.0000 mg | Freq: Once | INTRAMUSCULAR | Status: AC
  Administered 2017-06-20: 15 mg via INTRAVENOUS
  Filled 2017-06-20: qty 1

## 2017-06-20 MED ORDER — NADOLOL 20 MG PO TABS: 20.0000 mg | ORAL_TABLET | Freq: Every day | ORAL | 0 refills | Status: DC

## 2017-06-20 MED ORDER — NADOLOL 20 MG PO TABS
20.0000 mg | ORAL_TABLET | Freq: Every day | ORAL | Status: DC
  Administered 2017-06-20 – 2017-06-22 (×3): 20 mg via ORAL
  Filled 2017-06-20 (×3): qty 1

## 2017-06-20 MED ORDER — METOCLOPRAMIDE HCL 5 MG/ML IJ SOLN
5.0000 mg | Freq: Once | INTRAMUSCULAR | Status: AC
  Administered 2017-06-20: 5 mg via INTRAVENOUS
  Filled 2017-06-20: qty 2

## 2017-06-20 MED ORDER — LACTULOSE 10 GM/15ML PO SOLN: 10.0000 g | Freq: Three times a day (TID) | ORAL | Status: DC

## 2017-06-20 MED ORDER — LACTULOSE 10 GM/15ML PO SOLN
10.0000 g | Freq: Every day | ORAL | Status: DC
  Filled 2017-06-20: qty 15

## 2017-06-20 NOTE — Progress Notes (Signed)
I spoke with this patient's niece over the phone today. She had questions about possible Wernicke's encephalopathy, management of folate and thiamine, all of which can and should be managed by internal medicine. She asked about his chronic diarrhea prior to hospitalization, which can be addressed at office follow up with me on May 8th. I reviewed the EGD findings and the need for beta blocker therapy for varices.  Those recommendations are clearly noted in the EGD report and can be managed by internal medicine both during and after hospital discharge and further by me at follow up.  This patient will unquestionably need a primary care physician for much of this.  She also expressed frustration with communication and interactions with [facility].  I encouraged them to address this directly with Dr. Nevada Crane and/or [employer].

## 2017-06-20 NOTE — Progress Notes (Signed)
PROGRESS NOTE  TREON KEHL SEG:[id number] DOB: 06/02/1965  PCP: [name], MD  Admit date: 06/15/2017 Discharge date: 06/20/2017  Time spent: 25 minutes  06/18/17: Discharge delayed due to placement. Will continue to treat and monitor.  06/19/17: Seen and examined. good stool output on lactulose. Ammonia 33.  06/20/17: Seen and examined at his bedside. Reports having a migraine with sensitivity to light. Given combination of IV toradol, IV benadryl, and IV reglan once. Denies abd pain and nausea. Frequency of stool output is improved. Niece has question about wernicke's encephalopathy. Patient has been on folate and thiamine supplements since his admission 06/16/17. Vital signs and lab results have been stable. Patient is medically cleared for discharge and waiting for approval of PASARR to be discharged.  Recommendations for Outpatient Follow-up:  1. Follow-up with GI Dr. Loletha Carrow in 6 weeks 2. Follow-up with PCP 3. Take medications as prescribed 4. Fall precaution 5. Continue PT  Discharge Diagnoses:  Active Hospital Problems   Diagnosis Date Noted  . Acute metabolic encephalopathy 73/71/0626  . Esophageal varices in alcoholic cirrhosis ([place])   . 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 Generalized weakness 06/15/2017  . Acute hepatic encephalopathy 06/15/2017  . Tobacco abuse 06/15/2017  . Hepatic encephalopathy ([place]) 06/15/2017  . Cirrhosis ([place])   . Thrombocytopenia [facility])     Resolved Hospital Problems  No resolved problems to display.    Discharge Condition: Stable  Diet recommendation: Resume previous diet  Vitals:   06/20/17 0500 06/20/17 1228  BP: 131/76 127/81  Pulse: 80 81  Resp: 18 18  Temp: 98.4 F (36.9 C) 98.2 F (36.8 C)  SpO2: 96% 94%    History of present illness:  Michael Curtis Wallaceis a 52 y.o.malewith medical history significant ofalcoholic abuse, alcoholic liver cirrhosis, alcohol withdrawal seizure, thrombocytopenia, tobacco abuse, GERD,  stroke, depression, migraine headache, panic attacks, who presents with generalized weakness and confusion.  06/17/17: Patient seen and examined at his bedside.  He is alert and oriented x2.  Had 2 bowel movements this morning on lactulose.  Ammonia level on presentation.  Will repeat an ammonia level in the morning. Report  moderate headache with sensitivity to light and noise.  Migraine cocktail given.  06/18/2017: Patient seen and examined with his daughter-in-law at bedside.  Had EGD done this morning.  Which revealed varices with no bleeding.  As reported by GI.  GI signed off and recommended follow up as outpatient in 6 weeks.  Patient has no new complaints.  On the day of discharge, patient was hemodynamically stable.  He would need to continue physical therapy.  Follow-up with GI and PCP posthospitalization.  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 Follow-up with GI outpatient Dr. Loletha Carrow in 6 weeks. Low sodium diet and beta blocker as recommended by GI  Chronic thrombocytopenia  Stable No sign of overt bleeding  Hx of alcohol abuse Continue folate and thiamine supplements  Ambulatory dysfunction Continue physical rehab outpatient Fall  precaution   Procedures:  EGD on 06/18/2017  Consultations:  GI  Discharge Exam: BP 127/81 (BP Location: Left Arm)   Pulse 81   Temp 98.2 F (36.8 C) (Oral)   Resp 18   Ht 6' (1.829 m)   Wt 95.2 kg (209 lb 14.4 oz)   SpO2 94%   BMI 28.47 kg/m   06/20/17 seen and examined. General: 51 yo CM WD WN NAD A&O x 3 Cardiovascular: RRR no rubs or gallops Respiratory: CTA no wheezes or rales  Discharge Instructions You were cared for by a hospitalist during your hospital stay. If you have any questions about your discharge medications or the care you received while you were in the hospital after you are discharged, you can call the unit and asked to speak with the hospitalist on call if the hospitalist that took care of you is not available. Once you are discharged, your primary care physician will handle any further medical issues. Please note that NO REFILLS for any discharge medications will be authorized once you are discharged, as it is imperative that you return to your primary care physician (or establish a relationship with a primary care physician if you do not have one) for your aftercare needs so that they can reassess your need for medications and monitor your lab values.   Allergies as of 06/20/2017   No Known Allergies     Medication List    TAKE these medications   feeding supplement (ENSURE ENLIVE) Liqd Take 237 mLs by mouth 2 (two) times daily between meals.   folic acid 1 MG tablet Commonly known as:  FOLVITE Take 1 tablet (1 mg total) by mouth daily.   hydrOXYzine 50 MG tablet Commonly known as:  ATARAX/VISTARIL Take 1 tablet (50 mg total) by mouth 3 (three) times daily as needed for anxiety (insomina).   lactulose 10 GM/15ML solution Commonly known as:  CHRONULAC Take 30 mLs (20 g total) by mouth 2 (two) times daily.   nadolol 20 MG tablet Commonly known as:  CORGARD Take 1 tablet (20 mg total) by mouth daily. Start taking on:  06/21/2017   nicotine 21  mg/24hr patch Commonly known as:  NICODERM CQ - dosed in mg/24 hours Place 1 patch (21 mg total) onto the skin daily.   thiamine 100 MG tablet Take 1 tablet (100 mg total) by mouth daily.      No Known Allergies Follow-up Information    Doran Stabler, MD Follow up on 08/04/2017.   Specialty:  Gastroenterology Why:  10:30 PM follow up with liver MD.  this replaces 5/1 appointment with Dr [name].   Contact information: [address] [place] [address] [phone]        [name], MD Follow up in 1 week(s).   Specialty:  Family Medicine Contact information: [address] [place] [address] [phone]            The results of significant diagnostics from this hospitalization (including imaging, microbiology, ancillary and laboratory) are listed below for reference.    Significant Diagnostic Studies: Dg Chest 2 View  Result Date: 06/15/2017 CLINICAL DATA:  Recent hospitalization for seizure activity. The patient has been coming progressively weaker and is unable to function independently. The patient has lost control of his bowels. EXAM: CHEST - 2  VIEW COMPARISON:  PA and lateral chest x-ray of May 20, 2017 FINDINGS: The lungs are well-expanded. The interstitial markings are coarse though stable. The heart and pulmonary vascularity are normal. There is calcification in the wall of the aortic arch. There is no pleural effusion. There is chronic partial compression of the body of T12. There is mild multilevel degenerative disc disease of the thoracic spine. IMPRESSION: There is no acute pneumonia nor pulmonary edema. There are stable chronic bronchitic-smoking related changes. Electronically Signed   By: David  Martinique M.D.   On: 06/15/2017 16:38   Ct Head Wo Contrast  Result Date: 06/15/2017 CLINICAL DATA:  Altered level of consciousness, seizures, history of ethanol abuse, emphysema, GERD, stroke, smoker EXAM: CT HEAD WITHOUT CONTRAST  TECHNIQUE: Contiguous axial images were obtained from the base of the skull through the vertex without intravenous contrast. COMPARISON:  05/20/2017 FINDINGS: Brain: Normal ventricular morphology. No midline shift or mass effect. Normal appearance of brain parenchyma. No intracranial hemorrhage, mass lesion, evidence of acute infarction, or extra-axial fluid collection. Vascular: No hyperdense vessels Skull: Intact Sinuses/Orbits: Clear Other: N/A IMPRESSION: Normal exam. Electronically Signed   By: Lavonia Dana M.D.   On: 06/15/2017 18:47   Mr Jeri Cos DT Contrast  Result Date: 06/16/2017 CLINICAL DATA:  Altered mental status EXAM: MRI HEAD WITHOUT AND WITH CONTRAST TECHNIQUE: Multiplanar, multiecho pulse sequences of the brain and surrounding structures were obtained without and with intravenous contrast. CONTRAST:  54mL MULTIHANCE GADOBENATE DIMEGLUMINE 529 MG/ML IV SOLN COMPARISON:  Head CT 06/15/2017 Brain MRI 05/21/2017 FINDINGS: Brain: The midline structures are normal. There is no acute infarct or acute hemorrhage. No mass lesion, hydrocephalus, dural abnormality or extra-axial collection. Mild periventricular white matter hyperintensity. Mild atrophy. No chronic microhemorrhage or superficial siderosis. Vascular: Major intracranial arterial and venous sinus flow voids are preserved. Skull and upper cervical spine: The visualized skull base, calvarium, upper cervical spine and extracranial soft tissues are normal. Sinuses/Orbits: No fluid levels or advanced mucosal thickening. No mastoid or middle ear effusion. Normal orbits. IMPRESSION: Mild atrophy without acute intracranial abnormality. Electronically Signed   By: Ulyses Jarred M.D.   On: 06/16/2017 22:22   US Abdomen Complete  Result Date: 06/17/2017 CLINICAL DATA:  Cirrhosis EXAM: ABDOMEN ULTRASOUND COMPLETE COMPARISON:  01/18/2017 FINDINGS: Gallbladder: Gallbladder is well distended. Multiple calculi are seen. No gallbladder wall thickening or  pericholecystic fluid is noted. Common bile duct: Diameter: 4 mm. Liver: Mild nodularity of the liver is noted consistent with the given clinical history of cirrhosis. No underlying mass is seen. Portal vein is patent on color Doppler imaging with normal direction of blood flow towards the liver. IVC: No abnormality visualized. Pancreas: Visualized portion unremarkable. Spleen: Size and appearance within normal limits. Right Kidney: Length: 12.1 cm. Echogenicity within normal limits. No mass or hydronephrosis visualized. Left Kidney: Length: 11.5 cm. Echogenicity within normal limits. No mass or hydronephrosis visualized. Abdominal aorta: Not well visualized due to overlying bowel gas. Other findings: None. IMPRESSION: Cholelithiasis without complicating factors. Changes of cirrhosis. Electronically Signed   By: Inez Catalina M.D.   On: 06/17/2017 12:36    Microbiology: No results found for this or any previous visit (from the past 240 hour(s)).   Labs: Basic Metabolic Panel: Recent Labs  Lab 06/15/17 1446 06/16/17 0037 06/17/17 0238  NA 137 138 138  K 4.3 4.1 3.9  CL 105 106 109  CO2 24 22 21*  GLUCOSE 121* 82 112*  BUN 7 9 10   CREATININE 1.01 1.07  1.02  CALCIUM 9.3 9.2 8.7*   Liver Function Tests: Recent Labs  Lab 06/15/17 1655 06/16/17 0037 06/17/17 0238  AST 60* 53* 45*  ALT 26 25 21   ALKPHOS 192* 179* 165*  BILITOT 2.2* 2.9* 1.7*  PROT 7.2 7.2 6.1*  ALBUMIN 3.1* 3.1* 2.7*   Recent Labs  Lab 06/15/17 1655  LIPASE 27   Recent Labs  Lab 06/15/17 1655 06/17/17 0238 06/17/17 1136 06/19/17 0826 06/20/17 1020  AMMONIA 44* 54* 29 33 37*   CBC: Recent Labs  Lab 06/15/17 1309 06/16/17 0037 06/17/17 0238  WBC 6.7 6.9 7.0  HGB 15.1 14.1 12.5*  HCT 43.1 40.6 36.5*  MCV 97.5 96.9 97.6  PLT 109* 108* 95*   Cardiac Enzymes: No results for input(s): CKTOTAL, CKMB, CKMBINDEX, TROPONINI in the last 168 hours. BNP: BNP (last 3 results) No results for input(s): BNP in  the last 8760 hours.  ProBNP (last 3 results) No results for input(s): PROBNP in the last 8760 hours.  CBG: Recent Labs  Lab 06/16/17 0520 06/17/17 0650 06/18/17 0520 06/19/17 0701 06/20/17 0706  GLUCAP 110* 112* 103* 109* 97       Signed:  Kayleen Memos, MD [facility] 06/20/2017, 1:55 PM

## 2017-06-20 NOTE — Progress Notes (Signed)
Clinical Social Worker following patient for discharge needs. At this time patient PASARR is still under manual review and patient will be unable to discharge without PASARR number.   Rhea Pink, MSW,  [place]

## 2017-06-21 ENCOUNTER — Encounter (HOSPITAL_COMMUNITY): Payer: Self-pay | Admitting: Gastroenterology

## 2017-06-21 DIAGNOSIS — R7881 Bacteremia: Secondary | ICD-10-CM

## 2017-06-21 LAB — GLUCOSE, CAPILLARY: GLUCOSE-CAPILLARY: 104 mg/dL — AB (ref 65–99)

## 2017-06-21 LAB — AMMONIA: Ammonia: 28 umol/L (ref 9–35)

## 2017-06-21 MED ORDER — BUTALBITAL-APAP-CAFFEINE 50-325-40 MG PO TABS
1.0000 | ORAL_TABLET | Freq: Once | ORAL | Status: AC
  Administered 2017-06-21: 1 via ORAL
  Filled 2017-06-21: qty 1

## 2017-06-21 MED ORDER — PANTOPRAZOLE SODIUM 40 MG PO TBEC
40.0000 mg | DELAYED_RELEASE_TABLET | Freq: Every day | ORAL | Status: DC
  Administered 2017-06-22: 40 mg via ORAL
  Filled 2017-06-21: qty 1

## 2017-06-21 MED ORDER — KETOROLAC TROMETHAMINE 15 MG/ML IJ SOLN
15.0000 mg | Freq: Once | INTRAMUSCULAR | Status: AC
  Administered 2017-06-21: 15 mg via INTRAVENOUS
  Filled 2017-06-21: qty 1

## 2017-06-21 NOTE — Progress Notes (Signed)
Occupational Therapy Treatment Patient Details Name: Michael Mcfarland MRN: [id number] DOB: 1966/01/06 Today's Date: 06/21/2017    History of present illness Pt is a 52 y.o. male with PMH significant of alcoholic abuse, alcoholic liver cirrhosis, alcohol withdrawal seizure, thrombocytopenia, tobacco abuse, GERD, stroke, depression, migraine headache, panic attacks, who presents 06/15/17 with generalized weakness and confusion. Worked up for acute metabolic encephalopathy, possibly due to hepatic encephalopathy given h/o alcoholic liver cirrhosis and elevated ammonia level. CT-head negative for acute intracranial abnormalities.   OT comments  Pt demonstrating progress toward OT goals. He was able to complete ambulating simulated toilet transfers with min assist this session for stability as pt remains off balance in moderately distracting hallway environment. Pt continues to demonstrate slow processing and decreased awareness at times. He was able to effectively communicate his frustration with his situation but reason through the benefits of his continued hospitalization until able to secure rehabilitation bed. Pt would continue to benefit from OT services while admitted. D/C recommendation remains appropriate.   Follow Up Recommendations  SNF;Supervision/Assistance - 24 hour    Equipment Recommendations  None recommended by OT(defer to next venue of care)    Recommendations for Other Services      Precautions / Restrictions Precautions Precautions: Fall Restrictions Weight Bearing Restrictions: No       Mobility Bed Mobility Overal bed mobility: Needs Assistance Bed Mobility: Supine to Sit     Supine to sit: Supervision;HOB elevated     General bed mobility comments: Supervision for safety.   Transfers Overall transfer level: Needs assistance Equipment used: Rolling walker (2 wheeled) Transfers: Sit to/from Stand Sit to Stand: Min guard         General transfer comment:  Assistance to steady. Once ambulating, requiring min assist.     Balance Overall balance assessment: Needs assistance Sitting-balance support: Feet supported;No upper extremity supported Sitting balance-Leahy Scale: Good     Standing balance support: No upper extremity supported;During functional activity Standing balance-Leahy Scale: Poor Standing balance comment: Min assist for balance.                            ADL either performed or assessed with clinical judgement   ADL Overall ADL's : Needs assistance/impaired Eating/Feeding: Set up;Sitting   Grooming: Min guard;Standing                   Toilet Transfer: Minimal assistance;Ambulation Toilet Transfer Details (indicate cue type and reason): Simulated with sit<>stand followed by functional mobility.          Functional mobility during ADLs: Minimal assistance General ADL Comments: Min assist for stability with ambulating tasks.      Vision       Perception     Praxis      Cognition Arousal/Alertness: Awake/alert Behavior During Therapy: Flat affect Overall Cognitive Status: Impaired/Different from baseline Area of Impairment: Safety/judgement;Problem solving                         Safety/Judgement: Decreased awareness of safety;Decreased awareness of deficits   Problem Solving: Slow processing;Requires verbal cues General Comments: Noted slow processing and decreased executive functioning at times.         Exercises     Shoulder Instructions       General Comments No family or friends present during session.     Pertinent Vitals/ Pain       Pain Assessment: Faces  Faces Pain Scale: Hurts little more Pain Location: headache Pain Descriptors / Indicators: Discomfort;Headache Pain Intervention(s): Monitored during session;Limited activity within patient's tolerance;Repositioned;RN gave pain meds during session(RN gave pain meds at end of session)  Home Living                                           Prior Functioning/Environment              Frequency  Min 2X/week        Progress Toward Goals  OT Goals(current goals can now be found in the care plan section)  Progress towards OT goals: Progressing toward goals  Acute Rehab OT Goals Patient Stated Goal: none stated OT Goal Formulation: With patient Time For Goal Achievement: 06/30/17 Potential to Achieve Goals: Good  Plan Discharge plan remains appropriate    Co-evaluation                 AM-PAC PT "6 Clicks" Daily Activity     Outcome Measure   Help from another person eating meals?: A Little Help from another person taking care of personal grooming?: A Little Help from another person toileting, which includes using toliet, bedpan, or urinal?: A Little Help from another person bathing (including washing, rinsing, drying)?: A Lot Help from another person to put on and taking off regular upper body clothing?: A Little Help from another person to put on and taking off regular lower body clothing?: A Lot 6 Click Score: 16    End of Session Equipment Utilized During Treatment: Gait belt  OT Visit Diagnosis: Unsteadiness on feet (R26.81);Muscle weakness (generalized) (M62.81);Pain Pain - part of body: (head)   Activity Tolerance Patient tolerated treatment well   Patient Left in bed;with call bell/phone within reach;with family/visitor present   Nurse Communication Mobility status(RN present at end of session to give pain meds)        Time: 1420-1431 OT Time Calculation (min): 11 min  Charges: OT General Charges $OT Visit: 1 Visit OT Treatments $Self Care/Home Management : 8-22 mins  Norman Herrlich, MS OTR/L  Pager: [place] A Michael Mcfarland 06/21/2017, 3:46 PM

## 2017-06-21 NOTE — Progress Notes (Signed)
PROGRESS NOTE    HAYNES GIANNOTTI  PTW:[id number] DOB: 02-Sep-1965 DOA: 06/15/2017 PCP: [name], MD  Brief Narrative: Michael Mcfarland is a 52 y.o. male with medical history significant of alcoholic abuse, alcoholic liver cirrhosis, alcohol withdrawal seizure, thrombocytopenia, tobacco abuse, GERD, stroke, depression, migraine headache, panic attacks, who presents with generalized weakness and confusion. 2 previous admissions with ETOH withdrawal seizures  Assessment & Plan:   Acute Hepatic encephalopathy -improved with lactulose -MRI brain unrevealing -titrate lactulose for 2-3 soft stools/day -EGD unrevealing, no evidence of bleeding or infectious process at this time  H/o ETOH withdrawal seizures  -2 prior admissions with this -no ETOH since 2/24 -not on AEDs  Alcoholic cirrhosis -long standing ETOH abuse, quit 1 month back -EGD with grade 2 varices -started nadolol, add PPI -FU with Dr.Danis in 6weeks  Chronic Thrombocytopenia (HCC) -stable, monitor  Tobacco abuse -counseled  DVT prophylaxis: SCDs Code Status: Full COde Family Communication: extended family at bedside Disposition Plan: SNF when bed available  Consultants:   [facility]   Procedures:   Antimicrobials:    Subjective: -upset with staff, continues to have headache, diarrhea on lactulose  Objective: Vitals:   06/21/17 0500 06/21/17 0721 06/21/17 0840 06/21/17 1234  BP: 109/84  134/83 131/84  Pulse: 82  83 69  Resp: 18   18  Temp: 98.1 F (36.7 C)   98.4 F (36.9 C)  TempSrc: Oral   Oral  SpO2: 95%   98%  Weight:  94.7 kg (208 lb 12.8 oz)    Height:        Intake/Output Summary (Last 24 hours) at 06/21/2017 1542 Last data filed at 06/21/2017 0857 Gross per 24 hour  Intake 720 ml  Output 400 ml  Net 320 ml   Filed Weights   06/19/17 0500 06/20/17 0657 06/21/17 0721  Weight: 95.9 kg (211 lb 6.4 oz) 95.2 kg (209 lb 14.4 oz) 94.7 kg (208 lb 12.8 oz)    Examination:  General exam:  Appears calm and comfortable, chronically ill male, appears much older than stated age, stigmata of liver ds Respiratory system: CTAB Cardiovascular system: S1 & S2 heard, RRR. No JVD, murmurs, rubs, gallops Gastrointestinal system: Soft, obese, NT, BS present Central nervous system: Alert and oriented x3, moves all extremities, no asterixes Extremities: trace edema Skin: No rashes, lesions or ulcers Psychiatry: Judgement and insight appear normal. Mood & affect appropriate.     Data Reviewed:   CBC: Recent Labs  Lab 06/15/17 1309 06/16/17 0037 06/17/17 0238  WBC 6.7 6.9 7.0  HGB 15.1 14.1 12.5*  HCT 43.1 40.6 36.5*  MCV 97.5 96.9 97.6  PLT 109* 108* 95*   Basic Metabolic Panel: Recent Labs  Lab 06/15/17 1446 06/16/17 0037 06/17/17 0238  NA 137 138 138  K 4.3 4.1 3.9  CL 105 106 109  CO2 24 22 21*  GLUCOSE 121* 82 112*  BUN 7 9 10   CREATININE 1.01 1.07 1.02  CALCIUM 9.3 9.2 8.7*   GFR: Estimated Creatinine Clearance: 100 mL/min (by C-G formula based on SCr of 1.02 mg/dL). Liver Function Tests: Recent Labs  Lab 06/15/17 1655 06/16/17 0037 06/17/17 0238  AST 60* 53* 45*  ALT 26 25 21   ALKPHOS 192* 179* 165*  BILITOT 2.2* 2.9* 1.7*  PROT 7.2 7.2 6.1*  ALBUMIN 3.1* 3.1* 2.7*   Recent Labs  Lab 06/15/17 1655  LIPASE 27   Recent Labs  Lab 06/17/17 0238 06/17/17 1136 06/19/17 0826 06/20/17 1020 06/21/17 7001  AMMONIA 54* 29 33 37* 28   Coagulation Profile: Recent Labs  Lab 06/15/17 2323  INR 1.44   Cardiac Enzymes: No results for input(s): CKTOTAL, CKMB, CKMBINDEX, TROPONINI in the last 168 hours. BNP (last 3 results) No results for input(s): PROBNP in the last 8760 hours. HbA1C: No results for input(s): HGBA1C in the last 72 hours. CBG: Recent Labs  Lab 06/17/17 0650 06/18/17 0520 06/19/17 0701 06/20/17 0706 06/21/17 0703  GLUCAP 112* 103* 109* 97 104*   Lipid Profile: No results for input(s): CHOL, HDL, LDLCALC, TRIG, CHOLHDL,  LDLDIRECT in the last 72 hours. Thyroid Function Tests: No results for input(s): TSH, T4TOTAL, FREET4, T3FREE, THYROIDAB in the last 72 hours. Anemia Panel: No results for input(s): VITAMINB12, FOLATE, FERRITIN, TIBC, IRON, RETICCTPCT in the last 72 hours. Urine analysis:    Component Value Date/Time   COLORURINE YELLOW 06/15/2017 [place] 06/15/2017 1705   LABSPEC 1.006 06/15/2017 1705   PHURINE 5.0 06/15/2017 1705   GLUCOSEU NEGATIVE 06/15/2017 1705   HGBUR NEGATIVE 06/15/2017 1705   BILIRUBINUR NEGATIVE 06/15/2017 1705   KETONESUR NEGATIVE 06/15/2017 1705   PROTEINUR NEGATIVE 06/15/2017 1705   NITRITE NEGATIVE 06/15/2017 1705   LEUKOCYTESUR NEGATIVE 06/15/2017 1705   Sepsis Labs: @LABRCNTIP (procalcitonin:4,lacticidven:4)  )No results found for this or any previous visit (from the past 240 hour(s)).       Radiology Studies: No results found.      Scheduled Meds: . enoxaparin (LOVENOX) injection  40 mg Subcutaneous Q24H  . feeding supplement (ENSURE ENLIVE)  237 mL Oral BID BM  . folic acid  1 mg Oral Daily  . lactulose  10 g Oral Daily  . nadolol  20 mg Oral Daily  . nicotine  21 mg Transdermal Daily  . thiamine  100 mg Oral Daily   Continuous Infusions:   LOS: 6 days    Time spent: 52min    Domenic Polite, MD [facility] Page via [email or web address], password [email or web address] After 7PM please contact night-coverage  06/21/2017, 3:42 PM

## 2017-06-21 NOTE — Plan of Care (Signed)
  Problem: Clinical Measurements: Goal: Ability to maintain clinical measurements within normal limits will improve 06/21/2017 2248 by Alister Staver, Roma Kayser, RN Outcome: Progressing 06/21/2017 2246 by Clio Gerhart A, RN Outcome: Progressing   Problem: Clinical Measurements: Goal: Diagnostic test results will improve Outcome: Progressing   Problem: Activity: Goal: Risk for activity intolerance will decrease 06/21/2017 2248 by Dalicia Kisner, Roma Kayser, RN Outcome: Progressing 06/21/2017 2246 by Lilia Argue, RN Outcome: Progressing

## 2017-06-21 NOTE — Plan of Care (Signed)
  Problem: Clinical Measurements: Goal: Ability to maintain clinical measurements within normal limits will improve Outcome: Progressing   Problem: Activity: Goal: Risk for activity intolerance will decrease Outcome: Progressing   

## 2017-06-21 NOTE — Clinical Social Work Note (Addendum)
CSW provided patient with an update. [place] [facility] and [facility]) will review referral to see if they can extend a bed offer. Patient states he will likely move in with his sister after discharge from SNF.  Dayton Scrape, CSW [phone]  1:28 pm CSW uploaded requested documents into [facility] for PASARR review.  Dayton Scrape, CSW [phone]  1:33 pm PASARR obtained: [id number].  Dayton Scrape, [place]

## 2017-06-22 DIAGNOSIS — G9340 Encephalopathy, unspecified: Secondary | ICD-10-CM

## 2017-06-22 LAB — GLUCOSE, CAPILLARY: Glucose-Capillary: 114 mg/dL — ABNORMAL HIGH (ref 65–99)

## 2017-06-22 MED ORDER — NADOLOL 20 MG PO TABS: 20.0000 mg | ORAL_TABLET | Freq: Every day | ORAL | 0 refills | Status: DC

## 2017-06-22 MED ORDER — BUTALBITAL-APAP-CAFFEINE 50-325-40 MG PO TABS: 1.0000 | ORAL_TABLET | Freq: Every day | ORAL | 0 refills | Status: DC | PRN

## 2017-06-22 MED ORDER — ZOLPIDEM TARTRATE 5 MG PO TABS: 5.0000 mg | ORAL_TABLET | Freq: Every evening | ORAL | 0 refills | Status: DC | PRN

## 2017-06-22 MED ORDER — PANTOPRAZOLE SODIUM 40 MG PO TBEC: 40.0000 mg | DELAYED_RELEASE_TABLET | Freq: Every day | ORAL | 0 refills | Status: DC

## 2017-06-22 MED ORDER — KETOROLAC TROMETHAMINE 15 MG/ML IJ SOLN: 15.0000 mg | Freq: Every day | INTRAMUSCULAR | Status: DC | PRN

## 2017-06-22 MED ORDER — LACTULOSE 10 GM/15ML PO SOLN: ORAL | 0 refills | Status: DC

## 2017-06-22 MED ORDER — BUTALBITAL-APAP-CAFFEINE 50-325-40 MG PO TABS
1.0000 | ORAL_TABLET | Freq: Once | ORAL | Status: AC
  Administered 2017-06-22: 1 via ORAL
  Filled 2017-06-22: qty 1

## 2017-06-22 NOTE — Progress Notes (Signed)
Physical Therapy Treatment Patient Details Name: Michael Mcfarland MRN: [id number] DOB: 08/04/1965 Today's Date: 06/22/2017    History of Present Illness Pt is a 52 y.o. male with PMH significant of alcoholic abuse, alcoholic liver cirrhosis, alcohol withdrawal seizure, thrombocytopenia, tobacco abuse, GERD, stroke, depression, migraine headache, panic attacks, who presents 06/15/17 with generalized weakness and confusion. Worked up for acute metabolic encephalopathy, possibly due to hepatic encephalopathy given h/o alcoholic liver cirrhosis and elevated ammonia level. CT-head negative for acute intracranial abnormalities.    PT Comments    Pt unable to pay out of pocket for SNF placement and has made arrangements to stay with his sister who reports she can provide 24 hour assistance.  Based on ability to have support at home patient is safe to d/c home with his sister.  Pt educated to use AD at home based on balance deficits and patient verbalized understanding.  Pt will benefit from HHPT but reports he cannot pay out of pocket for these services.  Issued standing HEP.  Reviewed technique and frequency with patient and family.  Will inform supervising PT of change in recommendations.     Follow Up Recommendations  Home health PT;Supervision/Assistance - 24 hour(Pt reports he will be staying with his sister who can provide 24 hour assistance.  )     Equipment Recommendations  (no equipment needs at this time.  Pt reports he has a cane and RW at home.  )    Recommendations for Other Services       Precautions / Restrictions Precautions Precautions: Fall Restrictions Weight Bearing Restrictions: No    Mobility  Bed Mobility Overal bed mobility: Modified Independent Bed Mobility: Supine to Sit     Supine to sit: Modified independent (Device/Increase time) Sit to supine: Modified independent (Device/Increase time)   General bed mobility comments: No assistance needed.     Transfers Overall transfer level: Needs assistance Equipment used: None Transfers: Sit to/from Stand Sit to Stand: Supervision         General transfer comment: Close supervision.  Pt unsteady in standing but no overt LOB noted.  Pt performed from bed and commode.    Ambulation/Gait Ambulation/Gait assistance: Min guard Ambulation Distance (Feet): 120 Feet Assistive device: None Gait Pattern/deviations: Step-through pattern;Shuffle;Trunk flexed;Drifts right/left Gait velocity: Decreased Gait velocity interpretation: Below normal speed for age/gender General Gait Details: Pt with LOB to the R and L.  Pt able to correct and right balance.  Based on gait without device, educated patient that his safest option would use of RW or SPC ( at the least) to improve safety with mobility until balance improves.     Stairs            Wheelchair Mobility    Modified Rankin (Stroke Patients Only)       Balance     Sitting balance-Leahy Scale: Good       Standing balance-Leahy Scale: Fair                              Cognition Arousal/Alertness: Awake/alert Behavior During Therapy: Flat affect Overall Cognitive Status: Impaired/Different from baseline Area of Impairment: Safety/judgement                         Safety/Judgement: Decreased awareness of safety;Decreased awareness of deficits     General Comments: Noted slow processing and decreased executive functioning at times.  Exercises General Exercises - Lower Extremity Heel Slides: AROM;Both;10 reps;Standing Hip ABduction/ADduction: AROM;Both;5 reps;Standing Hip Flexion/Marching: AROM;Both;5 reps;Standing Mini-Sqauts: AROM;Both;5 reps;Standing    General Comments        Pertinent Vitals/Pain Pain Assessment: No/denies pain    Home Living                      Prior Function            PT Goals (current goals can now be found in the care plan section) Acute  Rehab PT Goals Patient Stated Goal: none stated Time For Goal Achievement: 06/30/17 Progress towards PT goals: Progressing toward goals    Frequency    Min 2X/week      PT Plan Current plan remains appropriate    Co-evaluation              AM-PAC PT "6 Clicks" Daily Activity  Outcome Measure  Difficulty turning over in bed (including adjusting bedclothes, sheets and blankets)?: None Difficulty moving from lying on back to sitting on the side of the bed? : None Difficulty sitting down on and standing up from a chair with arms (e.g., wheelchair, bedside commode, etc,.)?: A Little Help needed moving to and from a bed to chair (including a wheelchair)?: A Little Help needed walking in hospital room?: A Little Help needed climbing 3-5 steps with a railing? : A Little 6 Click Score: 20    End of Session   Activity Tolerance: Patient tolerated treatment well Patient left: in bed;with call bell/phone within reach;with family/visitor present Nurse Communication: Mobility status PT Visit Diagnosis: Other abnormalities of gait and mobility (R26.89);Repeated falls (R29.6)     Time: 8177-1165 PT Time Calculation (min) (ACUTE ONLY): 13 min  Charges:  $Gait Training: 8-22 mins                    G Codes:       Governor Rooks, PTA pager [phone]    Cristela Blue 06/22/2017, 11:34 AM

## 2017-06-22 NOTE — Clinical Social Work Note (Addendum)
[facility] and [facility] still reviewing referral but gave CSW private pay costs not including therapy and medications. [facility] has offered a bed. Admissions coordinator currently in morning meeting so left her a voicemail to check their costs as well.  Dayton Scrape, CSW [phone]  11:30 am CSW met with patient and sister at bedside to confirm plan to return home at discharge.  CSW signing off.   Dayton Scrape, [place]

## 2017-06-22 NOTE — Care Management Note (Addendum)
Case Management Note  Patient Details  Name: Michael Mcfarland MRN: [id number] Date of Birth: [birth year]  Subjective/Objective:    Acute Metabolic Encephalopathy               Action/Plan: CM talked to patient with his wife at the bedside; PCP is Dr Dimas Chyle; pharmacy of choice is [facility]; patient stated that he wants to go home and not to a nursing facility for rehab; [facility] choice offered, pt chose [place]; Dan with [facility] called for arrangements; DME - cane and walker at home  Expected Discharge Date:  06/18/17               Expected Discharge Plan:  [place]  In-House Referral:  Clinical Social Work  Discharge planning Services  CM Consult  Choice offered to:  Patient, Spouse  HH Arranged:  PT, OT, Social Work [employer] Agency:  [place]  Status of Service:  In process, will continue to follow  Sherrilyn Rist [phone] 06/22/2017, 11:07 AM

## 2017-06-22 NOTE — Progress Notes (Signed)
Pt and family refusing SNF placement, MD made aware.

## 2017-06-22 NOTE — Discharge Summary (Signed)
Physician Discharge Summary  Michael Mcfarland QVZ:[id number] DOB: [birth year] DOA: 06/15/2017  PCP: [name], MD  Admit date: 06/15/2017 Discharge date: 06/22/2017  Time spent: 35 minutes  Recommendations for Outpatient Follow-up:  PCP Dr.Parker in 1 week, advised pt not to take more than 1-40fioricets per day for his severe chronic headaches Unable to afford SNF for Rehab [facility] Dr.Danis in 6weeks  Discharge Diagnoses:  Principal Problem:   Acute metabolic encephalopathy Active Problems:   Cirrhosis ([place])   Thrombocytopenia ([place])   Generalized weakness   Acute hepatic encephalopathy   Tobacco abuse   Hepatic encephalopathy ([place])   Esophageal varices in alcoholic cirrhosis ([place])   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 2 previous admissions with ETOH withdrawal seizures  Hospital Course:  Acute Hepatic encephalopathy -improved with lactulose -MRI brain unrevealing -titrate lactulose for 2-3 soft stools/day -EGD with grade 2 varices, started nadolol -discharged home on lactulose to titrate for 2-3 soft stools/day -SNF considered by uninsured and unable to pay for this, discharged home with [facility] services  H/o ETOH withdrawal seizures  -2 prior admissions with this -no ETOH since 2/24 -not on AEDs  Alcoholic cirrhosis -long standing ETOH abuse, quit 1 month back -EGD with grade 2 varices -started nadolol, added PPI -FU with Dr.Danis in  6weeks  Chronic Thrombocytopenia (HCC) -stable, monitor  Tobacco abuse -counseled    Procedures:  EGD with grade 2 varices  Consultations:  [facility]  Discharge Exam: Vitals:   06/21/17 1946 06/22/17 0414  BP: 130/81 (!) 130/92  Pulse: 76 72  Resp: 18 18  Temp: 98.1 F (36.7 C) 98.3 F (36.8 C)  SpO2: 99% 94%    General: AAOx3 Cardiovascular: S1S2/RRR Respiratory: CTAB  Discharge Instructions    Allergies as of 06/22/2017   No Known Allergies     Medication List    STOP taking these medications   hydrOXYzine 50 MG tablet Commonly known as:  ATARAX/VISTARIL     TAKE these medications   butalbital-acetaminophen-caffeine 50-325-40 MG tablet Commonly known as:  FIORICET, ESGIC Take 1-2 tablets by mouth daily as needed for headache.   feeding supplement (ENSURE ENLIVE) Liqd Take 237 mLs by mouth 2 (two) times daily between meals.   folic acid 1 MG tablet Commonly known as:  FOLVITE Take 1 tablet (1 mg total) by mouth daily.   lactulose 10 GM/15ML solution Commonly known as:  CHRONULAC Take 10mg  anywhere between once or three times a day to titrate for 2-3 soft stools per day   nadolol 20 MG tablet Commonly known as:  CORGARD Take 1 tablet (20 mg total) by mouth daily.   nicotine 21 mg/24hr patch Commonly known as:  NICODERM CQ - dosed in mg/24 hours Place 1 patch (21 mg total) onto the skin daily.   pantoprazole 40 MG tablet Commonly known as:  PROTONIX Take 1 tablet (40 mg total) by mouth daily at 12 noon.   thiamine 100 MG tablet Take 1 tablet (100 mg total) by mouth daily.   zolpidem  5 MG tablet Commonly known as:  AMBIEN Take 1 tablet (5 mg total) by mouth at bedtime as needed for sleep.      No Known Allergies Follow-up Information    Doran Stabler, MD Follow up on 08/04/2017.   Specialty:  Gastroenterology Why:  10:30 PM follow up with liver MD.  this replaces 5/1 appointment with Dr [name].   Contact information: [address] [place] [address] [phone]        [name], MD. Go on 07/01/2017.   Specialty:  Family Medicine Why:  @10 :Max Sane information: [place] [address] [place] Follow up.   Why:  They will do your home health care at your home Contact information: [address] [place] [address] [phone]            The results of significant diagnostics from this hospitalization (including imaging, microbiology, ancillary and laboratory) are listed below for reference.    Significant Diagnostic Studies: Dg Chest 2 View  Result Date: 06/15/2017 CLINICAL DATA:  Recent hospitalization for seizure activity. The patient has been coming progressively weaker and is unable to function independently. The patient has lost control of his bowels. EXAM: CHEST - 2 VIEW COMPARISON:  PA and lateral chest x-ray of May 20, 2017 FINDINGS: The lungs are well-expanded. The interstitial markings are coarse though stable. The heart and pulmonary vascularity are normal. There is calcification in the wall of the aortic arch. There is no pleural effusion. There is chronic partial compression of the body of T12. There is mild multilevel degenerative disc disease of the thoracic spine. IMPRESSION: There is no acute pneumonia nor pulmonary edema. There are stable chronic bronchitic-smoking related changes. Electronically Signed   By: David  Martinique M.D.   On: 06/15/2017 16:38   Ct Head Wo Contrast  Result Date: 06/15/2017 CLINICAL DATA:  Altered level of consciousness, seizures, history of ethanol abuse, emphysema, GERD, stroke, smoker EXAM: CT HEAD WITHOUT CONTRAST TECHNIQUE: Contiguous axial images were obtained from the base of the skull through the vertex without intravenous contrast. COMPARISON:  05/20/2017 FINDINGS: Brain: Normal ventricular morphology. No midline shift or mass effect. Normal appearance of brain  parenchyma. No intracranial hemorrhage, mass lesion, evidence of acute infarction, or extra-axial fluid collection. Vascular: No hyperdense vessels Skull: Intact Sinuses/Orbits: Clear Other: N/A IMPRESSION: Normal exam. Electronically Signed   By: Lavonia Dana M.D.   On: 06/15/2017 18:47   Mr Jeri Cos AY Contrast  Result Date: 06/16/2017 CLINICAL DATA:  Altered mental status EXAM: MRI HEAD WITHOUT AND WITH CONTRAST TECHNIQUE: Multiplanar, multiecho pulse sequences of the brain and surrounding structures were obtained without and with intravenous contrast. CONTRAST:  13mL MULTIHANCE GADOBENATE DIMEGLUMINE 529 MG/ML IV SOLN COMPARISON:  Head CT 06/15/2017 Brain MRI 05/21/2017 FINDINGS: Brain: The midline structures are normal. There is no acute infarct or acute hemorrhage. No mass lesion, hydrocephalus, dural abnormality or extra-axial collection. Mild periventricular white matter hyperintensity. Mild atrophy. No chronic microhemorrhage or superficial siderosis. Vascular: Major intracranial arterial and venous sinus flow voids are preserved. Skull and upper cervical spine: The visualized skull base, calvarium, upper cervical spine and extracranial soft tissues are normal. Sinuses/Orbits: No fluid levels or advanced mucosal thickening. No mastoid or middle ear effusion. Normal orbits. IMPRESSION: Mild atrophy without acute intracranial abnormality. Electronically Signed   By: Ulyses Jarred M.D.   On: 06/16/2017 22:22  US Abdomen Complete  Result Date: 06/17/2017 CLINICAL DATA:  Cirrhosis EXAM: ABDOMEN ULTRASOUND COMPLETE COMPARISON:  01/18/2017 FINDINGS: Gallbladder: Gallbladder is well distended. Multiple calculi are seen. No gallbladder wall thickening or pericholecystic fluid is noted. Common bile duct: Diameter: 4 mm. Liver: Mild nodularity of the liver is noted consistent with the given clinical history of cirrhosis. No underlying mass is seen. Portal vein is patent on color Doppler imaging with normal  direction of blood flow towards the liver. IVC: No abnormality visualized. Pancreas: Visualized portion unremarkable. Spleen: Size and appearance within normal limits. Right Kidney: Length: 12.1 cm. Echogenicity within normal limits. No mass or hydronephrosis visualized. Left Kidney: Length: 11.5 cm. Echogenicity within normal limits. No mass or hydronephrosis visualized. Abdominal aorta: Not well visualized due to overlying bowel gas. Other findings: None. IMPRESSION: Cholelithiasis without complicating factors. Changes of cirrhosis. Electronically Signed   By: Inez Catalina M.D.   On: 06/17/2017 12:36    Microbiology: No results found for this or any previous visit (from the past 240 hour(s)).   Labs: Basic Metabolic Panel: Recent Labs  Lab 06/15/17 1446 06/16/17 0037 06/17/17 0238  NA 137 138 138  K 4.3 4.1 3.9  CL 105 106 109  CO2 24 22 21*  GLUCOSE 121* 82 112*  BUN 7 9 10   CREATININE 1.01 1.07 1.02  CALCIUM 9.3 9.2 8.7*   Liver Function Tests: Recent Labs  Lab 06/15/17 1655 06/16/17 0037 06/17/17 0238  AST 60* 53* 45*  ALT 26 25 21   ALKPHOS 192* 179* 165*  BILITOT 2.2* 2.9* 1.7*  PROT 7.2 7.2 6.1*  ALBUMIN 3.1* 3.1* 2.7*   Recent Labs  Lab 06/15/17 1655  LIPASE 27   Recent Labs  Lab 06/17/17 0238 06/17/17 1136 06/19/17 0826 06/20/17 1020 06/21/17 0627  AMMONIA 54* 29 33 37* 28   CBC: Recent Labs  Lab 06/15/17 1309 06/16/17 0037 06/17/17 0238  WBC 6.7 6.9 7.0  HGB 15.1 14.1 12.5*  HCT 43.1 40.6 36.5*  MCV 97.5 96.9 97.6  PLT 109* 108* 95*   Cardiac Enzymes: No results for input(s): CKTOTAL, CKMB, CKMBINDEX, TROPONINI in the last 168 hours. BNP: BNP (last 3 results) No results for input(s): BNP in the last 8760 hours.  ProBNP (last 3 results) No results for input(s): PROBNP in the last 8760 hours.  CBG: Recent Labs  Lab 06/18/17 0520 06/19/17 0701 06/20/17 0706 06/21/17 0703 06/22/17 0727  GLUCAP 103* 109* 97 104* 114*        Signed:  Domenic Polite MD.  [facility] 06/22/2017, 12:00 PM

## 2017-06-23 ENCOUNTER — Telehealth: Payer: Self-pay

## 2017-06-23 NOTE — Telephone Encounter (Signed)
TCM call attempted.  Left message to return call.  Patient is already scheduled for hospital follow up appointment on 07/01/2017, but will call again tomorrow to attempt TCM call.

## 2017-06-24 ENCOUNTER — Ambulatory Visit: Payer: Self-pay | Admitting: Family Medicine

## 2017-06-28 ENCOUNTER — Telehealth: Payer: Self-pay | Admitting: Family Medicine

## 2017-06-28 NOTE — Telephone Encounter (Signed)
Copied from [place] [phone]. Topic: General - Other >> Jun 28, 2017 12:14 PM Margot Ables wrote: Reason for CRM: PT eval after hospitalization was completed today. Recommending PT 2 x week for 3 weeks for fall risk reduction. Also recommending referral to ST for reduce cognition for past 4-5 months.

## 2017-06-28 NOTE — Telephone Encounter (Signed)
Spoke with Clair Gulling and gave verbal orders.

## 2017-06-29 NOTE — Telephone Encounter (Signed)
LM to return call for TCM. 

## 2017-06-30 ENCOUNTER — Telehealth: Payer: Self-pay | Admitting: Family Medicine

## 2017-06-30 NOTE — Telephone Encounter (Signed)
Copied from [place] [phone]. Topic: Quick Communication - See Telephone Encounter >> Jun 30, 2017  4:50 PM Valla Leaver wrote: CRM for notification. See Telephone encounter for: 06/30/17.   Herbert Deaner PT,calling to report the patient's blood pressure seated is 120/80 and standing 110/70.   Patient has a history of falls.  Patient has reported inconsistent and itntermittent dizziness.  He has 7 loose stools per day. Please call patient to discuss dosage for Lactulos.

## 2017-06-30 NOTE — Telephone Encounter (Signed)
See note

## 2017-06-30 NOTE — Telephone Encounter (Signed)
Copied from [place] [phone]. Topic: Inquiry >> Jun 30, 2017  2:38 PM Lennox Solders wrote: Reason for CRM: [facility]onna Minium [facility] is requesting verbal orders for speech therapy for once a wk for 4 wks

## 2017-07-01 ENCOUNTER — Telehealth: Payer: Self-pay

## 2017-07-01 ENCOUNTER — Inpatient Hospital Stay: Payer: Self-pay | Admitting: Family Medicine

## 2017-07-01 NOTE — Telephone Encounter (Signed)
Gave OK for orders and called OT

## 2017-07-01 NOTE — Telephone Encounter (Signed)
Copied from [place]. Topic: Inquiry >> Jul 01, 2017  1:07 PM Pricilla Handler wrote: Reason for CRM: Henderson Newcomer with [facility] called requesting verbal approval for OT for 2 times a week for 1 Week, 1 time a week for 4 weeks. Please call Manuela Schwartz at [phone].       Thank You!!!

## 2017-07-01 NOTE — Telephone Encounter (Signed)
Patient should adjust amount of lactulose as needed for 3 bowel movements daily. Would recommend him start by cutting current dose in half and adjusting as needed.  Michael Mcfarland. [name], MD 07/01/2017 2:27 PM

## 2017-07-01 NOTE — Telephone Encounter (Signed)
Please advise 

## 2017-07-02 NOTE — Telephone Encounter (Signed)
See note

## 2017-07-02 NOTE — Telephone Encounter (Signed)
Orders given.  

## 2017-07-02 NOTE — Telephone Encounter (Signed)
Michael Mcfarland with [facility] is calling to check status on getting verbal orders. Pt is set up to be seen Monday but will have to cancel if orders aren't given by that time.

## 2017-07-02 NOTE — Telephone Encounter (Signed)
Tried to call patient.  No answer.  Voicemail not set up. 

## 2017-07-08 ENCOUNTER — Ambulatory Visit: Payer: Self-pay | Admitting: Family Medicine

## 2017-07-08 ENCOUNTER — Encounter: Payer: Self-pay | Admitting: Family Medicine

## 2017-07-08 ENCOUNTER — Telehealth: Payer: Self-pay | Admitting: Family Medicine

## 2017-07-08 DIAGNOSIS — G4700 Insomnia, unspecified: Secondary | ICD-10-CM

## 2017-07-08 DIAGNOSIS — G8929 Other chronic pain: Secondary | ICD-10-CM

## 2017-07-08 DIAGNOSIS — F419 Anxiety disorder, unspecified: Secondary | ICD-10-CM

## 2017-07-08 DIAGNOSIS — M545 Low back pain, unspecified: Secondary | ICD-10-CM

## 2017-07-08 MED ORDER — LACTULOSE 10 GM/15ML PO SOLN: ORAL | 5 refills | Status: DC

## 2017-07-08 MED ORDER — DICLOFENAC SODIUM 75 MG PO TBEC: 75.0000 mg | DELAYED_RELEASE_TABLET | Freq: Two times a day (BID) | ORAL | 0 refills | Status: DC

## 2017-07-08 MED ORDER — NADOLOL 20 MG PO TABS: 20.0000 mg | ORAL_TABLET | Freq: Every day | ORAL | 3 refills | Status: DC

## 2017-07-08 MED ORDER — PANTOPRAZOLE SODIUM 40 MG PO TBEC: 40.0000 mg | DELAYED_RELEASE_TABLET | Freq: Every day | ORAL | 3 refills | Status: AC

## 2017-07-08 MED ORDER — BUTALBITAL-APAP-CAFFEINE 50-325-40 MG PO TABS: 1.0000 | ORAL_TABLET | Freq: Every day | ORAL | 0 refills | Status: DC | PRN

## 2017-07-08 MED ORDER — AMITRIPTYLINE HCL 50 MG PO TABS: 50.0000 mg | ORAL_TABLET | Freq: Every day | ORAL | 3 refills | Status: DC

## 2017-07-08 NOTE — Assessment & Plan Note (Signed)
Start amitriptyline as noted above. Start at 50mg . Increase to 100mg  in 1-2 weeks if tolerating well without side effects. May consider addition of medication such as seroquel to help with anxiety and insomnia pending response to amitriptyline.

## 2017-07-08 NOTE — Assessment & Plan Note (Signed)
No red flag signs or symptoms.  Likely secondary to muscular strain related to his seizure a few months ago.  We will start amitriptyline as noted above which will also hopefully help him with his sleep in addition to his chronic pain.  I will also start diclofenac 75 mg twice daily as needed for breakthrough pain.  Options are limited given his history of cirrhosis.

## 2017-07-08 NOTE — Assessment & Plan Note (Signed)
Stop ambien. Start amitriptyline 50mg  qhs. Would avoid benzos and non-benzo sedatives given history of alcohol abuse and cirrhosis. Follow up in 6-8 weeks.

## 2017-07-08 NOTE — Progress Notes (Signed)
    Subjective:  Michael Mcfarland is a 52 y.o. male who presents today with a chief complaint of insomnia.   HPI:  Insomnia, chronic problem, new to this provide.  Several year history.  Symptoms have worsened over the past several days to weeks.  Usually goes to sleep around 10 or 11 and then will wake up a few hours later.  Has significant difficulty going back to sleep.  Has tried Ambien in the past which does not significantly seem to help.  Has also tried Xanax and Klonopin in the past which have helped a little bit.  Anxiety, chronic problem, new to this provider Also several year history.  As noted above, he has been on Xanax and Klonopin in the past which helped a little bit.  Far as he knows, he has not been on anything else in the past for this.  Symptoms are a little bit worse today due to life stressors including his medical illnesses and financial issues regarding his home.   Low back pain, chronic problem, new to this provider Symptoms started several months ago when he had a seizure due to alcohol withdrawal.  Since then he has had low back pain.  He tried taking over-the-counter anti-inflammatories which have not centrically seem to help.  ROS: Per HPI  PMH: He reports that he has been smoking cigarettes.  He has a 8.75 pack-year smoking history. He has never used smokeless tobacco. He reports that he drank about 10.8 oz of alcohol per week. He reports that he has current or past drug history.  Objective:  Physical Exam: BP 132/86 (BP Location: Left Arm)   Pulse 67   Temp 97.6 F (36.4 C) (Oral)   Resp 16   Ht 5\' 10"  (1.778 m)   Wt 217 lb (98.4 kg)   SpO2 98%   BMI 31.14 kg/m   Gen: NAD, resting comfortably CV: RRR with no murmurs appreciated Pulm: NWOB, CTAB with no crackles, wheezes, or rhonchi GI: Normal bowel sounds present. Soft, Nontender, Nondistended. MSK:  -Back: No deformities.  Mildly tender to palpation along lumbar paraspinal muscles. -Legs: No  deformities.  Strength 5 out of 5 throughout. SRL negative bilaterally.  Skin: Warm, dry Neuro: Grossly normal, moves all extremities Psych: Normal affect and thought content  Assessment/Plan:  Insomnia Stop ambien. Start amitriptyline 50mg  qhs. Would avoid benzos and non-benzo sedatives given history of alcohol abuse and cirrhosis. Follow up in 6-8 weeks.   Anxiety Start amitriptyline as noted above. Start at 50mg . Increase to 100mg  in 1-2 weeks if tolerating well without side effects. May consider addition of medication such as seroquel to help with anxiety and insomnia pending response to amitriptyline.   Low back pain No red flag signs or symptoms.  Likely secondary to muscular strain related to his seizure a few months ago.  We will start amitriptyline as noted above which will also hopefully help him with his sleep in addition to his chronic pain.  I will also start diclofenac 75 mg twice daily as needed for breakthrough pain.  Options are limited given his history of cirrhosis.  Algis Greenhouse. [name], MD 07/08/2017 5:00 PM

## 2017-07-08 NOTE — Telephone Encounter (Signed)
See note

## 2017-07-08 NOTE — Telephone Encounter (Signed)
Copied from [place]. Topic: Quick Communication - Rx Refill/Question >> Jul 08, 2017  5:15 PM Waylan Rocher, [place] wrote: Medication: lactulose (CHRONULAC) 10 GM/15ML solution  Has the patient contacted their pharmacy? Yes.   (Agent: If no, request that the patient contact the pharmacy for the refill.) Preferred Pharmacy (with phone number or street name): [facility] [place] Agent: Please be advised that RX refills may take up to 3 business days. We ask that you follow-up with your pharmacy.  Pharmacy calling to clarify instructions before they can fill it.

## 2017-07-08 NOTE — Patient Instructions (Signed)
Stop the [place]. Star the amitryptyline. You can increase to 2 pills nightly in a week or so.  Use the diclofenac as needed.  Come back to see me in 6-8 weeks, or sooner as needed.  Take care, Dr [name]

## 2017-07-09 ENCOUNTER — Other Ambulatory Visit: Payer: Self-pay

## 2017-07-09 MED ORDER — LACTULOSE 10 GM/15ML PO SOLN: ORAL | 5 refills | Status: DC

## 2017-07-09 NOTE — Telephone Encounter (Signed)
Corrected Rx has been sent to pharmacy.

## 2017-07-13 ENCOUNTER — Telehealth: Payer: Self-pay | Admitting: Family Medicine

## 2017-07-13 NOTE — Telephone Encounter (Signed)
fyi

## 2017-07-13 NOTE — Telephone Encounter (Signed)
Copied from [place]. Topic: Inquiry >> Jul 13, 2017  1:53 PM Cyra Spader, [place], [place] wrote: Reason for CRM: Michael Mcfarland is calling from [facility] to let Dr.Park know that the patient didn't receive PT today do to the fact that his BP was running high 160/106 in his left arm and also he has a migraine as well. Rated his migraine 8 out of 10. Patient stated that he took his BP Medication around 12 this afternoon. Beth did advise patient to keep check of his BP and if his BP was still running him to call the doctors office. If anyone has any further questions for her she can be reached at [phone]

## 2017-07-13 NOTE — Telephone Encounter (Signed)
Noted. Pt should see me soon if BP is persistently elevated above 140/90.  Algis Greenhouse. [name], MD 07/13/2017 2:56 PM

## 2017-07-13 NOTE — Telephone Encounter (Signed)
See note

## 2017-07-15 ENCOUNTER — Ambulatory Visit: Payer: Self-pay | Admitting: *Deleted

## 2017-07-15 NOTE — Telephone Encounter (Signed)
Please advise 

## 2017-07-15 NOTE — Telephone Encounter (Addendum)
Elevated BP- seated 160/90 Standing 130/90 Dizziness - patient is orthostatic normally  P 104  Patient reports he ran out of [place] on Monday. Needs that called to pharmacy.  Report: PT: Chronic back pain- worse since hospitalization- hard to tolerate PT- has to sit- recommendation is nursing evaluation and Discharge from PT at this point- will continue at later date if needed.  Hx migraine- using increased amount of Fioricet- using more recently- due to pollen (the reason for more frequent use)  Any additional questions for PT: Clair Gulling  [phone]  Spoke to [place] at office- did call Clair Gulling- left message to send report requesting discontinuation of PT for signature.  Patient notified to pick up his BP medication- he will pick up tonight and restart

## 2017-07-15 NOTE — Telephone Encounter (Signed)
Patient had 90 day supply sent in earlier this week - should be available at his pharmacy.  Algis Greenhouse. [name], MD 07/15/2017 5:20 PM

## 2017-07-15 NOTE — Telephone Encounter (Signed)
See note

## 2017-07-16 ENCOUNTER — Other Ambulatory Visit: Payer: Self-pay

## 2017-07-16 ENCOUNTER — Emergency Department (HOSPITAL_COMMUNITY): Payer: Self-pay

## 2017-07-16 ENCOUNTER — Encounter (HOSPITAL_COMMUNITY): Payer: Self-pay | Admitting: Emergency Medicine

## 2017-07-16 ENCOUNTER — Inpatient Hospital Stay (HOSPITAL_COMMUNITY)
Admission: EM | Admit: 2017-07-16 | Discharge: 2017-07-24 | DRG: 082 | Disposition: A | Discharge status: Home Health | Payer: Self-pay | Attending: Family Medicine | Admitting: Family Medicine

## 2017-07-16 DIAGNOSIS — S065X9A Traumatic subdural hemorrhage with loss of consciousness of unspecified duration, initial encounter: Principal | ICD-10-CM | POA: Diagnosis present

## 2017-07-16 DIAGNOSIS — F329 Major depressive disorder, single episode, unspecified: Secondary | ICD-10-CM | POA: Diagnosis present

## 2017-07-16 DIAGNOSIS — F17210 Nicotine dependence, cigarettes, uncomplicated: Secondary | ICD-10-CM | POA: Diagnosis present

## 2017-07-16 DIAGNOSIS — I8510 Secondary esophageal varices without bleeding: Secondary | ICD-10-CM | POA: Diagnosis present

## 2017-07-16 DIAGNOSIS — F10232 Alcohol dependence with withdrawal with perceptual disturbance: Secondary | ICD-10-CM | POA: Diagnosis present

## 2017-07-16 DIAGNOSIS — F13239 Sedative, hypnotic or anxiolytic dependence with withdrawal, unspecified: Secondary | ICD-10-CM | POA: Diagnosis present

## 2017-07-16 DIAGNOSIS — K7460 Unspecified cirrhosis of liver: Secondary | ICD-10-CM | POA: Diagnosis present

## 2017-07-16 DIAGNOSIS — F1310 Sedative, hypnotic or anxiolytic abuse, uncomplicated: Secondary | ICD-10-CM | POA: Diagnosis not present

## 2017-07-16 DIAGNOSIS — F410 Panic disorder [episodic paroxysmal anxiety] without agoraphobia: Secondary | ICD-10-CM | POA: Diagnosis present

## 2017-07-16 DIAGNOSIS — W19XXXA Unspecified fall, initial encounter: Secondary | ICD-10-CM | POA: Diagnosis present

## 2017-07-16 DIAGNOSIS — R4182 Altered mental status, unspecified: Secondary | ICD-10-CM

## 2017-07-16 DIAGNOSIS — Z8673 Personal history of transient ischemic attack (TIA), and cerebral infarction without residual deficits: Secondary | ICD-10-CM

## 2017-07-16 DIAGNOSIS — K766 Portal hypertension: Secondary | ICD-10-CM | POA: Diagnosis present

## 2017-07-16 DIAGNOSIS — Z79899 Other long term (current) drug therapy: Secondary | ICD-10-CM

## 2017-07-16 DIAGNOSIS — R410 Disorientation, unspecified: Secondary | ICD-10-CM | POA: Diagnosis present

## 2017-07-16 DIAGNOSIS — S065XAA Traumatic subdural hemorrhage with loss of consciousness status unknown, initial encounter: Secondary | ICD-10-CM | POA: Diagnosis present

## 2017-07-16 DIAGNOSIS — Z781 Physical restraint status: Secondary | ICD-10-CM

## 2017-07-16 DIAGNOSIS — G92 Toxic encephalopathy: Secondary | ICD-10-CM | POA: Diagnosis present

## 2017-07-16 DIAGNOSIS — Y900 Blood alcohol level of less than 20 mg/100 ml: Secondary | ICD-10-CM | POA: Diagnosis present

## 2017-07-16 DIAGNOSIS — K219 Gastro-esophageal reflux disease without esophagitis: Secondary | ICD-10-CM | POA: Diagnosis present

## 2017-07-16 DIAGNOSIS — K7030 Alcoholic cirrhosis of liver without ascites: Secondary | ICD-10-CM | POA: Diagnosis present

## 2017-07-16 DIAGNOSIS — J439 Emphysema, unspecified: Secondary | ICD-10-CM | POA: Diagnosis present

## 2017-07-16 DIAGNOSIS — F10932 Alcohol use, unspecified with withdrawal with perceptual disturbance: Secondary | ICD-10-CM | POA: Diagnosis present

## 2017-07-16 DIAGNOSIS — D696 Thrombocytopenia, unspecified: Secondary | ICD-10-CM | POA: Diagnosis present

## 2017-07-16 DIAGNOSIS — Z981 Arthrodesis status: Secondary | ICD-10-CM

## 2017-07-16 LAB — URINALYSIS, ROUTINE W REFLEX MICROSCOPIC
Bilirubin Urine: NEGATIVE
Glucose, UA: NEGATIVE mg/dL
HGB URINE DIPSTICK: NEGATIVE
Ketones, ur: NEGATIVE mg/dL
Leukocytes, UA: NEGATIVE
NITRITE: NEGATIVE
Protein, ur: NEGATIVE mg/dL
SPECIFIC GRAVITY, URINE: 1 — AB (ref 1.005–1.030)
pH: 6 (ref 5.0–8.0)

## 2017-07-16 LAB — RAPID URINE DRUG SCREEN, HOSP PERFORMED
AMPHETAMINES: NOT DETECTED
BENZODIAZEPINES: NOT DETECTED
Barbiturates: NOT DETECTED
COCAINE: NOT DETECTED
OPIATES: NOT DETECTED
TETRAHYDROCANNABINOL: NOT DETECTED

## 2017-07-16 LAB — ETHANOL: Alcohol, Ethyl (B): <10 mg/dL (ref <10)

## 2017-07-16 LAB — COMPREHENSIVE METABOLIC PANEL
ALK PHOS: 177 U/L — AB (ref 38–126)
ALT: 26 U/L (ref 17–63)
AST: 52 U/L — ABNORMAL HIGH (ref 15–41)
Albumin: 3.7 g/dL (ref 3.5–5.0)
Anion gap: 11 (ref 5–15)
BILIRUBIN TOTAL: 2.2 mg/dL — AB (ref 0.3–1.2)
BUN: 7 mg/dL (ref 6–20)
CO2: 21 mmol/L — AB (ref 22–32)
Calcium: 9.6 mg/dL (ref 8.9–10.3)
Chloride: 105 mmol/L (ref 101–111)
Creatinine, Ser: 1.02 mg/dL (ref 0.61–1.24)
Glucose, Bld: 116 mg/dL — ABNORMAL HIGH (ref 65–99)
Potassium: 3.9 mmol/L (ref 3.5–5.1)
SODIUM: 137 mmol/L (ref 135–145)
Total Protein: 7.9 g/dL (ref 6.5–8.1)

## 2017-07-16 LAB — AMMONIA: AMMONIA: 34 umol/L (ref 9–35)

## 2017-07-16 LAB — CBG MONITORING, ED: Glucose-Capillary: 130 mg/dL — ABNORMAL HIGH (ref 65–99)

## 2017-07-16 LAB — CBC
HCT: 42.6 % (ref 39.0–52.0)
Hemoglobin: 14.7 g/dL (ref 13.0–17.0)
MCH: 33 pg (ref 26.0–34.0)
MCHC: 34.5 g/dL (ref 30.0–36.0)
MCV: 95.7 fL (ref 78.0–100.0)
PLATELETS: 96 10*3/uL — AB (ref 150–400)
RBC: 4.45 MIL/uL (ref 4.22–5.81)
RDW: 15.3 % (ref 11.5–15.5)
WBC: 6.9 10*3/uL (ref 4.0–10.5)

## 2017-07-16 LAB — PROTIME-INR
INR: 1.38
Prothrombin Time: 16.9 seconds — ABNORMAL HIGH (ref 11.4–15.2)

## 2017-07-16 MED ORDER — ACETAMINOPHEN 650 MG RE SUPP: 650.0000 mg | Freq: Four times a day (QID) | RECTAL | Status: DC | PRN

## 2017-07-16 MED ORDER — ACETAMINOPHEN 325 MG PO TABS
650.0000 mg | ORAL_TABLET | Freq: Four times a day (QID) | ORAL | Status: DC | PRN
  Administered 2017-07-20 – 2017-07-23 (×4): 650 mg via ORAL
  Filled 2017-07-16 (×4): qty 2

## 2017-07-16 MED ORDER — DICLOFENAC SODIUM 75 MG PO TBEC
75.0000 mg | DELAYED_RELEASE_TABLET | Freq: Two times a day (BID) | ORAL | Status: DC
  Administered 2017-07-16: 75 mg via ORAL
  Filled 2017-07-16 (×2): qty 1

## 2017-07-16 MED ORDER — ADULT MULTIVITAMIN W/MINERALS CH
1.0000 | ORAL_TABLET | Freq: Every day | ORAL | Status: DC
  Administered 2017-07-18 – 2017-07-24 (×6): 1 via ORAL
  Filled 2017-07-16 (×6): qty 1

## 2017-07-16 MED ORDER — VITAMIN B-1 100 MG PO TABS
100.0000 mg | ORAL_TABLET | Freq: Every day | ORAL | Status: DC
  Administered 2017-07-16 – 2017-07-24 (×7): 100 mg via ORAL
  Filled 2017-07-16 (×7): qty 1

## 2017-07-16 MED ORDER — AMITRIPTYLINE HCL 25 MG PO TABS
50.0000 mg | ORAL_TABLET | Freq: Every day | ORAL | Status: DC
  Administered 2017-07-16 – 2017-07-23 (×7): 50 mg via ORAL
  Filled 2017-07-16 (×7): qty 2

## 2017-07-16 MED ORDER — ENSURE ENLIVE PO LIQD
237.0000 mL | Freq: Two times a day (BID) | ORAL | Status: DC
  Administered 2017-07-18 – 2017-07-22 (×6): 237 mL via ORAL

## 2017-07-16 MED ORDER — PANTOPRAZOLE SODIUM 40 MG PO TBEC
40.0000 mg | DELAYED_RELEASE_TABLET | Freq: Every day | ORAL | Status: DC
  Administered 2017-07-18 – 2017-07-24 (×6): 40 mg via ORAL
  Filled 2017-07-16 (×6): qty 1

## 2017-07-16 MED ORDER — THIAMINE HCL 100 MG/ML IJ SOLN
100.0000 mg | Freq: Every day | INTRAMUSCULAR | Status: DC
  Administered 2017-07-17 – 2017-07-19 (×2): 100 mg via INTRAVENOUS
  Filled 2017-07-16 (×3): qty 2

## 2017-07-16 MED ORDER — ONDANSETRON HCL 4 MG PO TABS
4.0000 mg | ORAL_TABLET | Freq: Four times a day (QID) | ORAL | Status: DC | PRN
  Administered 2017-07-20: 4 mg via ORAL
  Filled 2017-07-16: qty 1

## 2017-07-16 MED ORDER — LACTULOSE 10 GM/15ML PO SOLN
10.0000 g | Freq: Three times a day (TID) | ORAL | Status: DC
  Administered 2017-07-16 – 2017-07-22 (×7): 10 g via ORAL
  Filled 2017-07-16: qty 15
  Filled 2017-07-16 (×2): qty 30
  Filled 2017-07-16 (×4): qty 15
  Filled 2017-07-16: qty 30
  Filled 2017-07-16: qty 15
  Filled 2017-07-16: qty 30

## 2017-07-16 MED ORDER — FOLIC ACID 1 MG PO TABS
1.0000 mg | ORAL_TABLET | Freq: Every day | ORAL | Status: DC
Start: 2017-07-16 — End: 2017-07-24
  Administered 2017-07-16 – 2017-07-24 (×7): 1 mg via ORAL
  Filled 2017-07-16 (×7): qty 1

## 2017-07-16 MED ORDER — ONDANSETRON HCL 4 MG/2ML IJ SOLN: 4.0000 mg | Freq: Four times a day (QID) | INTRAMUSCULAR | Status: DC | PRN

## 2017-07-16 MED ORDER — LORAZEPAM 1 MG PO TABS
1.0000 mg | ORAL_TABLET | Freq: Four times a day (QID) | ORAL | Status: DC | PRN
  Administered 2017-07-17: 1 mg via ORAL
  Filled 2017-07-16: qty 1

## 2017-07-16 MED ORDER — LORAZEPAM 2 MG/ML IJ SOLN: 1.0000 mg | Freq: Four times a day (QID) | INTRAMUSCULAR | Status: DC | PRN

## 2017-07-16 MED ORDER — ZOLPIDEM TARTRATE 5 MG PO TABS
5.0000 mg | ORAL_TABLET | Freq: Every evening | ORAL | Status: DC | PRN
  Administered 2017-07-17: 5 mg via ORAL
  Filled 2017-07-16: qty 1

## 2017-07-16 MED ORDER — NADOLOL 20 MG PO TABS
20.0000 mg | ORAL_TABLET | Freq: Every day | ORAL | Status: DC
  Administered 2017-07-18 – 2017-07-24 (×6): 20 mg via ORAL
  Filled 2017-07-16 (×7): qty 1

## 2017-07-16 NOTE — ED Provider Notes (Signed)
[facility] EMERGENCY DEPARTMENT Provider Note   CSN: [id number] Arrival date & time: 07/16/17  1124     History   Chief Complaint Chief Complaint  Patient presents with  . Altered Mental Status    HPI Michael Mcfarland is a 52 y.o. male.  Patient with history of alcoholic cirrhosis, elevated ammonia, hepatic encephalopathy who presents to the ED with change of his mental status.  Patient is with brother-in-law who states that patient has been acting confused and slowly over the last several days.  He has had multiple admissions for hepatic encephalopathy and is on lactulose.  Patient has no abdominal pain.  Family concerned that he is getting his hands on alcohol and benzodiazepines and other medications like his Fioricet.  Patient possibly with a fall several days ago.  According to family member patient is not taking his lactulose.  Patient has no abdominal pain.  Has never had a paracentesis.  The history is provided by the patient and a caregiver.  Illness  This is a recurrent problem. The current episode started more than 2 days ago. The problem occurs constantly. The problem has been gradually worsening. Pertinent negatives include no chest pain, no abdominal pain, no headaches and no shortness of breath. Nothing aggravates the symptoms. Nothing relieves the symptoms. He has tried nothing for the symptoms. The treatment provided no relief.    Past Medical History:  Diagnosis Date  . Alcohol abuse    /notes 01/18/2017  . Alcohol related seizure ([place]) 01/17/2017   Archie Endo 01/17/2017  . Anxiety   . Arthritis   . Cirrhosis, alcoholic ([place]) 06/8887  . Depression   . Emphysema lung ([place])   . Frequent headaches   . GERD (gastroesophageal reflux disease)   . Hay fever   . Migraine    frequency "depends on the weather" (01/19/2017)  . Panic attacks   . Pneumonia   . Seizure ([place]) 05/20/2017 X 2  . Stroke [facility])     Patient Active Problem List   Diagnosis  Date Noted  . Confusion 07/16/2017  . SDH (subdural hematoma) ([place]) 07/16/2017  . Insomnia 07/08/2017  . Low back pain 07/08/2017  . Esophageal varices in alcoholic cirrhosis ([place])   . Tobacco abuse 06/15/2017  . Hepatic encephalopathy ([place]) 06/15/2017  . Weakness 06/03/2017  . Gait abnormality 06/03/2017  . Anxiety 06/03/2017  . Seizure ([place])   . Cirrhosis ([place])   . Thrombocytopenia ([place])   . Alcohol withdrawal seizure ([place]) 01/17/2017    Past Surgical History:  Procedure Laterality Date  . ANTERIOR CERVICAL DECOMP/DISCECTOMY FUSION    . BACK SURGERY    . ESOPHAGOGASTRODUODENOSCOPY N/A 06/18/2017   Procedure: ESOPHAGOGASTRODUODENOSCOPY (EGD);  Surgeon: Doran Stabler, MD;  Location: [facility];  Service: Gastroenterology;  Laterality: N/A;        Home Medications    Prior to Admission medications   Medication Sig Start Date End Date Taking? Authorizing Provider  amitriptyline (ELAVIL) 50 MG tablet Take 1 tablet (50 mg total) by mouth at bedtime. 07/08/17  Yes [name], MD  butalbital-acetaminophen-caffeine [facility]) [phone] MG tablet Take 1-2 tablets by mouth daily as needed for headache. 07/08/17  Yes [name], MD  diclofenac (VOLTAREN) 75 MG EC tablet Take 1 tablet (75 mg total) by mouth 2 (two) times daily. 07/08/17  Yes [name], MD  feeding supplement, ENSURE ENLIVE, (ENSURE ENLIVE) LIQD Take 237 mLs by mouth 2 (two) times daily between meals. 06/18/17  Yes Irene Pap N, DO  folic acid (FOLVITE) 706 MCG tablet Take 400-800 mcg by mouth daily.   Yes [provider]  lactulose (CHRONULAC) 10 GM/15ML solution Take 66mL one to three times daily to titrate for 2-3 soft stools per day Patient taking differently: Take 3.33-10 g by mouth daily.  07/09/17  Yes [name], MD  nadolol (CORGARD) 20 MG tablet Take 1 tablet (20 mg total) by mouth daily. 07/08/17  Yes [name], MD  pantoprazole (PROTONIX) 40 MG tablet Take 1 tablet  (40 mg total) by mouth daily at 12 noon. 07/08/17  Yes [name], MD  thiamine 100 MG tablet Take 1 tablet (100 mg total) by mouth daily. 06/03/17  Yes [name], MD  zolpidem (AMBIEN) 5 MG tablet Take 5 mg by mouth at bedtime as needed for sleep.   Yes [provider]  folic acid (FOLVITE) 1 MG tablet Take 1 tablet (1 mg total) by mouth daily. Patient not taking: Reported on 07/16/2017 06/03/17   [name], MD  nicotine (NICODERM CQ - DOSED IN MG/24 HOURS) 21 mg/24hr patch Place 1 patch (21 mg total) onto the skin daily. Patient not taking: Reported on 07/16/2017 06/18/17   Kayleen Memos, DO    Family History Family History  Problem Relation Age of Onset  . Diabetes Mother   . Depression Mother   . Hypertension Mother   . Arthritis Mother   . Arthritis Father   . Heart disease Father   . Heart disease Brother   . Diabetes Sister     Social History Social History   Tobacco Use  . Smoking status: Current Every Day Smoker    Packs/day: 0.25    Years: 35.00    Pack years: 8.75    Types: Cigarettes    Last attempt to quit: 12/28/2016    Years since quitting: 0.5  . Smokeless tobacco: Never Used  . Tobacco comment: quit smoking after admission 04/2017  Substance Use Topics  . Alcohol use: Not Currently    Alcohol/week: 10.8 oz    Types: 18 Cans of beer per week    Comment: until admission 04/2017 drinking 12 to 18 beers daily.  since then, no beer or other ETOH (as of 06/17/17)  . Drug use: Not Currently    Comment: 05/20/2017 "been a long time; I tried different things when I was younger"     Allergies   Patient has no known allergies.   Review of Systems Review of Systems  Constitutional: Negative for chills and fever.  HENT: Negative for ear pain and sore throat.   Eyes: Negative for pain and visual disturbance.  Respiratory: Negative for cough and shortness of breath.   Cardiovascular: Negative for chest pain and palpitations.  Gastrointestinal:  Negative for abdominal pain and vomiting.  Genitourinary: Negative for dysuria and hematuria.  Musculoskeletal: Negative for arthralgias and back pain.  Skin: Negative for color change and rash.  Neurological: Negative for dizziness, tremors, seizures, syncope, facial asymmetry, speech difficulty, weakness, light-headedness, numbness and headaches.  Psychiatric/Behavioral: Positive for confusion.  All other systems reviewed and are negative.    Physical Exam Updated Vital Signs  ED Triage Vitals  Enc Vitals Group     BP 07/16/17 1128 (!) 146/89     Pulse Rate 07/16/17 1128 97     Resp 07/16/17 1128 16     Temp 07/16/17 1128 98.7 F (37.1 C)     Temp Source  07/16/17 1128 Oral     SpO2 07/16/17 1128 97 %     Weight 07/16/17 1127 218 lb (98.9 kg)     Height 07/16/17 1127 6' (1.829 m)     Head Circumference --      Peak Flow --      Pain Score 07/16/17 1141 5     Pain Loc --      Pain Edu? --      Excl. in [place]? --     Physical Exam  Constitutional: He is oriented to person, place, and time. He appears well-developed and well-nourished. No distress.  HENT:  Head: Normocephalic and atraumatic.  Right Ear: External ear normal.  Left Ear: External ear normal.  Mouth/Throat: No oropharyngeal exudate.  Eyes: Pupils are equal, round, and reactive to light. Conjunctivae are normal. Scleral icterus is present.  Neck: Normal range of motion. Neck supple.  Cardiovascular: Normal rate, regular rhythm, normal heart sounds and intact distal pulses.  No murmur heard. Pulmonary/Chest: Effort normal and breath sounds normal. No respiratory distress.  Abdominal: Soft. Bowel sounds are normal. There is no tenderness.  Musculoskeletal: Normal range of motion. He exhibits no edema, tenderness or deformity.  Neurological: He is alert and oriented to person, place, and time. No cranial nerve deficit or sensory deficit. He exhibits normal muscle tone. Coordination normal.  5+/5 muscle strength  throughout and normal sensation throughout.  Normal gait.  No drift, normal finger to nose finger.  Skin: Skin is warm and dry.  Psychiatric: He has a normal mood and affect.  Patient with some confusion on questioning  Nursing note and vitals reviewed.    ED Treatments / Results  Labs (all labs ordered are listed, but only abnormal results are displayed) Labs Reviewed  COMPREHENSIVE METABOLIC PANEL - Abnormal; Notable for the following components:      Result Value   CO2 21 (*)    Glucose, Bld 116 (*)    AST 52 (*)    Alkaline Phosphatase 177 (*)    Total Bilirubin 2.2 (*)    All other components within normal limits  CBC - Abnormal; Notable for the following components:   Platelets 96 (*)    All other components within normal limits  PROTIME-INR - Abnormal; Notable for the following components:   Prothrombin Time 16.9 (*)    All other components within normal limits  URINALYSIS, ROUTINE W REFLEX MICROSCOPIC - Abnormal; Notable for the following components:   Color, Urine STRAW (*)    Specific Gravity, Urine 1.000 (*)    All other components within normal limits  CBG MONITORING, ED - Abnormal; Notable for the following components:   Glucose-Capillary 130 (*)    All other components within normal limits  AMMONIA  ETHANOL  RAPID URINE DRUG SCREEN, HOSP PERFORMED  CBC  COMPREHENSIVE METABOLIC PANEL    EKG EKG Interpretation  Date/Time:  Friday July 16 2017 11:30:12 EDT Ventricular Rate:  97 PR Interval:  172 QRS Duration: 86 QT Interval:  356 QTC Calculation: 452 R Axis:   -53 Text Interpretation:  Normal sinus rhythm Possible Left atrial enlargement Left axis deviation Anteroseptal infarct , age undetermined Abnormal ECG Confirmed by Thayer Jew [phone]) on 07/16/2017 8:10:42 PM   Radiology Ct Head Wo Contrast  Result Date: 07/16/2017 CLINICAL DATA:  Patient fell in bathroom this morning. Increasing confusion. EXAM: CT HEAD WITHOUT CONTRAST TECHNIQUE:  Contiguous axial images were obtained from the base of the skull through the vertex without intravenous contrast.  COMPARISON:  CT 06/15/2017 FINDINGS: Brain: Tiny left 7 x 3 x 6 mm parafalcine subdural hematoma. No intraparenchymal hemorrhage, edema or midline shift. Chronic mild small vessel ischemic disease of periventricular white matter. No large vascular territory infarct. Midline fourth ventricle and basal cisterns. Vascular: No hyperdense vessel sign. Atherosclerosis of the cavernous carotids and both vertebral arteries. Skull: No acute skull fracture or suspicious osseous lesions. Sinuses/Orbits: Mild ethmoid, bilateral maxillary and frontal sinus mucosal thickening. Clear sphenoid sinuses and mastoids. Intact orbits and globes. Other: None IMPRESSION: 1. Acute 7 x 3 x 6 mm left parafalcine subdural hematoma. 2. Mild chronic small vessel ischemic disease of periventricular white matter. These results were called by telephone at the time of interpretation on 07/16/2017 at 8:41 pm to Dr. Lennice Sites , who verbally acknowledged these results. Electronically Signed   By: Ashley Royalty M.D.   On: 07/16/2017 20:41    Procedures Procedures (including critical care time)  Medications Ordered in ED Medications  amitriptyline (ELAVIL) tablet 50 mg (has no administration in time range)  feeding supplement (ENSURE ENLIVE) (ENSURE ENLIVE) liquid 237 mL (has no administration in time range)  diclofenac (VOLTAREN) EC tablet 75 mg (has no administration in time range)  nadolol (CORGARD) tablet 20 mg (has no administration in time range)  pantoprazole (PROTONIX) EC tablet 40 mg (has no administration in time range)  zolpidem (AMBIEN) tablet 5 mg (has no administration in time range)  LORazepam (ATIVAN) tablet 1 mg (has no administration in time range)    Or  LORazepam (ATIVAN) injection 1 mg (has no administration in time range)  thiamine (VITAMIN B-1) tablet 100 mg (has no administration in time range)     Or  thiamine (B-1) injection 100 mg (has no administration in time range)  folic acid (FOLVITE) tablet 1 mg (has no administration in time range)  multivitamin with minerals tablet 1 tablet (has no administration in time range)  lactulose (CHRONULAC) 10 GM/15ML solution 10 g (has no administration in time range)  acetaminophen (TYLENOL) tablet 650 mg (has no administration in time range)    Or  acetaminophen (TYLENOL) suppository 650 mg (has no administration in time range)  ondansetron (ZOFRAN) tablet 4 mg (has no administration in time range)    Or  ondansetron (ZOFRAN) injection 4 mg (has no administration in time range)     Initial Impression / Assessment and Plan / ED Course  I have reviewed the triage vital signs and the nursing notes.  Pertinent labs & imaging results that were available during my care of the patient were reviewed by me and considered in my medical decision making (see chart for details).     Michael Mcfarland is a 52 year old male with history of alcoholic cirrhosis, hepatic encephalopathy who presents to the ED with altered mental status.  Patient with normal vitals.  No fever.  Family concerned as he has began more confused over the last several days.  Patient is only taking his lactulose once a day and a concern for elevated ammonia.  He has had multiple recent admissions for the same.  Patient has never had a paracentesis.  Patient has no abdominal pain.  Doubt SBP as patient has no tenderness on exam.  No fever.  Patient possibly had a fall several days ago may be today.  Family concerned because patient has been intermittently drinking alcohol and possibly getting benzodiazepines from friends.  Patient also possibly overusing his Fioricet as well.  Patient overall is well-appearing.  He  has some scleral icterus.  No abdominal pain.  Has a normal neurological exam.  Upon questioning patient does appear confused and has some inappropriate answers to questions.  Concern  for metabolic encephalopathy likely from elevated ammonia.  However, will obtain basic labs, ammonia level, ETOH, UDS, alcohol level, head CT, U/a To further evaluate.  Patient with ammonia within normal limits.  Patient with normal alcohol level and negative UDS.  Head CT shows small subdural hematoma.  Patient otherwise with normal urinalysis.  No significant leukocytosis, electrolyte abnormality, acute kidney injury.  Liver enzymes elevated at baseline.  Neurosurgery was consulted and will come down to the ED to evaluate the patient.  Possible patient also with mild withdrawal symptoms as well as appears to have access to benzodiazepines on a regular basis. Hospitalist consulted and to admit the patient for further care.  Final Clinical Impressions(s) / ED Diagnoses   Final diagnoses:  SDH (subdural hematoma) (HCC)  Altered mental status, unspecified altered mental status type    ED Discharge Orders    None       Lennice Sites, DO 07/16/17 2343    Tanna Furry, MD 07/25/17 1539

## 2017-07-16 NOTE — ED Notes (Signed)
Paged [employer]

## 2017-07-16 NOTE — ED Triage Notes (Signed)
Wife stated, his ammonia level is elevated, he fell in bathroom this morning , more and more confusion. His doctor, Jodi Mourning stated, if I can't handle him to bring him here.

## 2017-07-16 NOTE — ED Notes (Signed)
ED provider at bedside.

## 2017-07-16 NOTE — H&P (Signed)
History and Physical    Michael Mcfarland EHU:[id number] DOB: 02-13-1966 DOA: 07/16/2017  PCP: [name], MD  Patient coming from: Home  I have personally briefly reviewed patient's old medical records in [place]  Chief Complaint: AMS  HPI: Michael Mcfarland is a 52 y.o. male with medical history significant of EtOH cirrhosis, EtOH abuse, GERD, migraines.  Patient recently admitted to our service last month with encephalopathy.  Believed to be hetpatic encephalopathy.  Ammonia initially in the 50s, got lactulose, improved to 30s and patients mental status improved.  Recd SNF but patient didn't want to pay so went home with home health.  Since then it seems that patient resumed drinking last week as well as use of Xanax.  Family concerned that he is getting his hands on alcohol and benzodiazepines and other medications like his Fioricet.  Family removed all the pill bottles from the house on Sunday after he fell and hit his head.  Lives with sister.  For the last few days has had increasing mild confusion. Had tremors over past couple of days but not today. Today had severe confusion and is talking nonsense.  Brought in to ED by family.   ED Course: EtOH neg.  UDS neg.  Ammonia upper end of normal.  CT head shows small acute para falcine SDH   Review of Systems: As per HPI otherwise 10 point review of systems negative.   Past Medical History:  Diagnosis Date  . Alcohol abuse    /notes 01/18/2017  . Alcohol related seizure ([place]) 01/17/2017   Archie Endo 01/17/2017  . Anxiety   . Arthritis   . Cirrhosis, alcoholic ([place]) 78/5885  . Depression   . Emphysema lung ([place])   . Frequent headaches   . GERD (gastroesophageal reflux disease)   . Hay fever   . Migraine    frequency "depends on the weather" (01/19/2017)  . Panic attacks   . Pneumonia   . Seizure ([place]) 05/20/2017 X 2  . Stroke [facility])     Past Surgical History:  Procedure Laterality Date  . ANTERIOR CERVICAL  DECOMP/DISCECTOMY FUSION    . BACK SURGERY    . ESOPHAGOGASTRODUODENOSCOPY N/A 06/18/2017   Procedure: ESOPHAGOGASTRODUODENOSCOPY (EGD);  Surgeon: Doran Stabler, MD;  Location: [facility];  Service: Gastroenterology;  Laterality: N/A;     reports that he has been smoking cigarettes.  He has a 8.75 pack-year smoking history. He has never used smokeless tobacco. He reports that he drank about 10.8 oz of alcohol per week. He reports that he has current or past drug history.  No Known Allergies  Family History  Problem Relation Age of Onset  . Diabetes Mother   . Depression Mother   . Hypertension Mother   . Arthritis Mother   . Arthritis Father   . Heart disease Father   . Heart disease Brother   . Diabetes Sister      Prior to Admission medications   Medication Sig Start Date End Date Taking? Authorizing Provider  amitriptyline (ELAVIL) 50 MG tablet Take 1 tablet (50 mg total) by mouth at bedtime. 07/08/17  Yes [name], MD  butalbital-acetaminophen-caffeine [facility]) [phone] MG tablet Take 1-2 tablets by mouth daily as needed for headache. 07/08/17  Yes [name], MD  diclofenac (VOLTAREN) 75 MG EC tablet Take 1 tablet (75 mg total) by mouth 2 (two) times daily. 07/08/17  Yes [name], MD  feeding supplement, ENSURE ENLIVE, (ENSURE  ENLIVE) LIQD Take 237 mLs by mouth 2 (two) times daily between meals. 06/18/17  Yes Kayleen Memos, DO  folic acid (FOLVITE) 154 MCG tablet Take 400-800 mcg by mouth daily.   Yes [provider]  lactulose (CHRONULAC) 10 GM/15ML solution Take 84mL one to three times daily to titrate for 2-3 soft stools per day Patient taking differently: Take 3.33-10 g by mouth daily.  07/09/17  Yes [name], MD  nadolol (CORGARD) 20 MG tablet Take 1 tablet (20 mg total) by mouth daily. 07/08/17  Yes [name], MD  pantoprazole (PROTONIX) 40 MG tablet Take 1 tablet (40 mg total) by mouth daily at 12 noon. 07/08/17  Yes  [name], MD  thiamine 100 MG tablet Take 1 tablet (100 mg total) by mouth daily. 06/03/17  Yes [name], MD  zolpidem (AMBIEN) 5 MG tablet Take 5 mg by mouth at bedtime as needed for sleep.   Yes [provider]  folic acid (FOLVITE) 1 MG tablet Take 1 tablet (1 mg total) by mouth daily. Patient not taking: Reported on 07/16/2017 06/03/17   [name], MD  nicotine (NICODERM CQ - DOSED IN MG/24 HOURS) 21 mg/24hr patch Place 1 patch (21 mg total) onto the skin daily. Patient not taking: Reported on 07/16/2017 06/18/17   Kayleen Memos, DO    Physical Exam: Vitals:   07/16/17 2145 07/16/17 2215 07/16/17 2305 07/16/17 2313  BP: (!) 174/150 (!) 148/87 (!) 142/90   Pulse: 98 77 68   Resp: 18 17 18    Temp:    98.8 F (37.1 C)  TempSrc:      SpO2: 95% 96% 97%   Weight:      Height:        Constitutional: NAD, calm, comfortable Eyes: PERRL, lids and conjunctivae normal ENMT: Mucous membranes are moist. Posterior pharynx clear of any exudate or lesions.Normal dentition.  Neck: normal, supple, no masses, no thyromegaly Respiratory: clear to auscultation bilaterally, no wheezing, no crackles. Normal respiratory effort. No accessory muscle use.  Cardiovascular: Regular rate and rhythm, no murmurs / rubs / gallops. No extremity edema. 2+ pedal pulses. No carotid bruits.  Abdomen: no tenderness, no masses palpated. No hepatosplenomegaly. Bowel sounds positive.  Musculoskeletal: no clubbing / cyanosis. No joint deformity upper and lower extremities. Good ROM, no contractures. Normal muscle tone.  Skin: no rashes, lesions, ulcers. No induration Neurologic: CN 2-12 grossly intact. Sensation intact, DTR normal. Strength 5/5 in all 4. Ambulating to bathroom without assistance. Psychiatric: Talking nonsense words in answer to questions.   Labs on Admission: I have personally reviewed following labs and imaging studies  CBC: Recent Labs  Lab 07/16/17 1156  WBC 6.9  HGB  14.7  HCT 42.6  MCV 95.7  PLT 96*   Basic Metabolic Panel: Recent Labs  Lab 07/16/17 1156  NA 137  K 3.9  CL 105  CO2 21*  GLUCOSE 116*  BUN 7  CREATININE 1.02  CALCIUM 9.6   GFR: Estimated Creatinine Clearance: 102 mL/min (by C-G formula based on SCr of 1.02 mg/dL). Liver Function Tests: Recent Labs  Lab 07/16/17 1156  AST 52*  ALT 26  ALKPHOS 177*  BILITOT 2.2*  PROT 7.9  ALBUMIN 3.7   No results for input(s): LIPASE, AMYLASE in the last 168 hours. Recent Labs  Lab 07/16/17 1825  AMMONIA 34   Coagulation Profile: Recent Labs  Lab 07/16/17 1825  INR 1.38   Cardiac Enzymes: No results  for input(s): CKTOTAL, CKMB, CKMBINDEX, TROPONINI in the last 168 hours. BNP (last 3 results) No results for input(s): PROBNP in the last 8760 hours. HbA1C: No results for input(s): HGBA1C in the last 72 hours. CBG: Recent Labs  Lab 07/16/17 1150  GLUCAP 130*   Lipid Profile: No results for input(s): CHOL, HDL, LDLCALC, TRIG, CHOLHDL, LDLDIRECT in the last 72 hours. Thyroid Function Tests: No results for input(s): TSH, T4TOTAL, FREET4, T3FREE, THYROIDAB in the last 72 hours. Anemia Panel: No results for input(s): VITAMINB12, FOLATE, FERRITIN, TIBC, IRON, RETICCTPCT in the last 72 hours. Urine analysis:    Component Value Date/Time   COLORURINE STRAW (A) 07/16/2017 1958   APPEARANCEUR CLEAR 07/16/2017 1958   LABSPEC 1.000 (L) 07/16/2017 1958   PHURINE 6.0 07/16/2017 1958   GLUCOSEU NEGATIVE 07/16/2017 1958   HGBUR NEGATIVE 07/16/2017 1958   BILIRUBINUR NEGATIVE 07/16/2017 [place] NEGATIVE 07/16/2017 1958   PROTEINUR NEGATIVE 07/16/2017 1958   NITRITE NEGATIVE 07/16/2017 1958   LEUKOCYTESUR NEGATIVE 07/16/2017 1958    Radiological Exams on Admission: Ct Head Wo Contrast  Result Date: 07/16/2017 CLINICAL DATA:  Patient fell in bathroom this morning. Increasing confusion. EXAM: CT HEAD WITHOUT CONTRAST TECHNIQUE: Contiguous axial images were obtained  from the base of the skull through the vertex without intravenous contrast. COMPARISON:  CT 06/15/2017 FINDINGS: Brain: Tiny left 7 x 3 x 6 mm parafalcine subdural hematoma. No intraparenchymal hemorrhage, edema or midline shift. Chronic mild small vessel ischemic disease of periventricular white matter. No large vascular territory infarct. Midline fourth ventricle and basal cisterns. Vascular: No hyperdense vessel sign. Atherosclerosis of the cavernous carotids and both vertebral arteries. Skull: No acute skull fracture or suspicious osseous lesions. Sinuses/Orbits: Mild ethmoid, bilateral maxillary and frontal sinus mucosal thickening. Clear sphenoid sinuses and mastoids. Intact orbits and globes. Other: None IMPRESSION: 1. Acute 7 x 3 x 6 mm left parafalcine subdural hematoma. 2. Mild chronic small vessel ischemic disease of periventricular white matter. These results were called by telephone at the time of interpretation on 07/16/2017 at 8:41 pm to Dr. Lennice Sites , who verbally acknowledged these results. Electronically Signed   By: Ashley Royalty M.D.   On: 07/16/2017 20:41    EKG: Independently reviewed.  Assessment/Plan Principal Problem:   SDH (subdural hematoma) (HCC) Active Problems:   Cirrhosis (HCC)   Confusion    1. SDH - 1. NS coming to ED to eval 2. Will admit to SDU for now 3. Ordered repeat CT head at 0800 tomorrow 4. Tele monitor 2. Confusion - 1. DDX includes: SDH, withdrawal, intoxication, wernicke encephalopathy, hepatic encephalopathy. 2. SDH management as above 3. CIWA including thiamine and folate first dose now (though I doubt wernicke encephalopathy with recent admits and thiamine use). 4. Will put on Lactulose 10mg  TID (was taking it ~once daily at home).  But ammonia not elevated this time. 3. Cirrhosis - 1. Due to prior EtOH use, chronic and stable.  DVT prophylaxis: SCDs due to SDH Code Status: Full Family Communication: Family at bedside Disposition Plan:  Home after admit Consults called: EDP called NS Admission status: Admit to inpatient   [place], [place] [facility] Pager [phone]  If 7AM-7PM, please contact day team taking care of patient [email or web address] Password [facility]  07/16/2017, 11:27 PM

## 2017-07-16 NOTE — ED Notes (Signed)
Pt walked to bathroom x1 assist

## 2017-07-16 NOTE — Progress Notes (Signed)
BP (!) 142/90   Pulse 68   Temp 98.8 F (37.1 C)   Resp 18   Ht 6' (1.829 m)   Wt 98.9 kg (218 lb)   SpO2 97%   BMI 29.57 kg/m   Films reviewed. If this is blood in the left parafalcine area it is of no importance whatsoever. This needs no followup, nor should it be considered an etiology for any mental status changes. Basal cisterns widely patent, no shift, ventricles are not effaced.

## 2017-07-16 NOTE — Consult Note (Signed)
Michael Mcfarland is a 52 y.o. male Whom fell this morning in his bathroom. Pmh significant for cirrhosis, elevated ammonia at this time. CT showed blood in the cranial vault. I was consulted for surgical evaluation. No Known Allergies Past Medical History:  Diagnosis Date  . Alcohol abuse    /notes 01/18/2017  . Alcohol related seizure ([place]) 01/17/2017   Archie Endo 01/17/2017  . Anxiety   . Arthritis   . Cirrhosis, alcoholic ([place]) 59/9357  . Depression   . Emphysema lung ([place])   . Frequent headaches   . GERD (gastroesophageal reflux disease)   . Hay fever   . Migraine    frequency "depends on the weather" (01/19/2017)  . Panic attacks   . Pneumonia   . Seizure ([place]) 05/20/2017 X 2  . Stroke [facility])    Past Surgical History:  Procedure Laterality Date  . ANTERIOR CERVICAL DECOMP/DISCECTOMY FUSION    . BACK SURGERY    . ESOPHAGOGASTRODUODENOSCOPY N/A 06/18/2017   Procedure: ESOPHAGOGASTRODUODENOSCOPY (EGD);  Surgeon: Doran Stabler, MD;  Location: [place];  Service: Gastroenterology;  Laterality: N/A;   Family History  Problem Relation Age of Onset  . Diabetes Mother   . Depression Mother   . Hypertension Mother   . Arthritis Mother   . Arthritis Father   . Heart disease Father   . Heart disease Brother   . Diabetes Sister    Social History   Socioeconomic History  . Marital status: Widowed    Spouse name: Not on file  . Number of children: Not on file  . Years of education: Not on file  . Highest education level: Not on file  Occupational History  . Occupation: Dealer    Comment: self employed but stopped working ~ 06-17-2009 following death of wife and after having neck surgery.    Social Needs  . Financial resource strain: Not on file  . Food insecurity:    Worry: Not on file    Inability: Not on file  . Transportation needs:    Medical: Not on file    Non-medical: Not on file  Tobacco Use  . Smoking status: Current Every Day Smoker    Packs/day: 0.25   Years: 35.00    Pack years: 8.75    Types: Cigarettes    Last attempt to quit: 12/28/2016    Years since quitting: 0.5  . Smokeless tobacco: Never Used  . Tobacco comment: quit smoking after admission 04/2017  Substance and Sexual Activity  . Alcohol use: Not Currently    Alcohol/week: 10.8 oz    Types: 18 Cans of beer per week    Comment: until admission 04/2017 drinking 12 to 18 beers daily.  since then, no beer or other ETOH (as of 06/17/17)  . Drug use: Not Currently    Comment: 05/20/2017 "been a long time; I tried different things when I was younger"  . Sexual activity: Not Currently    Comment: widowed in 06/17/09  Lifestyle  . Physical activity:    Days per week: Not on file    Minutes per session: Not on file  . Stress: Not on file  Relationships  . Social connections:    Talks on phone: Not on file    Gets together: Not on file    Attends religious service: Not on file    Active member of club or organization: Not on file    Attends meetings of clubs or organizations: Not on file  Relationship status: Not on file  . Intimate partner violence:    Fear of current or ex partner: Not on file    Emotionally abused: Not on file    Physically abused: Not on file    Forced sexual activity: Not on file  Other Topics Concern  . Not on file  Social History Narrative  . Not on file   Physical Exam  Constitutional: He appears well-developed and well-nourished. No distress.  HENT:  Head: Normocephalic.  Eyes: Pupils are equal, round, and reactive to light. Conjunctivae and EOM are normal.  Neck: Normal range of motion. Neck supple.  Cardiovascular: Normal rate and regular rhythm.  Pulmonary/Chest: Effort normal and breath sounds normal.  Musculoskeletal: Normal range of motion.  Neurological: He is alert.  Confused, did not know year, place, situation Did not follow some commands, speech is fluent, often non sensical Moving all extremities well Cannot perform detailed sensory  evaluation Getting out of the bed in the ED repeatedly Not combative  Skin: Skin is warm and dry.  Ct Head Wo Contrast  Result Date: 07/16/2017 CLINICAL DATA:  Patient fell in bathroom this morning. Increasing confusion. EXAM: CT HEAD WITHOUT CONTRAST TECHNIQUE: Contiguous axial images were obtained from the base of the skull through the vertex without intravenous contrast. COMPARISON:  CT 06/15/2017 FINDINGS: Brain: Tiny left 7 x 3 x 6 mm parafalcine subdural hematoma. No intraparenchymal hemorrhage, edema or midline shift. Chronic mild small vessel ischemic disease of periventricular white matter. No large vascular territory infarct. Midline fourth ventricle and basal cisterns. Vascular: No hyperdense vessel sign. Atherosclerosis of the cavernous carotids and both vertebral arteries. Skull: No acute skull fracture or suspicious osseous lesions. Sinuses/Orbits: Mild ethmoid, bilateral maxillary and frontal sinus mucosal thickening. Clear sphenoid sinuses and mastoids. Intact orbits and globes. Other: None IMPRESSION: 1. Acute 7 x 3 x 6 mm left parafalcine subdural hematoma. 2. Mild chronic small vessel ischemic disease of periventricular white matter. These results were called by telephone at the time of interpretation on 07/16/2017 at 8:41 pm to Dr. Lennice Sites , who verbally acknowledged these results. Electronically Signed   By: Ashley Royalty M.D.   On: 07/16/2017 20:41   A/P Michael Mcfarland is a 52 y.o. male Whom fell in the shower this morning. Wife stated she could not handle him at home. Head CT showed a tiny parafalcine subdural, or simple hyperdensity. There are no surgical indications. There is no reason to repeat the scan for this abnormality. Will sign off, believe confusion due to metabolic derangement.

## 2017-07-17 ENCOUNTER — Inpatient Hospital Stay (HOSPITAL_COMMUNITY): Payer: Self-pay

## 2017-07-17 DIAGNOSIS — F1910 Other psychoactive substance abuse, uncomplicated: Secondary | ICD-10-CM

## 2017-07-17 DIAGNOSIS — G92 Toxic encephalopathy: Secondary | ICD-10-CM

## 2017-07-17 LAB — COMPREHENSIVE METABOLIC PANEL
ALT: 21 U/L (ref 17–63)
ANION GAP: 10 (ref 5–15)
AST: 47 U/L — ABNORMAL HIGH (ref 15–41)
Albumin: 3.4 g/dL — ABNORMAL LOW (ref 3.5–5.0)
Alkaline Phosphatase: 146 U/L — ABNORMAL HIGH (ref 38–126)
BUN: 10 mg/dL (ref 6–20)
CHLORIDE: 105 mmol/L (ref 101–111)
CO2: 21 mmol/L — ABNORMAL LOW (ref 22–32)
CREATININE: 1.05 mg/dL (ref 0.61–1.24)
Calcium: 9 mg/dL (ref 8.9–10.3)
Glucose, Bld: 92 mg/dL (ref 65–99)
POTASSIUM: 3.9 mmol/L (ref 3.5–5.1)
Sodium: 136 mmol/L (ref 135–145)
Total Bilirubin: 2.8 mg/dL — ABNORMAL HIGH (ref 0.3–1.2)
Total Protein: 7 g/dL (ref 6.5–8.1)

## 2017-07-17 LAB — CBC
HCT: 39.9 % (ref 39.0–52.0)
Hemoglobin: 13.5 g/dL (ref 13.0–17.0)
MCH: 32.5 pg (ref 26.0–34.0)
MCHC: 33.8 g/dL (ref 30.0–36.0)
MCV: 96.1 fL (ref 78.0–100.0)
Platelets: 90 10*3/uL — ABNORMAL LOW (ref 150–400)
RBC: 4.15 MIL/uL — ABNORMAL LOW (ref 4.22–5.81)
RDW: 15.3 % (ref 11.5–15.5)
WBC: 6.6 10*3/uL (ref 4.0–10.5)

## 2017-07-17 LAB — MRSA PCR SCREENING: MRSA BY PCR: NEGATIVE

## 2017-07-17 MED ORDER — NICOTINE 21 MG/24HR TD PT24: 21.0000 mg | MEDICATED_PATCH | Freq: Every day | TRANSDERMAL | Status: DC

## 2017-07-17 MED ORDER — SODIUM CHLORIDE 0.9 % IV SOLN
INTRAVENOUS | Status: DC
  Administered 2017-07-17 – 2017-07-21 (×5): via INTRAVENOUS
  Administered 2017-07-22: 50 mL via INTRAVENOUS

## 2017-07-17 MED ORDER — LORAZEPAM 2 MG/ML IJ SOLN
2.0000 mg | INTRAMUSCULAR | Status: DC | PRN
  Administered 2017-07-17 (×3): 3 mg via INTRAVENOUS
  Administered 2017-07-17: 2 mg via INTRAVENOUS
  Administered 2017-07-18 (×4): 3 mg via INTRAVENOUS
  Administered 2017-07-18: 2 mg via INTRAVENOUS
  Administered 2017-07-18: 3 mg via INTRAVENOUS
  Filled 2017-07-17 (×5): qty 2
  Filled 2017-07-17: qty 1
  Filled 2017-07-17: qty 2
  Filled 2017-07-17: qty 1
  Filled 2017-07-17 (×3): qty 2

## 2017-07-17 NOTE — Progress Notes (Signed)
PROGRESS NOTE   Michael Mcfarland  QTM:[id number]    DOB: Jan 16, 1966    DOA: 07/16/2017  PCP: [name], MD   I have briefly reviewed patients previous medical records in [facility].  Brief Narrative:  52 year old male, lives with his sister, independent, PMH of alcohol dependence complicated by seizures, cirrhosis, esophageal varices & thrombocytopenia, anxiety, depression, emphysema, GERD, stroke, tobacco abuse, Xanax abuse, hospitalized 3/19-3/26 for acute hepatic encephalopathy, MRI brain unrevealing at that time, EGD showed grade 2 varices, SNF recommended but discharged home, since then apparently resumed drinking alcohol as well as use of Xanax.  Family concerned that he is getting his hands on alcohol and benzodiazepines and other medications such as Fioricet and removed the pill bottles from his house on Sunday PTA after he fell and hit his head.  Now presented to ED on 4/19 with increasing confusion, tremors.  Admitted for acute toxic metabolic encephalopathy, small subdural hematoma.  Neurosurgery consulted.   Assessment & Plan:   Principal Problem:   SDH (subdural hematoma) (HCC) Active Problems:   Cirrhosis (HCC)   Confusion   Subdural hematoma: As per neurosurgery, no surgical indications.  Repeat head CT 4/20 shows reduction in size.  Less likely for this to be because of his mental status changes.  Neurosurgery signed off.  Avoid heparin for DVT prophylaxis.  Acute toxic metabolic encephalopathy: CT head as above.  UA negative for UTI features.  UDS negative.  BAL <10.  Ammonia level 34.  Metabolic parameters look unremarkable.  Unclear etiology.?  Substance withdrawal.  If mental status does not start to improve may consider EEG but probably will be low yield.  Delirium precautions. CIWA protocol.  Nicotine patch.  Safety sitter if needed.  Alcoholic cirrhosis complicated by esophageal varices, thrombocytopenia, coagulopathy: Less likely to be hepatic encephalopathy.   However continue lactulose 10 g 3 times daily and target for 3 BMs per day.  Continue nadolol.  Continue CIWA and thiamine.  Minimize sedating medications.  Chronic thrombocytopenia: Stable.  No bleeding reported.  History of alcohol withdrawal seizures: No seizure-like activity reported.  Seizure precautions and monitor for seizures.  Anxiety and depression: Remains on amitriptyline.  GERD: Continue PPI.  Polysubstance abuse (alcohol, tobacco, Xanax, opioids): Ongoing issue.   DVT prophylaxis: SCD Code Status: Full Family Communication: Discussed with patient step son at bedside.   Disposition: To be determined.   Consultants:  Neurosurgery-signed off  Procedures:  None  Antimicrobials:  None   Subjective: Patient seen this morning.  Stepson at bedside.  Patient examined along with RN at bedside.  Joslyn Hy is not very close to patient and limited history available.  Patient lives with his sister.  Issues with ongoing alcohol, Xanax abuse.  Apparent fall x2 in the week prior to admission and the last one was the day before admission.  Overnight events noted: Confused, verbally & physically aggressive, fall risk, auditory and visual hallucinations.  Apparently resting and went to sleep about 6:30 AM today.  ROS: As above  Objective:  Vitals:   07/17/17 0200 07/17/17 0500 07/17/17 0655 07/17/17 0800  BP: (!) 156/91 (!) 114/93 123/84 (!) 127/92  Pulse: 74 (!) 105 91 95  Resp: 14 20 16 15   Temp:      TempSrc:      SpO2: 99% 100% 93% 92%  Weight:      Height:        Examination:  General exam: Middle-aged male, moderately built and overweight, lying comfortably supine  in bed.  Does not appear in any distress. Respiratory system: Clear to auscultation. Respiratory effort normal. Cardiovascular system: S1 & S2 heard, RRR. No JVD, murmurs, rubs, gallops or clicks. No pedal edema.  Telemetry personally reviewed: Sinus rhythm. Gastrointestinal system: Abdomen is  nondistended, soft and nontender. No organomegaly or masses felt. Normal bowel sounds heard.  Patient has Posey belt on. Central nervous system: Sleeping, difficulty arouse, will briefly move his limbs to touch or call. No focal neurological deficits. Extremities: Spontaneously moves all limbs.  Hand mittens in place. Skin: No rashes, lesions or ulcers Psychiatry: Judgement and insight unable to assess. Mood & affect unable to assess.     Data Reviewed: I have personally reviewed following labs and imaging studies  CBC: Recent Labs  Lab 07/16/17 1156 07/17/17 0851  WBC 6.9 6.6  HGB 14.7 13.5  HCT 42.6 39.9  MCV 95.7 96.1  PLT 96* 90*   Basic Metabolic Panel: Recent Labs  Lab 07/16/17 1156 07/17/17 0851  NA 137 136  K 3.9 3.9  CL 105 105  CO2 21* 21*  GLUCOSE 116* 92  BUN 7 10  CREATININE 1.02 1.05  CALCIUM 9.6 9.0   Liver Function Tests: Recent Labs  Lab 07/16/17 1156 07/17/17 0851  AST 52* 47*  ALT 26 21  ALKPHOS 177* 146*  BILITOT 2.2* 2.8*  PROT 7.9 7.0  ALBUMIN 3.7 3.4*   Coagulation Profile: Recent Labs  Lab 07/16/17 1825  INR 1.38   CBG: Recent Labs  Lab 07/16/17 1150  GLUCAP 130*    Recent Results (from the past 240 hour(s))  MRSA PCR Screening     Status: None   Collection Time: 07/17/17 12:35 AM  Result Value Ref Range Status   MRSA by PCR NEGATIVE NEGATIVE Final    Comment:        The GeneXpert MRSA Assay (FDA approved for NASAL specimens only), is one component of a comprehensive MRSA colonization surveillance program. It is not intended to diagnose MRSA infection nor to guide or monitor treatment for MRSA infections. Performed at [facility], [place] [address] [place], [place] [address]          Radiology Studies: Ct Head Wo Contrast  Result Date: 07/17/2017 CLINICAL DATA:  Follow-up intracranial hemorrhage. EXAM: CT HEAD WITHOUT CONTRAST TECHNIQUE: Contiguous axial images were obtained from the base of the skull  through the vertex without intravenous contrast. COMPARISON:  07/16/2017 FINDINGS: Brain: Subcentimeter focus of acute extra-axial hemorrhage in the left interhemispheric fissure, posterior frontal region, has slightly decreased in size and density and may be subdural or subarachnoid. No intracranial hemorrhage is identified elsewhere. There is no evidence of acute infarct, mass/mass effect, or significant extra-axial fluid collection. There is minimal cerebral atrophy. Periventricular white matter T2 hyperintensities are nonspecific but compatible with minimal chronic small vessel ischemic disease. Vascular: Calcified atherosclerosis at the skull base. Skull: No fracture or focal osseous lesion. Sinuses/Orbits: Moderate bilateral ethmoid air cell mucosal thickening. Mild mucosal thickening in the frontal and maxillary sinuses. No sinus fluid level. Clear mastoid air cells. Unremarkable orbits. Other: None. IMPRESSION: 1. Slightly decreased size of focal subcentimeter extra-axial hemorrhage in the left interhemispheric fissure. 2. No evidence of new intracranial abnormality. Electronically Signed   By: Logan Bores M.D.   On: 07/17/2017 08:48   Ct Head Wo Contrast  Result Date: 07/16/2017 CLINICAL DATA:  Patient fell in bathroom this morning. Increasing confusion. EXAM: CT HEAD WITHOUT CONTRAST TECHNIQUE: Contiguous axial images were obtained from  the base of the skull through the vertex without intravenous contrast. COMPARISON:  CT 06/15/2017 FINDINGS: Brain: Tiny left 7 x 3 x 6 mm parafalcine subdural hematoma. No intraparenchymal hemorrhage, edema or midline shift. Chronic mild small vessel ischemic disease of periventricular white matter. No large vascular territory infarct. Midline fourth ventricle and basal cisterns. Vascular: No hyperdense vessel sign. Atherosclerosis of the cavernous carotids and both vertebral arteries. Skull: No acute skull fracture or suspicious osseous lesions. Sinuses/Orbits: Mild  ethmoid, bilateral maxillary and frontal sinus mucosal thickening. Clear sphenoid sinuses and mastoids. Intact orbits and globes. Other: None IMPRESSION: 1. Acute 7 x 3 x 6 mm left parafalcine subdural hematoma. 2. Mild chronic small vessel ischemic disease of periventricular white matter. These results were called by telephone at the time of interpretation on 07/16/2017 at 8:41 pm to Dr. Lennice Sites , who verbally acknowledged these results. Electronically Signed   By: Ashley Royalty M.D.   On: 07/16/2017 20:41        Scheduled Meds: . amitriptyline  50 mg Oral QHS  . diclofenac  75 mg Oral BID  . feeding supplement (ENSURE ENLIVE)  237 mL Oral BID BM  . folic acid  1 mg Oral Daily  . lactulose  10 g Oral TID  . multivitamin with minerals  1 tablet Oral Daily  . nadolol  20 mg Oral Daily  . pantoprazole  40 mg Oral Q1200  . thiamine  100 mg Oral Daily   Or  . thiamine  100 mg Intravenous Daily   Continuous Infusions:   LOS: 1 day     Vernell Leep, MD, FACP, [facility]. [facility] Pager [phone]  If 7PM-7AM, please contact night-coverage [email or web address] Password [facility] 07/17/2017, 11:38 AM

## 2017-07-17 NOTE — Progress Notes (Signed)
0030- pt arrived to 4np09, walked from stretcher to bed, but became distracted despite redirection and walked into a corner of the room with assistance. Patient is pleasant, disoriented to time and situation. Eventually redirected patient back to bed where he was able to hold still long enough to be assessed but then promptly got up to use the bathroom. Patient ambulated to bathroom with assistance, needed a lot of redirection because he wanted to go into the hallway to urinate. Once in the bathroom patient did not urinate, but became distracted by and interacted with every item in the bathroom except the toilet and walked out after leaving the water running. Patient took a bottle or M9 air spray off the counter and almost sprayed it into his mouth but stopped when the RN told him to. Patient hallucinating that his O2 probe is a cigarette, is biting the telemonitoring wires, having full on conversations with people not present, and has tremors in his arms and legs; CIWA=27  0052- 1mg  ativan given PO per order.  0200-  An RN or tech has remained in patient's room r/t every time pt is alone he get out of bed unassisted despite education about safety and using the call bell. CIWA=27 despite ativan, patient becoming verbally and physically aggressive with staff. Patient almost fell trying to get out of bed but was caught by staff before leaning too far. MD text paged, received order for IV Ativan 2-3mg  PRN dependant on CIWA, administered 3mg  per order.  0242- Patient oriented to self only, is swinging and yelling at staff, has worsening auditory and visual hallucinations. MD text paged, received order for non-violent restraints. Placed patient in posey belt with mitts.  0342- Patient's visual and auditory hallucinations continuing (pretending to eat/chew), has been waving hands in the air and talking to himself for the last hour, CIWA=20. Administered 3mg  ativan per order.  0445- Patient swatted at phlebotomy  when they attempted to draw labs, patient moving too much, phlebotomy uncomfortable drawing labs at this time. Patient's mouth was dry; RN performed oral care for the patient and noticed some dried blood in his mouth and on his bottom lip. CIWA=21  0515- ativan administered per order  0530- CIWA=18

## 2017-07-18 ENCOUNTER — Other Ambulatory Visit: Payer: Self-pay

## 2017-07-18 DIAGNOSIS — F10232 Alcohol dependence with withdrawal with perceptual disturbance: Secondary | ICD-10-CM

## 2017-07-18 DIAGNOSIS — R4182 Altered mental status, unspecified: Secondary | ICD-10-CM

## 2017-07-18 DIAGNOSIS — F10932 Alcohol use, unspecified with withdrawal with perceptual disturbance: Secondary | ICD-10-CM | POA: Diagnosis present

## 2017-07-18 DIAGNOSIS — D696 Thrombocytopenia, unspecified: Secondary | ICD-10-CM

## 2017-07-18 DIAGNOSIS — F1310 Sedative, hypnotic or anxiolytic abuse, uncomplicated: Secondary | ICD-10-CM | POA: Diagnosis not present

## 2017-07-18 DIAGNOSIS — R410 Disorientation, unspecified: Secondary | ICD-10-CM

## 2017-07-18 LAB — GLUCOSE, CAPILLARY: GLUCOSE-CAPILLARY: 77 mg/dL (ref 65–99)

## 2017-07-18 MED ORDER — DEXMEDETOMIDINE HCL IN NACL 200 MCG/50ML IV SOLN
0.4000 ug/kg/h | INTRAVENOUS | Status: DC
  Administered 2017-07-18: 1.2 ug/kg/h via INTRAVENOUS
  Administered 2017-07-18: 0.4 ug/kg/h via INTRAVENOUS
  Administered 2017-07-18: 0.9 ug/kg/h via INTRAVENOUS
  Administered 2017-07-19 (×2): 0.7 ug/kg/h via INTRAVENOUS
  Administered 2017-07-19 (×2): 0.8 ug/kg/h via INTRAVENOUS
  Administered 2017-07-19: 0.6 ug/kg/h via INTRAVENOUS
  Administered 2017-07-19: 0.7 ug/kg/h via INTRAVENOUS
  Administered 2017-07-19: 0.5 ug/kg/h via INTRAVENOUS
  Administered 2017-07-20: 0.8 ug/kg/h via INTRAVENOUS
  Administered 2017-07-20: 0.4 ug/kg/h via INTRAVENOUS
  Administered 2017-07-20: 0.7 ug/kg/h via INTRAVENOUS
  Filled 2017-07-18 (×14): qty 50

## 2017-07-18 MED ORDER — PHENOBARBITAL SODIUM 65 MG/ML IJ SOLN
260.0000 mg | INTRAMUSCULAR | Status: DC | PRN
Start: 2017-07-18 — End: 2017-07-19

## 2017-07-18 MED ORDER — HALOPERIDOL LACTATE 5 MG/ML IJ SOLN: 5.0000 mg | Freq: Four times a day (QID) | INTRAMUSCULAR | Status: DC | PRN

## 2017-07-18 MED ORDER — PHENOBARBITAL SODIUM 65 MG/ML IJ SOLN: 130.0000 mg | INTRAMUSCULAR | Status: DC | PRN

## 2017-07-18 MED ORDER — ORAL CARE MOUTH RINSE
15.0000 mL | Freq: Two times a day (BID) | OROMUCOSAL | Status: DC
  Administered 2017-07-18 – 2017-07-19 (×2): 15 mL via OROMUCOSAL

## 2017-07-18 MED ORDER — HALOPERIDOL LACTATE 5 MG/ML IJ SOLN
INTRAMUSCULAR | Status: AC
  Administered 2017-07-18: 5 mg
  Filled 2017-07-18: qty 1

## 2017-07-18 MED ORDER — HALOPERIDOL LACTATE 5 MG/ML IJ SOLN
5.0000 mg | Freq: Once | INTRAMUSCULAR | Status: AC
Start: 2017-07-18 — End: 2017-07-18

## 2017-07-18 NOTE — Consult Note (Addendum)
Initial Pulmonary/Critical Care Consultation  Patient Name: Michael Mcfarland MRN: [id number] DOB: 1965-11-23    ADMISSION DATE:  07/16/2017 CONSULTATION DATE:  07/18/2017  REFERRING MD:  Dr. Vernell Leep  REASON FOR CONSULTATION:  Withdrawal syndrome   HISTORY OF PRESENT ILLNESS  This 52 y.o. Caucasian male smoker is seen in consultation at the request of Dr. Vernell Leep for recommendations on further evaluation and management of withdrawal syndrome, associated with hallucinations and delirium. The patient admits to chronic alcohol and Xanax abuse. He was admitted on 07/16/2017 after family and friends had concerns about altered mental status, associated with frequent falls. He was hospitalized last month with hepatic encephalopathy, but his serum ammonia levels have been normal during this admission. He did have a small subdural hematoma discovered during this admission, but this is not believed to be the cause of his psychomotor agitation. During this hospitalization, the patient has been on CIWA protocol and received multiple large doses of Ativan with minimal therapeutic benefit. Again, he has a well-known history of Xanax abuse. At the time of clinical interview, he is uncooperative. While non-combative, he is unable to keep still and is at high risk of self-inflicted injury and is complicating attempts at treatment. He has needed physical restraints which have been of limited effect and now raise concern for additional risk of self-inflicted injury. We are consulted for consideration of use of more potent agents, potentially requiring transfer to an ICU setting.  He lives with sister. His step-son is at the bedside and reports that the patient basically constantly consumes alcohol and abuses "pills".  REVIEW OF SYSTEMS Constitutional: No weight loss. No night sweats. No fever. No chills. No fatigue. HEENT: No headaches, dysphagia, sore throat, otalgia, nasal congestion, PND CV:  No  chest pain, orthopnea, PND, swelling in lower extremities, palpitations GI:  No abdominal pain, nausea, vomiting, diarrhea, change in bowel pattern, anorexia Resp: No DOE, rest dyspnea, cough, mucus, hemoptysis, wheezing  GU: no dysuria, change in color of urine, no urgency or frequency.  No flank pain. MS:  Back pain. No joint pain or swelling. No myalgias,  No decreased range of motion.  Psych:  No change in mood or affect. No memory loss. Skin: no rash or lesions.   PAST MEDICAL/SURGICAL/SOCIAL/FAMILY HISTORIES   Past Medical History:  Diagnosis Date  . Alcohol abuse    /notes 01/18/2017  . Alcohol related seizure ([place]) 01/17/2017   Archie Endo 01/17/2017  . Anxiety   . Arthritis   . Cirrhosis, alcoholic ([place]) 62/2297  . Depression   . Emphysema lung ([place])   . Frequent headaches   . GERD (gastroesophageal reflux disease)   . Hay fever   . Migraine    frequency "depends on the weather" (01/19/2017)  . Panic attacks   . Pneumonia   . Seizure ([place]) 05/20/2017 X 2  . Stroke [facility])     Past Surgical History:  Procedure Laterality Date  . ANTERIOR CERVICAL DECOMP/DISCECTOMY FUSION    . BACK SURGERY    . ESOPHAGOGASTRODUODENOSCOPY N/A 06/18/2017   Procedure: ESOPHAGOGASTRODUODENOSCOPY (EGD);  Surgeon: Doran Stabler, MD;  Location: [place];  Service: Gastroenterology;  Laterality: N/A;    Social History   Tobacco Use  . Smoking status: Current Every Day Smoker    Packs/day: 0.25    Years: 35.00    Pack years: 8.75    Types: Cigarettes    Last attempt to quit: 12/28/2016    Years since quitting: 0.5  . Smokeless tobacco:  Never Used  . Tobacco comment: quit smoking after admission 04/2017  Substance Use Topics  . Alcohol use: Not Currently    Alcohol/week: 10.8 oz    Types: 18 Cans of beer per week    Comment: until admission 04/2017 drinking 12 to 18 beers daily.  since then, no beer or other ETOH (as of 06/17/17)    Family History  Problem Relation Age of Onset    . Diabetes Mother   . Depression Mother   . Hypertension Mother   . Arthritis Mother   . Arthritis Father   . Heart disease Father   . Heart disease Brother   . Diabetes Sister     No Known Allergies   Prior to Admission medications   Medication Sig Start Date End Date Taking? Authorizing Provider  amitriptyline (ELAVIL) 50 MG tablet Take 1 tablet (50 mg total) by mouth at bedtime. 07/08/17  Yes [name], MD  butalbital-acetaminophen-caffeine [facility], [facility]) [phone] MG tablet Take 1-2 tablets by mouth daily as needed for headache. 07/08/17  Yes [name], MD  diclofenac (VOLTAREN) 75 MG EC tablet Take 1 tablet (75 mg total) by mouth 2 (two) times daily. 07/08/17  Yes [name], MD  feeding supplement, ENSURE ENLIVE, (ENSURE ENLIVE) LIQD Take 237 mLs by mouth 2 (two) times daily between meals. 06/18/17  Yes Kayleen Memos, DO  folic acid (FOLVITE) 355 MCG tablet Take 400-800 mcg by mouth daily.   Yes [provider]  lactulose (CHRONULAC) 10 GM/15ML solution Take 54mL one to three times daily to titrate for 2-3 soft stools per day Patient taking differently: Take 3.33-10 g by mouth daily.  07/09/17  Yes [name], MD  nadolol (CORGARD) 20 MG tablet Take 1 tablet (20 mg total) by mouth daily. 07/08/17  Yes [name], MD  pantoprazole (PROTONIX) 40 MG tablet Take 1 tablet (40 mg total) by mouth daily at 12 noon. 07/08/17  Yes [name], MD  thiamine 100 MG tablet Take 1 tablet (100 mg total) by mouth daily. 06/03/17  Yes [name], MD  zolpidem (AMBIEN) 5 MG tablet Take 5 mg by mouth at bedtime as needed for sleep.   Yes [provider]  folic acid (FOLVITE) 1 MG tablet Take 1 tablet (1 mg total) by mouth daily. Patient not taking: Reported on 07/16/2017 06/03/17   [name], MD  nicotine (NICODERM CQ - DOSED IN MG/24 HOURS) 21 mg/24hr patch Place 1 patch (21 mg total) onto the skin daily. Patient not taking: Reported on 07/16/2017  06/18/17   Kayleen Memos, DO    Current Facility-Administered Medications  Medication Dose Route Frequency Provider Last Rate Last Dose  . 0.9 %  sodium chloride infusion   Intravenous Continuous Modena Jansky, MD 50 mL/hr at 07/17/17 1226    . acetaminophen (TYLENOL) tablet 650 mg  650 mg Oral Q6H PRN Etta Quill, DO       Or  . acetaminophen (TYLENOL) suppository 650 mg  650 mg Rectal Q6H PRN Etta Quill, DO      . amitriptyline (ELAVIL) tablet 50 mg  50 mg Oral QHS Jennette Kettle M, DO   50 mg at 07/17/17 2216  . feeding supplement (ENSURE ENLIVE) (ENSURE ENLIVE) liquid 237 mL  237 mL Oral BID BM Etta Quill, DO      . folic acid (FOLVITE) tablet 1 mg  1 mg Oral Daily Etta Quill, DO  1 mg at 07/16/17 2347  . lactulose (CHRONULAC) 10 GM/15ML solution 10 g  10 g Oral TID Etta Quill, DO   10 g at 07/17/17 2218  . LORazepam (ATIVAN) injection 2-3 mg  2-3 mg Intravenous Q1H PRN Vertis Kelch, NP   3 mg at 07/18/17 6789  . multivitamin with minerals tablet 1 tablet  1 tablet Oral Daily Alcario Drought, Jared M, DO      . nadolol (CORGARD) tablet 20 mg  20 mg Oral Daily Alcario Drought, Jared M, DO      . ondansetron [facility]) tablet 4 mg  4 mg Oral Q6H PRN Etta Quill, DO       Or  . ondansetron [facility]) injection 4 mg  4 mg Intravenous Q6H PRN Etta Quill, DO      . pantoprazole (PROTONIX) EC tablet 40 mg  40 mg Oral Q1200 Etta Quill, DO      . thiamine (VITAMIN B-1) tablet 100 mg  100 mg Oral Daily Jennette Kettle M, DO   100 mg at 07/16/17 2347   Or  . thiamine (B-1) injection 100 mg  100 mg Intravenous Daily Jennette Kettle M, DO   100 mg at 07/17/17 1036     VITAL SIGNS: BP (!) 153/94 (BP Location: Left Arm)   Pulse 77   Temp 98.7 F (37.1 C) (Oral)   Resp 14   Ht 6' (1.829 m)   Wt 98.9 kg (218 lb)   SpO2 93%   BMI 29.57 kg/m   INTAKE / OUTPUT: I/O last 3 completed shifts: In: 418.3 [P.O.:240; I.V.:178.3] Out: 300 [Urine:300]  PHYSICAL  EXAMINATION: GENERAL: anxious, agitated, confused, uncooperative, inappropriate safety awareness. HEAD: normocephalic, atraumatic EYE: PERRLA, EOM intact, no scleral icterus, no pallor. NOSE: Rhinophyma. Nares are patent. No polyps. No exudate. THROAT/ORAL CAVITY: Normal dentition. No oral thrush. No exudate. Mucous membranes are moist. No tonsillar enlargement.  NECK: supple, no thyromegaly, no JVD, no lymphadenopathy. Trachea midline. CHEST/LUNG: symmetric in development and expansion. Good air entry. Scant crackles. No wheezes. HEART: Regular S1 and S2 without murmur, rub or gallop. ABDOMEN: soft, nontender, nondistended. Normoactive bowel sounds. No rebound. No guarding. EXTREMITIES: Edema: none. No cyanosis. No 3clubbing. 2+ DP pulses LYMPHATIC: no cervical/axiallary/inguinal lymph nodes appreciated MUSCULOSKELETAL: No point tenderness. No bulk atrophy. Joints: normal.  SKIN:  No rash or lesion. NEUROLOGIC: Visual hallucinations (multiple people). Cranial nerves II-XII are grossly symmetric and physiologic. Babinski absent. No sensory deficit. Motor: 5/5 @ RUE, 5/5 @ LUE, 5/5 @ RLL,  5/5 @ LLL.  DTR: 2+ @ R biceps, 2+ @ L biceps, 2+ @ R patellar,  2+ @ L patellar. No asterixis. No cerebellar signs. Gait was not assessed.   LABS:  ABG No results for input(s): PHART, PCO2ART, PO2ART in the last 168 hours.  Cardiac Enzymes No results for input(s): TROPONINI, PROBNP in the last 168 hours.  BASIC METABOLIC PROFILE Recent Labs  Lab 07/16/17 1156 07/17/17 0851  NA 137 136  K 3.9 3.9  CL 105 105  CO2 21* 21*  BUN 7 10  CREATININE 1.02 1.05  GLUCOSE 116* 92  CALCIUM 9.6 9.0    Glucose Recent Labs  Lab 07/16/17 1150  GLUCAP 130*    Liver Enzymes Recent Labs  Lab 07/16/17 1156 07/17/17 0851  AST 52* 47*  ALT 26 21  ALKPHOS 177* 146*  BILITOT 2.2* 2.8*  ALBUMIN 3.7 3.4*    CBC Recent Labs  Lab 07/16/17 1156 07/17/17 0851  WBC  6.9 6.6  HGB 14.7 13.5  HCT 42.6  39.9  PLT 96* 90*    COAGULATION STUDIES Recent Labs  Lab 07/16/17 1825  INR 1.38    SEPSIS MARKERS No results for input(s): LATICACIDVEN, PROCALCITON, O2SATVEN in the last 168 hours.  CULTURES: Results for orders placed or performed during the hospital encounter of 07/16/17  MRSA PCR Screening     Status: None   Collection Time: 07/17/17 12:35 AM  Result Value Ref Range Status   MRSA by PCR NEGATIVE NEGATIVE Final    Comment:        The GeneXpert MRSA Assay (FDA approved for NASAL specimens only), is one component of a comprehensive MRSA colonization surveillance program. It is not intended to diagnose MRSA infection nor to guide or monitor treatment for MRSA infections. Performed at [facility], [place] [address] [place], [place] [address]     IMAGING: CT head (4/20): Slightly decreased size of focal subcentimeter extra-axial hemorrhage in the left interhemispheric fissure. No evidence of new intracranial abnormality.   SIGNIFICANT EVENTS: 4/19: admitted with concerns for falls and altered mental status: withdrawal syndrome. Small SDH discovered, radiographically improving. 4/21: consulted for difficulties in management on CIWA protocol.   ASSESSMENT / PLAN: Principal Problem:   SDH (subdural hematoma) (HCC) Active Problems:   Confusion   Alcohol withdrawal syndrome with perceptual disturbance (HCC)   Cirrhosis (HCC)   Thrombocytopenia (HCC)   Benzodiazepine abuse, episodic (HCC)   NEUROLOGIC  Alcohol withdrawal syndrome with hallucinations  Concurrent benzodiazepine withdrawal Transfer to ICU in anticipation of starting Precedex gtt. Discontinuing benzodiazepines and starting phenobarbital may need to be considered. Continue CIWA protocol for now. Add haloperidol 5 mg IV q 6 hr. I have spoken to the son about the potential role/need for endotracheal intubation both for airway protection and even as an intervention of potential respiratory  suppression. He is the next of kin and, consequently, in the position of responsibility to make healthcare decisions if and when the patient is incapacitated. He expressed that the patient wishes to be a DNR (no CPR, no chest compressions, no DC cardioversion) but would be agreeable to intubation in this context. The patient voiced agreement.  GASTROINTESTINAL  Alcoholic liver cirrhosis  HEMATOLOGIC  Thrombocytopenia, likely related to chronic liver disease Monitor platelets   FAMILY  - Updates:  Step-son at bedside, updated.  CODE STATUS: LIMITED (intubation OK, no ACLS)  Renee Pain, MD Board Certified by the ABIM, [place] Pager: [phone]  07/18/2017, 9:21 AM  ADDENDUM [phone]): Patient is transferred to 54M in the interim.  Started on Precedex which is running at 1.2 mcg/kg/min.  Continues to be combative and uncooperative.  No perceivable therapeutic benefit from continued Ativan administration from [facility] protocol.  We will start phenobarbital.  In light of benzodiazepine saphenous exposure, will not provide a phenobarbital load.  Continue to assess using the CIWA scale every 30 minutes: phenobarbital 260 mg IV for CIWA score > 15; phenobarbital 130 mg IV for CIWA score > 8-15.  In light of his recent history of benzodiazepine administration and abuse, will need to monitor respiratory status closely.  Renee Pain, MD

## 2017-07-18 NOTE — Progress Notes (Signed)
PROGRESS NOTE   Michael Mcfarland  WUJ:[id number]    DOB: Oct 19, 1965    DOA: 07/16/2017  PCP: [name], MD   I have briefly reviewed patients previous medical records in [facility].  Brief Narrative:  52 year old male, lives with his sister, independent, PMH of alcohol dependence complicated by seizures, cirrhosis, esophageal varices & thrombocytopenia, anxiety, depression, emphysema, GERD, stroke, tobacco abuse, Xanax abuse, hospitalized 3/19-3/26 for acute hepatic encephalopathy, MRI brain unrevealing at that time, EGD showed grade 2 varices, SNF recommended but discharged home, since then apparently resumed drinking alcohol as well as use of Xanax.  Family concerned that he is getting his hands on alcohol and benzodiazepines and other medications such as Fioricet and removed the pill bottles from his house on Sunday PTA after he fell and hit his head.  Now presented to ED on 4/19 with increasing confusion, tremors.  Admitted for acute toxic metabolic encephalopathy, small subdural hematoma.  Neurosurgery consulted and signed off.  Ongoing withdrawal syndrome not adequately managed on current CIWA regimen. CCM altered 4/21, patient transferring to ICU for Precedex drip and higher level of care needs.   Assessment & Plan:   Principal Problem:   SDH (subdural hematoma) (HCC) Active Problems:   Cirrhosis (HCC)   Thrombocytopenia (HCC)   Confusion   Alcohol withdrawal syndrome with perceptual disturbance (HCC)   Benzodiazepine abuse, episodic (HCC)   Subdural hematoma: As per neurosurgery, no surgical indications.  Repeat head CT 4/20 shows reduction in size.  Less likely for this to be because of his mental status changes.  Neurosurgery signed off.  Avoid heparin for DVT prophylaxis.  No change.  Acute toxic metabolic encephalopathy: CT head as above.  UA negative for UTI features.  UDS negative.  BAL <10.  Ammonia level 34.  Metabolic parameters look unremarkable.  Unclear  etiology.?  Substance withdrawal. Delirium precautions. CIWA protocol.  Nicotine patch.  Safety sitter placed last night.  Also has Posey belt and mittens.  Overnight 4/20, agitation, aggressiveness, ongoing confusion, hallucinations despite multiple doses of IV Ativan.  CCM consulted 4/21 and patient transferring to ICU for higher level of management of his withdrawal syndrome (possibly alcohol and benzodiazepine) and possibly starting Precedex drip.  He may need intubation for airway protection if he develops respiratory suppression.  Alcoholic cirrhosis complicated by esophageal varices, thrombocytopenia, coagulopathy: Less likely to be hepatic encephalopathy.  However continue lactulose 10 g 3 times daily and target for 3 BMs per day.  Continue nadolol.  Continue CIWA and thiamine.  Minimize sedating medications.  Chronic thrombocytopenia: Stable.  No bleeding reported.  History of alcohol withdrawal seizures: No seizure-like activity reported.  Seizure precautions and monitor for seizures.  Anxiety and depression: Remains on amitriptyline.  GERD: Continue PPI.  Polysubstance abuse (alcohol, tobacco, Xanax, opioids): Ongoing issue.   DVT prophylaxis: SCD Code Status: CCM discussed with family and changed CODE STATUS to partial code on 4/21 (no CPR, no chest compressions, no shocking but would like intubation and BiPAP). Family Communication: Discussed with patient step son at bedside, updated care and answered questions.   Disposition: On 4/21, consulted CCM and transferring to ICU.   Consultants:  Neurosurgery-signed off  Procedures:  None  Antimicrobials:  None   Subjective: Patient interviewed and examined with stepson and patient's RN at bedside.  As per RN, yesterday in the afternoon patient was pleasantly confused but not agitated as long as his family was around, ate well but overnight worsened with impulsiveness, restlessness, auditory  and visual hallucinations, decreased  sleep, agitation.  Had 3 BMs in the last 24 hours.  Currently oriented only to self, ongoing visual hallucinations, does not recognize his stepson at bedside, confused.  Slightly anxious and tremulous as well.  ROS: As above  Objective:  Vitals:   07/18/17 0349 07/18/17 0350 07/18/17 0800 07/18/17 1124  BP: (!) 153/94 (!) 153/94 (!) 157/104   Pulse: (!) 117 77 91   Resp:  14 19   Temp:  98.7 F (37.1 C) 98.4 F (36.9 C) 98.1 F (36.7 C)  TempSrc:  Oral Oral Oral  SpO2: 92% 93% 97%   Weight:      Height:        Examination:  General exam: Middle-aged male, moderately built and overweight, lying comfortably supine in bed.  Does not appear in any distress but appears anxious and fidgety in bed. Respiratory system: Clear to auscultation.  No increased work of breathing. Cardiovascular system: S1 and S2 heard, RRR.  No JVD, murmurs or pedal edema.  Telemetry: Sinus rhythm. Gastrointestinal system: Abdomen is nondistended, soft and nontender. No organomegaly or masses felt. Normal bowel sounds heard.  Patient has Posey belt on.  Stable. Central nervous system: Alert and oriented only to self.  Follows some instructions.  No focal deficits noted. Extremities: Spontaneously moves all limbs with seemingly normal power.  Hand mittens in place. Skin: No rashes, lesions or ulcers Psychiatry: Judgement and insight impaired. Mood & affect anxious.  Visual hallucinations.     Data Reviewed: I have personally reviewed following labs and imaging studies  CBC: Recent Labs  Lab 07/16/17 1156 07/17/17 0851  WBC 6.9 6.6  HGB 14.7 13.5  HCT 42.6 39.9  MCV 95.7 96.1  PLT 96* 90*   Basic Metabolic Panel: Recent Labs  Lab 07/16/17 1156 07/17/17 0851  NA 137 136  K 3.9 3.9  CL 105 105  CO2 21* 21*  GLUCOSE 116* 92  BUN 7 10  CREATININE 1.02 1.05  CALCIUM 9.6 9.0   Liver Function Tests: Recent Labs  Lab 07/16/17 1156 07/17/17 0851  AST 52* 47*  ALT 26 21  ALKPHOS 177* 146*    BILITOT 2.2* 2.8*  PROT 7.9 7.0  ALBUMIN 3.7 3.4*   Coagulation Profile: Recent Labs  Lab 07/16/17 1825  INR 1.38   CBG: Recent Labs  Lab 07/16/17 1150  GLUCAP 130*    Recent Results (from the past 240 hour(s))  MRSA PCR Screening     Status: None   Collection Time: 07/17/17 12:35 AM  Result Value Ref Range Status   MRSA by PCR NEGATIVE NEGATIVE Final    Comment:        The GeneXpert MRSA Assay (FDA approved for NASAL specimens only), is one component of a comprehensive MRSA colonization surveillance program. It is not intended to diagnose MRSA infection nor to guide or monitor treatment for MRSA infections. Performed at [facility], [place] [address] [place], [place] [address]          Radiology Studies: Ct Head Wo Contrast  Result Date: 07/17/2017 CLINICAL DATA:  Follow-up intracranial hemorrhage. EXAM: CT HEAD WITHOUT CONTRAST TECHNIQUE: Contiguous axial images were obtained from the base of the skull through the vertex without intravenous contrast. COMPARISON:  07/16/2017 FINDINGS: Brain: Subcentimeter focus of acute extra-axial hemorrhage in the left interhemispheric fissure, posterior frontal region, has slightly decreased in size and density and may be subdural or subarachnoid. No intracranial hemorrhage is identified elsewhere. There is no  evidence of acute infarct, mass/mass effect, or significant extra-axial fluid collection. There is minimal cerebral atrophy. Periventricular white matter T2 hyperintensities are nonspecific but compatible with minimal chronic small vessel ischemic disease. Vascular: Calcified atherosclerosis at the skull base. Skull: No fracture or focal osseous lesion. Sinuses/Orbits: Moderate bilateral ethmoid air cell mucosal thickening. Mild mucosal thickening in the frontal and maxillary sinuses. No sinus fluid level. Clear mastoid air cells. Unremarkable orbits. Other: None. IMPRESSION: 1. Slightly decreased size of focal  subcentimeter extra-axial hemorrhage in the left interhemispheric fissure. 2. No evidence of new intracranial abnormality. Electronically Signed   By: Logan Bores M.D.   On: 07/17/2017 08:48   Ct Head Wo Contrast  Result Date: 07/16/2017 CLINICAL DATA:  Patient fell in bathroom this morning. Increasing confusion. EXAM: CT HEAD WITHOUT CONTRAST TECHNIQUE: Contiguous axial images were obtained from the base of the skull through the vertex without intravenous contrast. COMPARISON:  CT 06/15/2017 FINDINGS: Brain: Tiny left 7 x 3 x 6 mm parafalcine subdural hematoma. No intraparenchymal hemorrhage, edema or midline shift. Chronic mild small vessel ischemic disease of periventricular white matter. No large vascular territory infarct. Midline fourth ventricle and basal cisterns. Vascular: No hyperdense vessel sign. Atherosclerosis of the cavernous carotids and both vertebral arteries. Skull: No acute skull fracture or suspicious osseous lesions. Sinuses/Orbits: Mild ethmoid, bilateral maxillary and frontal sinus mucosal thickening. Clear sphenoid sinuses and mastoids. Intact orbits and globes. Other: None IMPRESSION: 1. Acute 7 x 3 x 6 mm left parafalcine subdural hematoma. 2. Mild chronic small vessel ischemic disease of periventricular white matter. These results were called by telephone at the time of interpretation on 07/16/2017 at 8:41 pm to Dr. Lennice Sites , who verbally acknowledged these results. Electronically Signed   By: Ashley Royalty M.D.   On: 07/16/2017 20:41        Scheduled Meds: . amitriptyline  50 mg Oral QHS  . feeding supplement (ENSURE ENLIVE)  237 mL Oral BID BM  . folic acid  1 mg Oral Daily  . lactulose  10 g Oral TID  . multivitamin with minerals  1 tablet Oral Daily  . nadolol  20 mg Oral Daily  . pantoprazole  40 mg Oral Q1200  . thiamine  100 mg Oral Daily   Or  . thiamine  100 mg Intravenous Daily   Continuous Infusions: . sodium chloride 50 mL/hr at 07/17/17 1226  .  dexmedetomidine (PRECEDEX) IV infusion Stopped (07/18/17 1000)     LOS: 2 days     Vernell Leep, MD, FACP, [facility] Pager [phone]  If 7PM-7AM, please contact night-coverage [email or web address] Password [facility] 07/18/2017, 11:26 AM

## 2017-07-18 NOTE — Progress Notes (Signed)
1000- Pt with new orders for transfer to ICU  1730- pt transferred to ICU

## 2017-07-18 NOTE — Progress Notes (Signed)
2000- Patient confused but pleasant, still impulsive, but was less restless when family was in the room.   2200- Family left for the night, patient became very restless. Paged MD, received order for safety sitter.  0000- Even with sitter, patient still very impulsive though less restless. Leaving the Posey belt on because patient is so quick to hop out of the bed.  0200- Patient has had 1 large mushy and 2 small liquid BMs so far this shift.  0400- Patient hasn't slept more than 15 min at a time and is having an increase in auditory and visual hallucinations. Patient has gone from resting with his eyes closed one minute to flinging his legs over the side rail the next. CIWA= 15, administered Ativan per order.  0500- Patient's auditory and visual hallucinations continuing. Patient scooted as far to the end of his bed that the posey belt would allow, told RN and sitter "I will fight all of you" and was pulling at his telemetry leads and IV. CIWA=23, ativan administered per order and MD text paged per order.  0600- CIWA=25 Patient having hallucinations that are causing him to be very agitated. Patient reached out to RN's face, RN asked what he was doing and patient said "it's an ice pack, you said your arm was hurting" after RN had asked patient to straighten out his arm r/t his IV in his right AC. Ativan administered per order.

## 2017-07-19 LAB — COMPREHENSIVE METABOLIC PANEL
ALT: 25 U/L (ref 17–63)
AST: 48 U/L — ABNORMAL HIGH (ref 15–41)
Albumin: 3.5 g/dL (ref 3.5–5.0)
Alkaline Phosphatase: 158 U/L — ABNORMAL HIGH (ref 38–126)
Anion gap: 10 (ref 5–15)
BUN: 8 mg/dL (ref 6–20)
CHLORIDE: 106 mmol/L (ref 101–111)
CO2: 19 mmol/L — AB (ref 22–32)
CREATININE: 0.99 mg/dL (ref 0.61–1.24)
Calcium: 9.1 mg/dL (ref 8.9–10.3)
GFR calc non Af Amer: >60 mL/min (ref >60)
Glucose, Bld: 107 mg/dL — ABNORMAL HIGH (ref 65–99)
POTASSIUM: 4 mmol/L (ref 3.5–5.1)
Sodium: 135 mmol/L (ref 135–145)
Total Bilirubin: 3.9 mg/dL — ABNORMAL HIGH (ref 0.3–1.2)
Total Protein: 7.7 g/dL (ref 6.5–8.1)

## 2017-07-19 LAB — CBC
HCT: 42.5 % (ref 39.0–52.0)
Hemoglobin: 15.1 g/dL (ref 13.0–17.0)
MCH: 34.2 pg — AB (ref 26.0–34.0)
MCHC: 35.5 g/dL (ref 30.0–36.0)
MCV: 96.4 fL (ref 78.0–100.0)
PLATELETS: 89 10*3/uL — AB (ref 150–400)
RBC: 4.41 MIL/uL (ref 4.22–5.81)
RDW: 15.1 % (ref 11.5–15.5)
WBC: 6.5 10*3/uL (ref 4.0–10.5)

## 2017-07-19 LAB — AMMONIA: Ammonia: 59 umol/L — ABNORMAL HIGH (ref 9–35)

## 2017-07-19 MED ORDER — CHLORHEXIDINE GLUCONATE 0.12 % MT SOLN
15.0000 mL | Freq: Two times a day (BID) | OROMUCOSAL | Status: DC
  Administered 2017-07-20 – 2017-07-24 (×9): 15 mL via OROMUCOSAL
  Filled 2017-07-19 (×7): qty 15

## 2017-07-19 MED ORDER — ORAL CARE MOUTH RINSE
15.0000 mL | Freq: Two times a day (BID) | OROMUCOSAL | Status: DC
  Administered 2017-07-20 – 2017-07-23 (×7): 15 mL via OROMUCOSAL

## 2017-07-19 MED ORDER — WHITE PETROLATUM EX OINT
TOPICAL_OINTMENT | CUTANEOUS | Status: AC
  Administered 2017-07-20: 03:00:00
  Filled 2017-07-19: qty 28.35

## 2017-07-19 MED ORDER — LORAZEPAM 2 MG/ML IJ SOLN
1.0000 mg | INTRAMUSCULAR | Status: DC | PRN
  Administered 2017-07-19: 3 mg via INTRAVENOUS
  Administered 2017-07-19 (×2): 2 mg via INTRAVENOUS
  Administered 2017-07-22 – 2017-07-24 (×3): 1 mg via INTRAVENOUS
  Filled 2017-07-19 (×2): qty 1
  Filled 2017-07-19: qty 2
  Filled 2017-07-19 (×2): qty 1
  Filled 2017-07-19: qty 2

## 2017-07-19 NOTE — Progress Notes (Signed)
Time Progress Note Patient Name: Michael Mcfarland DOB: 05-03-1965 MRN: [id number]   Date of Service  07/19/2017  HPI/Events of Note  Pt requested diet, calm and awake on precedex  eICU Interventions  Regular diet ordered     Intervention Category Minor Interventions: Other:;Routine modifications to care plan (e.g. PRN medications for pain, fever)  Collene Gobble 07/19/2017, 8:57 PM

## 2017-07-19 NOTE — Progress Notes (Addendum)
PROGRESS NOTE   Michael Mcfarland  ACZ:[id number]    DOB: [birth year]    DOA: 07/16/2017  PCP: [name], MD   I have briefly reviewed patients previous medical records in [facility]  Brief Narrative:  52 year old male, lives with his sister, independent, PMH of alcohol dependence complicated by seizures, cirrhosis, esophageal varices & thrombocytopenia, anxiety, depression, emphysema, GERD, stroke, tobacco abuse, Xanax abuse, hospitalized 3/19-3/26 for acute hepatic encephalopathy, MRI brain unrevealing at that time, EGD showed grade 2 varices, SNF recommended but discharged home, since then apparently resumed drinking alcohol as well as use of Xanax.  Family concerned that he is getting his hands on alcohol and benzodiazepines and other medications such as Fioricet and removed the pill bottles from his house on Sunday PTA after he fell and hit his head.  Now presented to ED on 4/19 with increasing confusion, tremors.  Admitted for acute toxic metabolic encephalopathy, small subdural hematoma.  Neurosurgery consulted and signed off.  Ongoing withdrawal syndrome that was not adequately managed on CIWA regimen. CCM was consulted 4/21 and patient was transferred to ICU, placed on Precedex drip. Discussed with Dr. Elsworth Soho, PCCM on 4/22, patient being weaned off Precedex drip and should be able to transfer out of ICU hopefully soon and can remain on [facility] service.   Assessment & Plan:   Principal Problem:   SDH (subdural hematoma) (HCC) Active Problems:   Cirrhosis (HCC)   Thrombocytopenia (HCC)   Confusion   Alcohol withdrawal syndrome with perceptual disturbance (HCC)   Benzodiazepine abuse, episodic (HCC)   Subdural hematoma: As per neurosurgery, no surgical indications.  Repeat head CT 4/20 shows reduction in size.  Less likely for this to be because of his mental status changes.  Neurosurgery signed off.  Avoid heparin for DVT prophylaxis.  Stable without change.  Acute toxic metabolic  encephalopathy: CT head as above.  UA negative for UTI features.  UDS negative.  BAL <10.  Ammonia level 34.  Metabolic parameters look unremarkable.  Unclear etiology.?  Substance withdrawal. Delirium precautions. CIWA protocol.  Nicotine patch.  Safety sitter placed last night.  Also has Posey belt (now removed) and mittens.  Due to ongoing agitation and confusion that was not controlled despite multiple doses of IV Ativan, CCM was consulted 4/21, patient was transferred from stepdown to ICU and started on Precedex drip.  Remains on Precedex drip this morning, sedated and not agitated.  Management per CCM team.  Withdrawal syndrome (possibly alcohol and benzodiazepine).  He may need intubation for airway protection if he develops respiratory suppression.  Ammonia level is mildly elevated, 59.  While n.p.o. may consider lactulose enemas, deferred to CCM service.  Alcoholic cirrhosis complicated by esophageal varices, thrombocytopenia, coagulopathy: Less likely to be hepatic encephalopathy.  However continue lactulose 10 g 3 times daily and target for 3 BMs per day.  Continue nadolol.  Continue CIWA and thiamine.  Minimize sedating medications.  One BM documented in the last 24 hours.  Currently NPO while sedated.  May consider changing lactulose to enema.  Chronic thrombocytopenia: Stable.  No bleeding reported.  History of alcohol withdrawal seizures: No seizure-like activity reported.  Seizure precautions and monitor for seizures.  Anxiety and depression: Remains on amitriptyline.  GERD: Continue PPI.  Polysubstance abuse (alcohol, tobacco, Xanax, opioids): Ongoing issue.   DVT prophylaxis: SCD Code Status: CCM discussed with family and changed CODE STATUS to partial code on 4/21 (no CPR, no chest compressions, no shocking but would like intubation  and BiPAP). Family Communication: None at bedside this morning.   Disposition: On 4/21, CCM was consulted and patient was transferred to ICU.  I  discussed with Dr. Elsworth Soho and patient's RN on 4/22 to see if they would take over patient's care onto [facility] service.   Consultants:  Neurosurgery-signed off CCM  Procedures:  Foley catheter.  Antimicrobials:  None   Subjective: Patient sedated on Precedex drip.  Barely opens his eyes to call.  No history obtained from him.  No family at bedside.  Discussed with RN who had him yesterday in stepdown and reported that patient remained extremely agitated on 4/21 until mid afternoon requiring multiple doses of Ativan before transferring to ICU.  Family were unable to calm him down.  I discussed with ICU RN this morning.  No acute issues reported.  Remains on Precedex drip and sedated.  ROS: As above  Objective:  Vitals:   07/19/17 0730 07/19/17 0800 07/19/17 0910 07/19/17 1000  BP:  (!) 152/88 (!) 156/86 (!) 155/86  Pulse:  (!) 56 (!) 58 (!) 55  Resp:  16 15 14   Temp: 98.1 F (36.7 C)     TempSrc: Oral     SpO2:  95% 95% 95%  Weight:      Height:        Examination:  General exam: Middle-aged male, moderately built and overweight, lying comfortably supine in bed. Respiratory system: Clear to auscultation.  No increased work of breathing.  Stable without change. Cardiovascular system: S1 and S2 heard, RRR.  No JVD, murmurs or pedal edema.  Telemetry personally reviewed: SR-SB in the 71s. Gastrointestinal system: Abdomen is nondistended, soft and nontender. No organomegaly or masses felt. Normal bowel sounds heard.  Stable without change. Central nervous system: Mental status as indicated above.  Deeply sedated.  Barely opens his eyes to touch or call. No focal deficits noted. Extremities: Spontaneously moves all limbs with seemingly normal power.  Hand mittens in place. Skin: No rashes, lesions or ulcers Psychiatry: Judgement and insight cannot be assessed at this time. Mood & affect cannot be assessed at this time.     Data Reviewed: I have personally reviewed following labs and  imaging studies  CBC: Recent Labs  Lab 07/16/17 1156 07/17/17 0851 07/19/17 0347  WBC 6.9 6.6 6.5  HGB 14.7 13.5 15.1  HCT 42.6 39.9 42.5  MCV 95.7 96.1 96.4  PLT 96* 90* 89*   Basic Metabolic Panel: Recent Labs  Lab 07/16/17 1156 07/17/17 0851 07/19/17 0347  NA 137 136 135  K 3.9 3.9 4.0  CL 105 105 106  CO2 21* 21* 19*  GLUCOSE 116* 92 107*  BUN 7 10 8   CREATININE 1.02 1.05 0.99  CALCIUM 9.6 9.0 9.1   Liver Function Tests: Recent Labs  Lab 07/16/17 1156 07/17/17 0851 07/19/17 0347  AST 52* 47* 48*  ALT 26 21 25   ALKPHOS 177* 146* 158*  BILITOT 2.2* 2.8* 3.9*  PROT 7.9 7.0 7.7  ALBUMIN 3.7 3.4* 3.5   Coagulation Profile: Recent Labs  Lab 07/16/17 1825  INR 1.38   CBG: Recent Labs  Lab 07/16/17 1150 07/18/17 1757  GLUCAP 130* 77    Recent Results (from the past 240 hour(s))  MRSA PCR Screening     Status: None   Collection Time: 07/17/17 12:35 AM  Result Value Ref Range Status   MRSA by PCR NEGATIVE NEGATIVE Final    Comment:        The GeneXpert MRSA Assay (FDA  approved for NASAL specimens only), is one component of a comprehensive MRSA colonization surveillance program. It is not intended to diagnose MRSA infection nor to guide or monitor treatment for MRSA infections. Performed at [facility], [place] [address] [place], [place] [address]          Radiology Studies: No results found.      Scheduled Meds: . amitriptyline  50 mg Oral QHS  . feeding supplement (ENSURE ENLIVE)  237 mL Oral BID BM  . folic acid  1 mg Oral Daily  . lactulose  10 g Oral TID  . mouth rinse  15 mL Mouth Rinse BID  . multivitamin with minerals  1 tablet Oral Daily  . nadolol  20 mg Oral Daily  . pantoprazole  40 mg Oral Q1200  . thiamine  100 mg Oral Daily   Or  . thiamine  100 mg Intravenous Daily   Continuous Infusions: . sodium chloride 50 mL/hr at 07/19/17 0600  . dexmedetomidine (PRECEDEX) IV infusion 0.7 mcg/kg/hr (07/19/17 0919)       LOS: 3 days     Vernell Leep, MD, FACP, [facility]. [facility] Pager [phone]  If 7PM-7AM, please contact night-coverage [email or web address] Password [facility] 07/19/2017, 10:16 AM

## 2017-07-19 NOTE — Progress Notes (Signed)
RN informed CSW that patient sister, Mariann Laster, wished to speak with CSW regarding rehab placement.  Patient sister had gotten a notice that pt approved to go to SNF for 30 days and that this was applied for during last admission.   CSW completed chart review and informed pt sister that this approval was for admission to SNF but was not to pay for SNF  Pt still without payor source for SNF stay- CSW looked at financial counseling notes and pt apparently has over the limit for savings and does not qualify for medicaid  CSW signing off at this time  Jorge Ny, [place] Social Worker [phone]

## 2017-07-19 NOTE — Progress Notes (Signed)
Initial Pulmonary/Critical Care Consultation  Patient Name: Michael Mcfarland MRN: [id number] DOB: December 17, 1965    ADMISSION DATE:  07/16/2017 CONSULTATION DATE:  07/18/2017  REFERRING MD:  Dr. Vernell Leep  REASON FOR CONSULTATION:  Withdrawal syndrome   HISTORY OF PRESENT ILLNESS  This 52 y.o. Caucasian male smoker is seen in consultation at the request of Dr. Vernell Leep for recommendations on further evaluation and management of withdrawal syndrome, associated with hallucinations and delirium. The patient admits to chronic alcohol and Xanax abuse. He was admitted on 07/16/2017 after family and friends had concerns about altered mental status, associated with frequent falls. He was hospitalized last month with hepatic encephalopathy, but his serum ammonia levels have been normal during this admission. He did have a small subdural hematoma discovered during this admission, but this is not believed to be the cause of his psychomotor agitation. During this hospitalization, the patient has been on CIWA protocol and received multiple large doses of Ativan with minimal therapeutic benefit. Again, he has a well-known history of Xanax abuse. At the time of clinical interview, he is uncooperative. While non-combative, he is unable to keep still and is at high risk of self-inflicted injury and is complicating attempts at treatment. He has needed physical restraints which have been of limited effect and now raise concern for additional risk of self-inflicted injury. We are consulted for consideration of use of more potent agents, potentially requiring transfer to an ICU setting.  He lives with sister. His step-son is at the bedside and reports that the patient basically constantly consumes alcohol and abuses "pills".  SUBJ -sedated on precedex @ 0.7 Calm Afebrile   VITAL SIGNS: BP (!) 156/136   Pulse (!) 58   Temp 97.8 F (36.6 C) (Oral)   Resp 14   Ht 6' (1.829 m)   Wt 218 lb (98.9 kg)   SpO2  95%   BMI 29.57 kg/m   INTAKE / OUTPUT: I/O last 3 completed shifts: In: 2485.2 [P.O.:240; I.V.:2245.2] Out: 875 [Urine:875]  PHYSICAL EXAMINATION: GENERAL: calm, sedate , no distress HEAD: normocephalic, atraumatic EYE: PERRLA, EOM intact, no  icterus, no pallor.  NECK: supple, no thyromegaly, no JVD, no lymphadenopathy. Trachea midline. CHEST/LUNG: symmetric in development and expansion. Good air entry. No crackles. No wheezes. HEART: Regular S1 and S2 without murmur, rub or gallop. ABDOMEN: soft, nontender, nondistended. Normoactive bowel sounds. No rebound. No guarding. EXTREMITIES: Edema: none. No cyanosis. No 3clubbing. 2+ DP pulses LYMPHATIC: no cervical/axiallary/inguinal lymph nodes appreciated MUSCULOSKELETAL: No point tenderness. No bulk atrophy. Joints: normal.  SKIN:  No rash or lesion. NEUROLOGIC: easily arousable, follows 1 step commands   LABS:  ABG No results for input(s): PHART, PCO2ART, PO2ART in the last 168 hours.  Cardiac Enzymes No results for input(s): TROPONINI, PROBNP in the last 168 hours.  BASIC METABOLIC PROFILE Recent Labs  Lab 07/16/17 1156 07/17/17 0851 07/19/17 0347  NA 137 136 135  K 3.9 3.9 4.0  CL 105 105 106  CO2 21* 21* 19*  BUN 7 10 8   CREATININE 1.02 1.05 0.99  GLUCOSE 116* 92 107*  CALCIUM 9.6 9.0 9.1    Glucose Recent Labs  Lab 07/16/17 1150 07/18/17 1757  GLUCAP 130* 77    Liver Enzymes Recent Labs  Lab 07/16/17 1156 07/17/17 0851 07/19/17 0347  AST 52* 47* 48*  ALT 26 21 25   ALKPHOS 177* 146* 158*  BILITOT 2.2* 2.8* 3.9*  ALBUMIN 3.7 3.4* 3.5    CBC Recent Labs  Lab 07/16/17  1156 07/17/17 0851 07/19/17 0347  WBC 6.9 6.6 6.5  HGB 14.7 13.5 15.1  HCT 42.6 39.9 42.5  PLT 96* 90* 89*    COAGULATION STUDIES Recent Labs  Lab 07/16/17 1825  INR 1.38    SEPSIS MARKERS No results for input(s): LATICACIDVEN, PROCALCITON, O2SATVEN in the last 168 hours.  CULTURES: Results for orders placed or  performed during the hospital encounter of 07/16/17  MRSA PCR Screening     Status: None   Collection Time: 07/17/17 12:35 AM  Result Value Ref Range Status   MRSA by PCR NEGATIVE NEGATIVE Final    Comment:        The GeneXpert MRSA Assay (FDA approved for NASAL specimens only), is one component of a comprehensive MRSA colonization surveillance program. It is not intended to diagnose MRSA infection nor to guide or monitor treatment for MRSA infections. Performed at [facility], [place] [address], [place], [place] [address]     IMAGING: CT head (4/20): Slightly decreased size of focal subcentimeter extra-axial hemorrhage in the left interhemispheric fissure. No evidence of new intracranial abnormality.   SIGNIFICANT EVENTS: 4/19: admitted with concerns for falls and altered mental status: withdrawal syndrome. Small SDH discovered, radiographically improving. 4/21: consulted for difficulties in management on CIWA protocol.   ASSESSMENT / PLAN: Principal Problem:   SDH (subdural hematoma) (HCC) Active Problems:   Cirrhosis (HCC)   Thrombocytopenia (HCC)   Confusion   Alcohol withdrawal syndrome with perceptual disturbance (HCC)   Benzodiazepine abuse, episodic (HCC)   NEUROLOGIC  Alcohol withdrawal syndrome with hallucinations  Concurrent benzodiazepine withdrawal  Resume ativan 1-3 mg Q 4h prn per CIWA Taper off precedex gtt Haldol prn  GASTROINTESTINAL  Alcoholic liver cirrhosis  On lactulose & nadolol  HEMATOLOGIC  Thrombocytopenia, likely related to chronic liver disease Monitor platelets   FAMILY  - Updates:  Step-son on 4/21  CODE STATUS: LIMITED (intubation OK, no ACLS)  Rakesh V. Elsworth Soho, MD Board Certified by the ABIM, Pulmonary Diseases & Critical Care Medicine  Kara Mead MD. [facility]. [place] [facility] Pager [phone] If no response call [phone]     07/19/2017, 12:00 PM

## 2017-07-20 ENCOUNTER — Telehealth: Payer: Self-pay | Admitting: Family Medicine

## 2017-07-20 DIAGNOSIS — S065X9A Traumatic subdural hemorrhage with loss of consciousness of unspecified duration, initial encounter: Principal | ICD-10-CM

## 2017-07-20 NOTE — Progress Notes (Signed)
Patient would like to do this an Wednesday at 2:00.  He wants his family to be here. (brother and sister in law?)  Patient seemed well oriented and able to do this without an issue.  2:00 Wed should be ready for completion. Conard Novak, Chaplain   07/20/17 1100  Clinical Encounter Type  Visited With Patient  Visit Type Initial;Spiritual support;Other (Comment) (Advanced Directive Information given-He wants Wed 2:00)  Referral From Physician  Consult/Referral To Chaplain  Spiritual Encounters  Spiritual Needs Prayer;Emotional  Stress Factors  Patient Stress Factors None identified  Family Stress Factors None identified  Advance Directives (For Healthcare)  Does Patient Have a Medical Advance Directive? No  Would patient like information on creating a medical advance directive? Yes (Inpatient - patient requests chaplain consult to create a medical advance directive)

## 2017-07-20 NOTE — Telephone Encounter (Signed)
See note

## 2017-07-20 NOTE — Progress Notes (Signed)
Initial Pulmonary/Critical Care Consultation  Patient Name: Michael Mcfarland MRN: [id number] DOB: 12-22-1965    ADMISSION DATE:  07/16/2017 CONSULTATION DATE:  07/18/2017  REFERRING MD:  Dr. Vernell Leep  REASON FOR CONSULTATION:  Withdrawal syndrome   HISTORY OF PRESENT ILLNESS  This 52 y.o. Caucasian male smoker is seen in consultation at the request of Dr. Vernell Leep for recommendations on further evaluation and management of withdrawal syndrome, associated with hallucinations and delirium. The patient admits to chronic alcohol and Xanax abuse. He was admitted on 07/16/2017 after family and friends had concerns about altered mental status, associated with frequent falls. He was hospitalized last month with hepatic encephalopathy, but his serum ammonia levels have been normal during this admission. He did have a small subdural hematoma discovered during this admission, but this is not believed to be the cause of his psychomotor agitation. During this hospitalization, the patient has been on CIWA protocol and received multiple large doses of Ativan with minimal therapeutic benefit. Again, he has a well-known history of Xanax abuse. At the time of clinical interview, he is uncooperative. While non-combative, he is unable to keep still and is at high risk of self-inflicted injury and is complicating attempts at treatment. He has needed physical restraints which have been of limited effect and now raise concern for additional risk of self-inflicted injury. We are consulted for consideration of use of more potent agents, potentially requiring transfer to an ICU setting.  He lives with sister. His step-son is at the bedside and reports that the patient basically constantly consumes alcohol and abuses "pills".  SUBJ - Precedex is weaning to off.  He is awake alert no acute distress   VITAL SIGNS: BP (!) 153/93   Pulse (!) 59   Temp 98 F (36.7 C) (Oral)   Resp (!) 23   Ht 6' (1.829 m)   Wt  98.9 kg (218 lb)   SpO2 94%   BMI 29.57 kg/m   INTAKE / OUTPUT: I/O last 3 completed shifts: In: 2368.6 [I.V.:2368.6] Out: 2185 [Urine:2185]  PHYSICAL EXAMINATION: General: Well-nourished well-developed male in no acute distress HEENT: No lymphadenopathy or JVD appreciated PSY: Mild confusion but readily orientated. Neuro: Generally intact moves all extremities follows commands CV: s1s2 rrr, no m/r/g PULM: even/non-labored, lungs bilaterally managed in bases KC:LEXN, non-tender, bsx4 active  Extremities: warm/dry, negative edema  Skin: no rashes or lesions     LABS:  ABG No results for input(s): PHART, PCO2ART, PO2ART in the last 168 hours.  Cardiac Enzymes No results for input(s): TROPONINI, PROBNP in the last 168 hours.  BASIC METABOLIC PROFILE Recent Labs  Lab 07/16/17 1156 07/17/17 0851 07/19/17 0347  NA 137 136 135  K 3.9 3.9 4.0  CL 105 105 106  CO2 21* 21* 19*  BUN 7 10 8   CREATININE 1.02 1.05 0.99  GLUCOSE 116* 92 107*  CALCIUM 9.6 9.0 9.1    Glucose Recent Labs  Lab 07/16/17 1150 07/18/17 1757  GLUCAP 130* 77    Liver Enzymes Recent Labs  Lab 07/16/17 1156 07/17/17 0851 07/19/17 0347  AST 52* 47* 48*  ALT 26 21 25   ALKPHOS 177* 146* 158*  BILITOT 2.2* 2.8* 3.9*  ALBUMIN 3.7 3.4* 3.5    CBC Recent Labs  Lab 07/16/17 1156 07/17/17 0851 07/19/17 0347  WBC 6.9 6.6 6.5  HGB 14.7 13.5 15.1  HCT 42.6 39.9 42.5  PLT 96* 90* 89*    COAGULATION STUDIES Recent Labs  Lab 07/16/17 1825  INR  1.38    SEPSIS MARKERS No results for input(s): LATICACIDVEN, PROCALCITON, O2SATVEN in the last 168 hours.  CULTURES: Results for orders placed or performed during the hospital encounter of 07/16/17  MRSA PCR Screening     Status: None   Collection Time: 07/17/17 12:35 AM  Result Value Ref Range Status   MRSA by PCR NEGATIVE NEGATIVE Final    Comment:        The GeneXpert MRSA Assay (FDA approved for NASAL specimens only), is one  component of a comprehensive MRSA colonization surveillance program. It is not intended to diagnose MRSA infection nor to guide or monitor treatment for MRSA infections. Performed at [facility], [place] [address] [place], [place] [address]     IMAGING: CT head (4/20): Slightly decreased size of focal subcentimeter extra-axial hemorrhage in the left interhemispheric fissure. No evidence of new intracranial abnormality.   SIGNIFICANT EVENTS: 4/19: admitted with concerns for falls and altered mental status: withdrawal syndrome. Small SDH discovered, radiographically improving. 4/21: consulted for difficulties in management on CIWA protocol. 423 Precedex.  Tapered off.   ASSESSMENT / PLAN: Principal Problem:   SDH (subdural hematoma) (HCC) Active Problems:   Cirrhosis (HCC)   Thrombocytopenia (HCC)   Confusion   Alcohol withdrawal syndrome with perceptual disturbance (HCC)   Benzodiazepine abuse, episodic (HCC)   NEUROLOGIC  Alcohol withdrawal syndrome with hallucinations  Concurrent benzodiazepine withdrawal  CIWA protocol Precedex weaning to off on 07/20/2017  GASTROINTESTINAL  Alcoholic liver cirrhosis  On lactulose  HEMATOLOGIC  Thrombocytopenia, likely related to chronic liver disease  . Monitor platelets 90->89 stable   Discussion: 07/20/2017 pulmonary critical care will sign off please call if needed  FAMILY  - Updates: Family updated 07/20/2017  CODE STATUS: LIMITED (intubation OK, no ACLS)  Richardson Landry Denisse Whitenack ACNP Maryanna Shape PCCM Pager [phone] till 1 pm If no answer page [phone] 07/20/2017, 8:01 AM

## 2017-07-20 NOTE — Care Management Note (Addendum)
Case Management Note  Patient Details  Name: Michael Mcfarland MRN: [id number] Date of Birth: 1966-03-18  Subjective/Objective:     History of tobacco abuse, ETOH/Xanax abuse, Cirrhosis.  Admitted for SDH (subdural hematoma). PCP noted.        Action/Plan: Prior to admission patient with sister.  Prior to admission patient active with [place] for: Speech, OT, social worker.  NCM will continue to follow for discharge transition needs.  Expected Discharge Date:     To be determined             Expected Discharge Plan:   To be determined  In-House Referral:  Chaplain, Palliative, Occupational psychologist  CM Consult  Status of Service:  In process, will continue to follow  If discussed at Long Length of Stay Meetings, dates discussed:    Additional Comments:  Kristen Cardinal, RN 07/20/2017, 2:15 PM

## 2017-07-20 NOTE — Telephone Encounter (Signed)
Copied from [place] [id number]. Topic: Quick Communication - See Telephone Encounter >> Jul 20, 2017 10:20 AM Rutherford Nail, NT wrote: CRM for notification. See Telephone encounter for: 07/20/17. Alonna Minium, speech pathologist at [facility], calling to inform Dr [name] that patient in is the hospital. States that she tried to call the patient and the sister answered the phone and informed her that the patient fell on 07/16/17 and hit his head. Stated that he had some confusion and was talking out of his head so they took him to the hospital. Allion states that she was told that he is in the ICU with a closed head injury. Please advise. CB#: [phone]

## 2017-07-20 NOTE — Progress Notes (Signed)
PROGRESS NOTE   Michael Mcfarland  FAO:[id number]    DOB: [birth year]    DOA: 07/16/2017  PCP: [name], MD   Brief Narrative:  52 year old male, lives with his sister, independent, PMH of alcohol dependence complicated by seizures, cirrhosis, esophageal varices & thrombocytopenia, anxiety, depression, emphysema, GERD, stroke, tobacco abuse, Xanax abuse, hospitalized 3/19-3/26 for acute hepatic encephalopathy, MRI brain unrevealing at that time, EGD showed grade 2 varices, SNF recommended but discharged home, since then apparently resumed drinking alcohol as well as use of Xanax.  Family concerned that he is getting his hands on alcohol and benzodiazepines and other medications such as Fioricet and removed the pill bottles from his house on Sunday PTA after he fell and hit his head.  Now presented to ED on 4/19 with increasing confusion, tremors.  Admitted for acute toxic metabolic encephalopathy, small subdural hematoma.  Neurosurgery consulted and signed off.  Ongoing withdrawal syndrome that was not adequately managed on CIWA regimen. CCM was consulted 4/21 and patient was transferred to ICU, placed on Precedex drip. Discussed with Dr. Elsworth Soho, PCCM on 4/22, patient being weaned off Precedex drip and should be able to transfer out of ICU hopefully soon and can remain on [facility] service.  07/20/2017: Patient seen.  ICU input is highly appreciated.  Patient seems stable.  Patient is not a particularly good historian, and tends to minimize alcohol use.  Issues surrounding disposition and payor source noted.  Assessment & Plan:   Principal Problem:   SDH (subdural hematoma) (HCC) Active Problems:   Cirrhosis (HCC)   Thrombocytopenia (HCC)   Confusion   Alcohol withdrawal syndrome with perceptual disturbance (HCC)   Benzodiazepine abuse, episodic (HCC)   Subdural hematoma: As per neurosurgery, no surgical indications.  Repeat head CT 4/20 shows reduction in size.  Less likely for this to be because  of his mental status changes.  Neurosurgery signed off.  Avoid heparin for DVT prophylaxis.  Stable without change.  Acute toxic metabolic encephalopathy: CT head as above.  UA negative for UTI features.  UDS negative.  BAL <10.  Ammonia level 34.  Metabolic parameters look unremarkable.  Unclear etiology.?  Substance withdrawal. Delirium precautions. CIWA protocol.  Nicotine patch.  Safety sitter placed last night.  Also has Posey belt (now removed) and mittens.  Due to ongoing agitation and confusion that was not controlled despite multiple doses of IV Ativan, CCM was consulted 4/21, patient was transferred from stepdown to ICU and started on Precedex drip.  Remains on Precedex drip this morning, sedated and not agitated.  Management per CCM team.  Withdrawal syndrome (possibly alcohol and benzodiazepine).  He may need intubation for airway protection if he develops respiratory suppression.  Ammonia level is mildly elevated, 59.  While n.p.o. may consider lactulose enemas, deferred to CCM service.  Alcoholic cirrhosis complicated by esophageal varices, thrombocytopenia, coagulopathy: Less likely to be hepatic encephalopathy.  However continue lactulose 10 g 3 times daily and target for 3 BMs per day.  Continue nadolol.  Continue CIWA and thiamine.  Minimize sedating medications.  One BM documented in the last 24 hours.  Currently NPO while sedated.  May consider changing lactulose to enema.  Chronic thrombocytopenia: Stable.  No bleeding reported.  History of alcohol withdrawal seizures: No seizure-like activity reported.  Seizure precautions and monitor for seizures.  Anxiety and depression: Remains on amitriptyline.  GERD: Continue PPI.  Polysubstance abuse (alcohol, tobacco, Xanax, opioids): Ongoing issue.   DVT prophylaxis: SCD Code Status: CCM discussed  with family and changed CODE STATUS to partial code on 4/21 (no CPR, no chest compressions, no shocking but would like intubation and  BiPAP). Family Communication:    Disposition: On 4/21, CCM was consulted and patient was transferred to ICU.  I discussed with Dr. Elsworth Soho and patient's RN on 4/22 to see if they would take over patient's care onto [facility] service.   Consultants:  Neurosurgery-signed off CCM  Procedures:  Foley catheter.  Antimicrobials:  None   Subjective: No complaints.  The patient is awake and alert.  No tremors, no sweating, no fever chills, no shortness of breath or chest pain.  Patient minimizes alcohol use.  Patient is not a particularly good historian.  ROS: As above  Objective:  Vitals:   07/20/17 1100 07/20/17 1108 07/20/17 1215 07/20/17 1536  BP:  136/84    Pulse: 72 70 71   Resp: (!) 24 (!) 23 (!) 24   Temp:   98.8 F (37.1 C) 98.7 F (37.1 C)  TempSrc:    Oral  SpO2: 95% 98% 98%   Weight:      Height:        Examination:  General exam: Middle-aged male, moderately built and overweight, lying comfortably supine in bed. Respiratory system: Clear to auscultation.  No increased work of breathing.  Stable without change. Cardiovascular system: S1 and S2 heard, RRR.  No JVD, murmurs or pedal edema.  Telemetry personally reviewed: SR-SB in the 56s. Gastrointestinal system: Abdomen is nondistended, soft and nontender. No organomegaly or masses felt. Normal bowel sounds heard.  Stable without change. Central nervous system: Mental status as indicated above.  Deeply sedated.  Barely opens his eyes to touch or call. No focal deficits noted. Extremities: Spontaneously moves all limbs with seemingly normal power.  Hand mittens in place.  Data Reviewed: I have personally reviewed following labs and imaging studies  CBC: Recent Labs  Lab 07/16/17 1156 07/17/17 0851 07/19/17 0347  WBC 6.9 6.6 6.5  HGB 14.7 13.5 15.1  HCT 42.6 39.9 42.5  MCV 95.7 96.1 96.4  PLT 96* 90* 89*   Basic Metabolic Panel: Recent Labs  Lab 07/16/17 1156 07/17/17 0851 07/19/17 0347  NA 137 136 135  K  3.9 3.9 4.0  CL 105 105 106  CO2 21* 21* 19*  GLUCOSE 116* 92 107*  BUN 7 10 8   CREATININE 1.02 1.05 0.99  CALCIUM 9.6 9.0 9.1   Liver Function Tests: Recent Labs  Lab 07/16/17 1156 07/17/17 0851 07/19/17 0347  AST 52* 47* 48*  ALT 26 21 25   ALKPHOS 177* 146* 158*  BILITOT 2.2* 2.8* 3.9*  PROT 7.9 7.0 7.7  ALBUMIN 3.7 3.4* 3.5   Coagulation Profile: Recent Labs  Lab 07/16/17 1825  INR 1.38   CBG: Recent Labs  Lab 07/16/17 1150 07/18/17 1757  GLUCAP 130* 77    Recent Results (from the past 240 hour(s))  MRSA PCR Screening     Status: None   Collection Time: 07/17/17 12:35 AM  Result Value Ref Range Status   MRSA by PCR NEGATIVE NEGATIVE Final    Comment:        The GeneXpert MRSA Assay (FDA approved for NASAL specimens only), is one component of a comprehensive MRSA colonization surveillance program. It is not intended to diagnose MRSA infection nor to guide or monitor treatment for MRSA infections. Performed at [facility], [place] [address] [place], [place] [address]          Radiology  Studies: No results found.      Scheduled Meds: . amitriptyline  50 mg Oral QHS  . chlorhexidine  15 mL Mouth Rinse BID  . feeding supplement (ENSURE ENLIVE)  237 mL Oral BID BM  . folic acid  1 mg Oral Daily  . lactulose  10 g Oral TID  . mouth rinse  15 mL Mouth Rinse q12n4p  . multivitamin with minerals  1 tablet Oral Daily  . nadolol  20 mg Oral Daily  . pantoprazole  40 mg Oral Q1200  . thiamine  100 mg Oral Daily   Or  . thiamine  100 mg Intravenous Daily   Continuous Infusions: . sodium chloride 50 mL/hr at 07/20/17 0800  . dexmedetomidine (PRECEDEX) IV infusion Stopped (07/20/17 0855)     LOS: 4 days     Bonnell Public, MD. [facility] Pager [phone].   If 7PM-7AM, please contact night-coverage [email or web address] Password [facility] 07/20/2017, 3:38 PM

## 2017-07-20 NOTE — Progress Notes (Signed)
2200  Assisted patient to bathroom for large loose BM.  Lactulose held as per multiple loose BMs.  Patient ambulated to bathroom with minimal assist x1.  Patient understands he MUST ring call bell because of all the wires.  He assured me he would.  Patient ambulated back to bed with my assist without incident.  Patient comfortable at this time

## 2017-07-20 NOTE — Progress Notes (Signed)
[facility]  Patient Status: Active (receiving services up to time of hospitalization)  [facility] is providing the following services: OT, ST and MSW  If patient discharges after hours, please call [phone].   Michael Mcfarland 07/20/2017, 2:25 PM

## 2017-07-21 NOTE — Progress Notes (Signed)
Transfer   Pt was brought to unit via wheelchair. Pt has no complaints of pain or any other concerns at this time. Will continue to monitor. Belongings (clothing) followed pt to this room.

## 2017-07-21 NOTE — Progress Notes (Signed)
Patient Demographics:    Michael Mcfarland, is a 52 y.o. male, DOB - Oct 17, 1965, ONG:[id number]  Admit date - 07/16/2017   Admitting Physician Etta Quill, DO  Outpatient Primary MD for the patient is [name], MD  LOS - 5   Chief Complaint  Patient presents with  . Altered Mental Status        Subjective:    Michael Mcfarland today has no fevers, no emesis,  No chest pain, had multiple loose BMs,  Assessment  & Plan :    Principal Problem:   SDH (subdural hematoma) (HCC) Active Problems:   Cirrhosis (HCC)   Thrombocytopenia (HCC)   Confusion   Alcohol withdrawal syndrome with perceptual disturbance (HCC)   Benzodiazepine abuse, episodic (HCC)  Brief Narrative:  52 year old male, lives with his sister, independent, PMH of alcohol dependence complicated by seizures, cirrhosis, esophageal varices & thrombocytopenia, anxiety, depression, emphysema, GERD, stroke, tobacco abuse, Xanax abuse, hospitalized 3/19-3/26 for acute hepatic encephalopathy, MRI brain unrevealing at that time, EGD showed grade 2 varices, SNF recommended but discharged home, since then apparently resumed drinking alcohol as well as use of Xanax.  Family concerned that he is getting his hands on alcohol and benzodiazepines and other medications such as Fioricet and removed the pill bottles from his house on Sunday PTA after he fell and hit his head.  Now presented to ED on 4/19 with increasing confusion, tremors.  Admitted for acute toxic metabolic encephalopathy, small subdural hematoma.  Neurosurgery consulted and signed off.  Ongoing withdrawal syndrome that was not adequately managed on CIWA regimen. CCM was consulted 4/21 and patient was transferred to ICU, placed on Precedex drip.  patient  weaned off Precedex drip ,  transfer out of ICU to telementry on 07/21/17 .    1)Acute toxic metabolic encephalopathy: CT head as above.  UA  negative for UTI features.  UDS negative.  BAL <10.  Ammonia level 59.  Metabolic parameters look unremarkable.  Unclear etiology.  Suspect due to Substance withdrawal (alcohol and benzo),  Delirium has resolved, patient is off Precedex drip, c/n ativan per CIWA protocol.  Nicotine patch.  Safety sitter and restraints have been removed.    Give folic acid and thiamine, patient apparently with history of previous alcohol withdrawal patient was associated with seizures at that time but none this time around   2)Alcoholic cirrhosis complicated by esophageal varices, thrombocytopenia, coagulopathy: encephalopathy/mental status has improved with lactulose,.   continue lactulose 10 g 3 times daily and target for 3 BMs per day.  Continue nadolol 20 mg daily,  Continue CIWA and thiamine, folic acid, continue Protonix  3)Subdural hematoma: As per neurosurgery, no surgical indications.  Repeat head CT 4/20 shows reduction in size.  Less likely for this to be because of his mental status changes.  Neurosurgery signed off.  Avoid heparin for DVT prophylaxis.  Stable without change.  4)Anxiety and depression: pt with h/o Polysubstance abuse (alcohol, tobacco, Xanax, opioids), be judicious with controlled substance, okay to continue Elavil 50 mg qhs   5)Tobacco Abuse-  Smoking cessation counseling for 4 minutes today, consider nicotine patch   Code Status : PCCM discussed with family and changed CODE STATUS to partial code on 07/18/17 (no CPR, no chest  compressions, no shocking but would like intubation and BiPAP).   Disposition Plan  : TBD, sw consult  Consults  :   Neurosurgery-signed off PCCM   DVT Prophylaxis  :  SCDs   Lab Results  Component Value Date   PLT 89 (L) 07/19/2017    Inpatient Medications  Scheduled Meds: . amitriptyline  50 mg Oral QHS  . chlorhexidine  15 mL Mouth Rinse BID  . feeding supplement (ENSURE ENLIVE)  237 mL Oral BID BM  . folic acid  1 mg Oral Daily  . lactulose   10 g Oral TID  . mouth rinse  15 mL Mouth Rinse q12n4p  . multivitamin with minerals  1 tablet Oral Daily  . nadolol  20 mg Oral Daily  . pantoprazole  40 mg Oral Q1200  . thiamine  100 mg Oral Daily   Or  . thiamine  100 mg Intravenous Daily   Continuous Infusions: . sodium chloride 50 mL/hr at 07/21/17 0400  . dexmedetomidine (PRECEDEX) IV infusion Stopped (07/20/17 0855)   PRN Meds:.acetaminophen **OR** acetaminophen, haloperidol lactate, LORazepam, ondansetron **OR** ondansetron (ZOFRAN) IV    Anti-infectives (From admission, onward)   None        Objective:   Vitals:   07/21/17 1000 07/21/17 1100 07/21/17 1125 07/21/17 1200  BP: (!) 148/90 138/85    Pulse: 76 72  87  Resp: 17 14  19   Temp:   99.2 F (37.3 C)   TempSrc:   Oral   SpO2: 94% 93%  98%  Weight:      Height:        Wt Readings from Last 3 Encounters:  07/16/17 98.9 kg (218 lb)  07/08/17 98.4 kg (217 lb)  06/22/17 93.3 kg (205 lb 11.2 oz)     Intake/Output Summary (Last 24 hours) at 07/21/2017 1315 Last data filed at 07/21/2017 1205 Gross per 24 hour  Intake 1890 ml  Output 301 ml  Net 1589 ml     Physical Exam  Gen:- Awake Alert, in no acute distress HEENT:- [place].AT, No sclera icterus Nose- [place] 1 L/min Neck-Supple Neck,No JVD,.  Lungs-  CTAB , good air movement CV- S1, S2 normal Abd-  +ve B.Sounds, Abd Soft, No tenderness,    Extremity/Skin:- No  edema,   good pulses Psych-affect is appropriate, oriented x3 Neuro-no new focal deficits, no tremors   Data Review:   Micro Results Recent Results (from the past 240 hour(s))  MRSA PCR Screening     Status: None   Collection Time: 07/17/17 12:35 AM  Result Value Ref Range Status   MRSA by PCR NEGATIVE NEGATIVE Final    Comment:        The GeneXpert MRSA Assay (FDA approved for NASAL specimens only), is one component of a comprehensive MRSA colonization surveillance program. It is not intended to diagnose MRSA infection nor to guide  or monitor treatment for MRSA infections. Performed at [facility], [place] [address], [place], [place] [address]     Radiology Reports Ct Head Wo Contrast  Result Date: 07/17/2017 CLINICAL DATA:  Follow-up intracranial hemorrhage. EXAM: CT HEAD WITHOUT CONTRAST TECHNIQUE: Contiguous axial images were obtained from the base of the skull through the vertex without intravenous contrast. COMPARISON:  07/16/2017 FINDINGS: Brain: Subcentimeter focus of acute extra-axial hemorrhage in the left interhemispheric fissure, posterior frontal region, has slightly decreased in size and density and may be subdural or subarachnoid. No intracranial hemorrhage is identified elsewhere. There is no evidence  of acute infarct, mass/mass effect, or significant extra-axial fluid collection. There is minimal cerebral atrophy. Periventricular white matter T2 hyperintensities are nonspecific but compatible with minimal chronic small vessel ischemic disease. Vascular: Calcified atherosclerosis at the skull base. Skull: No fracture or focal osseous lesion. Sinuses/Orbits: Moderate bilateral ethmoid air cell mucosal thickening. Mild mucosal thickening in the frontal and maxillary sinuses. No sinus fluid level. Clear mastoid air cells. Unremarkable orbits. Other: None. IMPRESSION: 1. Slightly decreased size of focal subcentimeter extra-axial hemorrhage in the left interhemispheric fissure. 2. No evidence of new intracranial abnormality. Electronically Signed   By: Logan Bores M.D.   On: 07/17/2017 08:48   Ct Head Wo Contrast  Result Date: 07/16/2017 CLINICAL DATA:  Patient fell in bathroom this morning. Increasing confusion. EXAM: CT HEAD WITHOUT CONTRAST TECHNIQUE: Contiguous axial images were obtained from the base of the skull through the vertex without intravenous contrast. COMPARISON:  CT 06/15/2017 FINDINGS: Brain: Tiny left 7 x 3 x 6 mm parafalcine subdural hematoma. No intraparenchymal hemorrhage, edema or midline  shift. Chronic mild small vessel ischemic disease of periventricular white matter. No large vascular territory infarct. Midline fourth ventricle and basal cisterns. Vascular: No hyperdense vessel sign. Atherosclerosis of the cavernous carotids and both vertebral arteries. Skull: No acute skull fracture or suspicious osseous lesions. Sinuses/Orbits: Mild ethmoid, bilateral maxillary and frontal sinus mucosal thickening. Clear sphenoid sinuses and mastoids. Intact orbits and globes. Other: None IMPRESSION: 1. Acute 7 x 3 x 6 mm left parafalcine subdural hematoma. 2. Mild chronic small vessel ischemic disease of periventricular white matter. These results were called by telephone at the time of interpretation on 07/16/2017 at 8:41 pm to Dr. Lennice Sites , who verbally acknowledged these results. Electronically Signed   By: Ashley Royalty M.D.   On: 07/16/2017 20:41     CBC Recent Labs  Lab 07/16/17 1156 07/17/17 0851 07/19/17 0347  WBC 6.9 6.6 6.5  HGB 14.7 13.5 15.1  HCT 42.6 39.9 42.5  PLT 96* 90* 89*  MCV 95.7 96.1 96.4  MCH 33.0 32.5 34.2*  MCHC 34.5 33.8 35.5  RDW 15.3 15.3 15.1    Chemistries  Recent Labs  Lab 07/16/17 1156 07/17/17 0851 07/19/17 0347  NA 137 136 135  K 3.9 3.9 4.0  CL 105 105 106  CO2 21* 21* 19*  GLUCOSE 116* 92 107*  BUN 7 10 8   CREATININE 1.02 1.05 0.99  CALCIUM 9.6 9.0 9.1  AST 52* 47* 48*  ALT 26 21 25   ALKPHOS 177* 146* 158*  BILITOT 2.2* 2.8* 3.9*   ------------------------------------------------------------------------------------------------------------------ No results for input(s): CHOL, HDL, LDLCALC, TRIG, CHOLHDL, LDLDIRECT in the last 72 hours.  Lab Results  Component Value Date   HGBA1C 5.6 02/10/2017   ------------------------------------------------------------------------------------------------------------------ No results for input(s): TSH, T4TOTAL, T3FREE, THYROIDAB in the last 72 hours.  Invalid input(s):  FREET3 ------------------------------------------------------------------------------------------------------------------ No results for input(s): VITAMINB12, FOLATE, FERRITIN, TIBC, IRON, RETICCTPCT in the last 72 hours.  Coagulation profile Recent Labs  Lab 07/16/17 1825  INR 1.38    No results for input(s): DDIMER in the last 72 hours.  Cardiac Enzymes No results for input(s): CKMB, TROPONINI, MYOGLOBIN in the last 168 hours.  Invalid input(s): CK ------------------------------------------------------------------------------------------------------------------ No results found for: BNP   Roxan Hockey M.D on 07/21/2017 at 1:15 PM  Between 7am to 7pm - Pager - [phone]  After 7pm go to [email or web address] - password [facility]  [facility] -  Office  [phone]   Voice Recognition Viviann Spare dictation system was used  to create this note, attempts have been made to correct errors. Please contact the author with questions and/or clarifications.

## 2017-07-21 NOTE — Plan of Care (Signed)
Transfer orders in place; awaiting placement.

## 2017-07-22 LAB — CBC
HEMATOCRIT: 40.1 % (ref 39.0–52.0)
HEMOGLOBIN: 13.8 g/dL (ref 13.0–17.0)
MCH: 33.1 pg (ref 26.0–34.0)
MCHC: 34.4 g/dL (ref 30.0–36.0)
MCV: 96.2 fL (ref 78.0–100.0)
Platelets: 95 10*3/uL — ABNORMAL LOW (ref 150–400)
RBC: 4.17 MIL/uL — AB (ref 4.22–5.81)
RDW: 15.1 % (ref 11.5–15.5)
WBC: 10 10*3/uL (ref 4.0–10.5)

## 2017-07-22 LAB — COMPREHENSIVE METABOLIC PANEL
ALBUMIN: 3 g/dL — AB (ref 3.5–5.0)
ALK PHOS: 173 U/L — AB (ref 38–126)
ALT: 23 U/L (ref 17–63)
AST: 44 U/L — ABNORMAL HIGH (ref 15–41)
Anion gap: 9 (ref 5–15)
BILIRUBIN TOTAL: 2.2 mg/dL — AB (ref 0.3–1.2)
BUN: 7 mg/dL (ref 6–20)
CALCIUM: 8.7 mg/dL — AB (ref 8.9–10.3)
CO2: 20 mmol/L — ABNORMAL LOW (ref 22–32)
CREATININE: 0.83 mg/dL (ref 0.61–1.24)
Chloride: 108 mmol/L (ref 101–111)
GFR calc Af Amer: >60 mL/min (ref >60)
GFR calc non Af Amer: >60 mL/min (ref >60)
GLUCOSE: 119 mg/dL — AB (ref 65–99)
Potassium: 3.3 mmol/L — ABNORMAL LOW (ref 3.5–5.1)
Sodium: 137 mmol/L (ref 135–145)
TOTAL PROTEIN: 6.9 g/dL (ref 6.5–8.1)

## 2017-07-22 MED ORDER — POTASSIUM CHLORIDE CRYS ER 20 MEQ PO TBCR
40.0000 meq | EXTENDED_RELEASE_TABLET | Freq: Once | ORAL | Status: AC
  Administered 2017-07-22: 40 meq via ORAL
  Filled 2017-07-22: qty 2

## 2017-07-22 NOTE — Evaluation (Signed)
Physical Therapy Evaluation Patient Details Name: Michael Mcfarland MRN: [id number] DOB: [birth year] Today's Date: 07/22/2017   History of Present Illness  Pt is a 52 y.o. male with PMH of alcohol abuse, alcoholic liver cirrhosis, alcohol withdrawal seizures, thrombocytopenia, tobacco abuse, GERD, stroke, depression, migraines, and panic attacks. He was hospitalized 05/2017 for hepatic encephalopathy. SNF was recommended but pt without payer source. He, therefore, opted to d/c to his sister's apartment with 24-hour assist and home health. Pt presented to the ED after a fall at home. CT revealed small SDH. Pt also presents with delerium and confusion. Family reports he returned to drinking alcohol and abusing Xanax.     Clinical Impression  Pt admitted with above diagnosis. Pt currently with functional limitations due to the deficits listed below (see PT Problem List). On eval, pt required min guard assist bed mobility, min guard assist transfers and min guard assist ambulation 200 feet with RW. Pt will benefit from skilled PT to increase their independence and safety with mobility to allow discharge to the venue listed below.  Recommend SNF due to poor safety awareness, risk for falls, and decreased caregiver assist. Pt likely to refuse due to need to pay out of pocket. If pt d/c's home, recommend resuming home health services.      Follow Up Recommendations SNF;Supervision/Assistance - 24 hour (Pt likely to refuse. Recommend resuming home health services if pt discharges home.)    Equipment Recommendations  None recommended by PT    Recommendations for Other Services       Precautions / Restrictions Precautions Precautions: Fall      Mobility  Bed Mobility Overal bed mobility: Needs Assistance Bed Mobility: Supine to Sit     Supine to sit: HOB elevated;Min guard     General bed mobility comments: increased time and effort, +rail, min guard assist for safety  Transfers Overall  transfer level: Needs assistance Equipment used: Ambulation equipment used Transfers: Sit to/from Stand;Stand Pivot Transfers Sit to Stand: Min guard Stand pivot transfers: Min guard       General transfer comment: min guard for safety, increased time to stabilize initial standing balance  Ambulation/Gait Ambulation/Gait assistance: Min guard Ambulation Distance (Feet): 200 Feet Assistive device: Rolling walker (2 wheeled) Gait Pattern/deviations: Step-through pattern;Decreased stride length Gait velocity: mildly decreased Gait velocity interpretation: 1.31 - 2.62 ft/sec, indicative of limited community ambulator General Gait Details: Pt on 2 L O2 prior to mobilizing with SpO2 93%. O2 removed. SpO2 95% on RA at rest. Desat to 88% after ambulating 200 feet. Pt with c/o dizziness and lightheadedness. 2 L O2 replaced upon return to room.   Stairs            Wheelchair Mobility    Modified Rankin (Stroke Patients Only)       Balance Overall balance assessment: Needs assistance Sitting-balance support: No upper extremity supported;Feet supported Sitting balance-Leahy Scale: Good     Standing balance support: Bilateral upper extremity supported;During functional activity Standing balance-Leahy Scale: Fair                               Pertinent Vitals/Pain Pain Assessment: No/denies pain    Home Living Family/patient expects to be discharged to:: Private residence Living Arrangements: Other relatives(sister) Available Help at Discharge: Family;Available 24 hours/day Type of Home: Apartment Home Access: Level entry     Home Layout: One level Home Equipment: Clinical cytogeneticist - 2 wheels  Prior Function Level of Independence: Needs assistance   Gait / Transfers Assistance Needed: No use of AD but balance issues with gait           Hand Dominance   Dominant Hand: Right    Extremity/Trunk Assessment   Upper Extremity Assessment Upper  Extremity Assessment: Overall WFL for tasks assessed    Lower Extremity Assessment Lower Extremity Assessment: Overall WFL for tasks assessed    Cervical / Trunk Assessment Cervical / Trunk Assessment: Normal  Communication   Communication: No difficulties  Cognition Arousal/Alertness: Awake/alert Behavior During Therapy: WFL for tasks assessed/performed(mildly agitated/easily angers) Overall Cognitive Status: No family/caregiver present to determine baseline cognitive functioning                                 General Comments: A&O x 3. Follows commands. Gets frustrated and angers easily. Uses his anger/agitation to mask confusion. Decreased safety awareness. Decreased STM.      General Comments      Exercises     Assessment/Plan    PT Assessment Patient needs continued PT services  PT Problem List Decreased mobility;Decreased safety awareness;Decreased knowledge of precautions;Decreased activity tolerance;Cardiopulmonary status limiting activity;Decreased balance;Decreased knowledge of use of DME       PT Treatment Interventions Therapeutic activities;DME instruction;Gait training;Therapeutic exercise;Patient/family education;Balance training;Stair training;Functional mobility training    PT Goals (Current goals can be found in the Care Plan section)  Acute Rehab PT Goals Patient Stated Goal: home PT Goal Formulation: With patient Time For Goal Achievement: 08/05/17 Potential to Achieve Goals: Good    Frequency Min 3X/week   Barriers to discharge Decreased caregiver support      Co-evaluation               AM-PAC PT "6 Clicks" Daily Activity  Outcome Measure Difficulty turning over in bed (including adjusting bedclothes, sheets and blankets)?: None Difficulty moving from lying on back to sitting on the side of the bed? : A Little Difficulty sitting down on and standing up from a chair with arms (e.g., wheelchair, bedside commode, etc,.)?: A  Little Help needed moving to and from a bed to chair (including a wheelchair)?: A Little Help needed walking in hospital room?: A Little Help needed climbing 3-5 steps with a railing? : A Little 6 Click Score: 19    End of Session Equipment Utilized During Treatment: Gait belt Activity Tolerance: Patient tolerated treatment well Patient left: in chair;with chair alarm set;with call bell/phone within reach Nurse Communication: Mobility status PT Visit Diagnosis: Unsteadiness on feet (R26.81);History of falling (Z91.81)    Time: 2423-5361 PT Time Calculation (min) (ACUTE ONLY): 27 min   Charges:   PT Evaluation $PT Eval Moderate Complexity: 1 Mod PT Treatments $Gait Training: 8-22 mins   PT G Codes:        Lorrin Goodell, PT  Office # [phone] Pager [phone]   Lorriane Shire 07/22/2017, 12:42 PM

## 2017-07-22 NOTE — Progress Notes (Signed)
Patient Demographics:    Michael Mcfarland, is a 52 y.o. male, DOB - 1966/03/14, PQD:[id number]  Admit date - 07/16/2017   Admitting Physician Etta Quill, DO  Outpatient Primary MD for the patient is [name], MD  LOS - 6   Chief Complaint  Patient presents with  . Altered Mental Status        Subjective:    Michael Mcfarland today has no fevers, no emesis,  No chest pain, patient continues to have had multiple  BMs with lactulose,  Assessment  & Plan :    Principal Problem:   SDH (subdural hematoma) (HCC) Active Problems:   Cirrhosis (HCC)   Thrombocytopenia (HCC)   Confusion   Alcohol withdrawal syndrome with perceptual disturbance (HCC)   Benzodiazepine abuse, episodic (HCC)  Brief Narrative:  52 year old male, lives with his sister, independent, PMH of alcohol dependence complicated by seizures, cirrhosis, esophageal varices & thrombocytopenia, anxiety, depression, emphysema, GERD, stroke, tobacco abuse, Xanax abuse, hospitalized 3/19-3/26 for acute hepatic encephalopathy, MRI brain unrevealing at that time, EGD showed grade 2 varices, SNF recommended but discharged home, since then apparently resumed drinking alcohol as well as use of Xanax.  Family concerned that he is getting his hands on alcohol and benzodiazepines and other medications such as Fioricet and removed the pill bottles from his house on Sunday PTA after he fell and hit his head.  Now presented to ED on 4/19 with increasing confusion, tremors.  Admitted for acute toxic metabolic encephalopathy, small subdural hematoma.  Neurosurgery consulted and signed off.  Ongoing withdrawal syndrome that was not adequately managed on CIWA regimen. CCM was consulted 4/21 and patient was transferred to ICU, placed on Precedex drip.  patient  weaned off Precedex drip ,  transfer out of ICU to telementry on 07/21/17 .    1)Acute toxic metabolic  encephalopathy: CT head as above.  UA negative for UTI features.  UDS negative.  BAL <10.  Ammonia level 59.  Metabolic parameters look unremarkable.  Unclear etiology.  Suspect due to Substance withdrawal (alcohol and benzo),  Delirium has resolved, patient is off Precedex drip, c/n ativan per CIWA protocol.  Nicotine patch.  Safety sitter and restraints have been removed. c/n MVI/folic acid and thiamine, patient apparently with history of previous alcohol withdrawal patient was associated with seizures at that time but none this time around   2)Alcoholic cirrhosis complicated by esophageal varices, thrombocytopenia, coagulopathy: encephalopathy/mental status has improved with lactulose,.    Increase lactulose 10 g  bid and target for 3 BMs per day.  Continue nadolol 20 mg daily,  Continue CIWA and thiamine, folic acid, continue Protonix  3)Subdural hematoma: As per neurosurgery, no surgical indications.  Repeat head CT 4/20 shows reduction in size.  Less likely for this to be because of his mental status changes.  Neurosurgery signed off.  Avoid heparin for DVT prophylaxis.  Stable without change.  4)Anxiety and depression: pt with h/o Polysubstance abuse (alcohol, tobacco, Xanax, opioids), be judicious with controlled substance, okay to continue Elavil 50 mg qhs   5)Tobacco Abuse-  Smoking cessation counseling for 4 minutes today, consider nicotine patch   Code Status : PCCM discussed with family and changed CODE STATUS to partial code on 07/18/17 (no  CPR, no chest compressions, no shocking but would like intubation and BiPAP).   Disposition Plan  : TBD, sw consult  Consults  :   Neurosurgery-signed off PCCM   DVT Prophylaxis  :  SCDs   Lab Results  Component Value Date   PLT 95 (L) 07/22/2017    Inpatient Medications  Scheduled Meds: . amitriptyline  50 mg Oral QHS  . chlorhexidine  15 mL Mouth Rinse BID  . feeding supplement (ENSURE ENLIVE)  237 mL Oral BID BM  . folic acid  1  mg Oral Daily  . lactulose  10 g Oral TID  . mouth rinse  15 mL Mouth Rinse q12n4p  . multivitamin with minerals  1 tablet Oral Daily  . nadolol  20 mg Oral Daily  . pantoprazole  40 mg Oral Q1200  . thiamine  100 mg Oral Daily   Or  . thiamine  100 mg Intravenous Daily   Continuous Infusions: . sodium chloride 50 mL (07/22/17 1254)   PRN Meds:.acetaminophen **OR** acetaminophen, haloperidol lactate, LORazepam, ondansetron **OR** ondansetron (ZOFRAN) IV    Anti-infectives (From admission, onward)   None        Objective:   Vitals:   07/22/17 0430 07/22/17 0800 07/22/17 1200 07/22/17 1549  BP: (!) 117/57 129/82 130/79 123/71  Pulse: 73 79 77 74  Resp: 20 20 20 20   Temp: 98.2 F (36.8 C) 98.9 F (37.2 C) 98.4 F (36.9 C) 98.6 F (37 C)  TempSrc: Oral Oral Oral Oral  SpO2: 93% 94% 95% 94%  Weight:      Height:        Wt Readings from Last 3 Encounters:  07/16/17 98.9 kg (218 lb)  07/08/17 98.4 kg (217 lb)  06/22/17 93.3 kg (205 lb 11.2 oz)     Intake/Output Summary (Last 24 hours) at 07/22/2017 2019 Last data filed at 07/22/2017 1700 Gross per 24 hour  Intake 960 ml  Output -  Net 960 ml     Physical Exam  Gen:- Awake Alert, in no acute distress HEENT:- [place].AT, No sclera icterus Nose- [place] 1 L/min Neck-Supple Neck,No JVD,.  Lungs-  CTAB , good air movement CV- S1, S2 normal Abd-  +ve B.Sounds, Abd Soft, No tenderness,    Extremity/Skin:- No  edema,   good pulses Psych-affect is appropriate, forgetful at times,  oriented x3 Neuro-no new focal deficits, no tremors   Data Review:   Micro Results Recent Results (from the past 240 hour(s))  MRSA PCR Screening     Status: None   Collection Time: 07/17/17 12:35 AM  Result Value Ref Range Status   MRSA by PCR NEGATIVE NEGATIVE Final    Comment:        The GeneXpert MRSA Assay (FDA approved for NASAL specimens only), is one component of a comprehensive MRSA colonization surveillance program. It is  not intended to diagnose MRSA infection nor to guide or monitor treatment for MRSA infections. Performed at [facility], [place] [address] [place], [place] [address]     Radiology Reports Ct Head Wo Contrast  Result Date: 07/17/2017 CLINICAL DATA:  Follow-up intracranial hemorrhage. EXAM: CT HEAD WITHOUT CONTRAST TECHNIQUE: Contiguous axial images were obtained from the base of the skull through the vertex without intravenous contrast. COMPARISON:  07/16/2017 FINDINGS: Brain: Subcentimeter focus of acute extra-axial hemorrhage in the left interhemispheric fissure, posterior frontal region, has slightly decreased in size and density and may be subdural or subarachnoid. No intracranial hemorrhage is identified  elsewhere. There is no evidence of acute infarct, mass/mass effect, or significant extra-axial fluid collection. There is minimal cerebral atrophy. Periventricular white matter T2 hyperintensities are nonspecific but compatible with minimal chronic small vessel ischemic disease. Vascular: Calcified atherosclerosis at the skull base. Skull: No fracture or focal osseous lesion. Sinuses/Orbits: Moderate bilateral ethmoid air cell mucosal thickening. Mild mucosal thickening in the frontal and maxillary sinuses. No sinus fluid level. Clear mastoid air cells. Unremarkable orbits. Other: None. IMPRESSION: 1. Slightly decreased size of focal subcentimeter extra-axial hemorrhage in the left interhemispheric fissure. 2. No evidence of new intracranial abnormality. Electronically Signed   By: Logan Bores M.D.   On: 07/17/2017 08:48   Ct Head Wo Contrast  Result Date: 07/16/2017 CLINICAL DATA:  Patient fell in bathroom this morning. Increasing confusion. EXAM: CT HEAD WITHOUT CONTRAST TECHNIQUE: Contiguous axial images were obtained from the base of the skull through the vertex without intravenous contrast. COMPARISON:  CT 06/15/2017 FINDINGS: Brain: Tiny left 7 x 3 x 6 mm parafalcine subdural  hematoma. No intraparenchymal hemorrhage, edema or midline shift. Chronic mild small vessel ischemic disease of periventricular white matter. No large vascular territory infarct. Midline fourth ventricle and basal cisterns. Vascular: No hyperdense vessel sign. Atherosclerosis of the cavernous carotids and both vertebral arteries. Skull: No acute skull fracture or suspicious osseous lesions. Sinuses/Orbits: Mild ethmoid, bilateral maxillary and frontal sinus mucosal thickening. Clear sphenoid sinuses and mastoids. Intact orbits and globes. Other: None IMPRESSION: 1. Acute 7 x 3 x 6 mm left parafalcine subdural hematoma. 2. Mild chronic small vessel ischemic disease of periventricular white matter. These results were called by telephone at the time of interpretation on 07/16/2017 at 8:41 pm to Dr. Lennice Sites , who verbally acknowledged these results. Electronically Signed   By: Ashley Royalty M.D.   On: 07/16/2017 20:41     CBC Recent Labs  Lab 07/16/17 1156 07/17/17 0851 07/19/17 0347 07/22/17 0242  WBC 6.9 6.6 6.5 10.0  HGB 14.7 13.5 15.1 13.8  HCT 42.6 39.9 42.5 40.1  PLT 96* 90* 89* 95*  MCV 95.7 96.1 96.4 96.2  MCH 33.0 32.5 34.2* 33.1  MCHC 34.5 33.8 35.5 34.4  RDW 15.3 15.3 15.1 15.1    Chemistries  Recent Labs  Lab 07/16/17 1156 07/17/17 0851 07/19/17 0347 07/22/17 0242  NA 137 136 135 137  K 3.9 3.9 4.0 3.3*  CL 105 105 106 108  CO2 21* 21* 19* 20*  GLUCOSE 116* 92 107* 119*  BUN 7 10 8 7   CREATININE 1.02 1.05 0.99 0.83  CALCIUM 9.6 9.0 9.1 8.7*  AST 52* 47* 48* 44*  ALT 26 21 25 23   ALKPHOS 177* 146* 158* 173*  BILITOT 2.2* 2.8* 3.9* 2.2*   ------------------------------------------------------------------------------------------------------------------ No results for input(s): CHOL, HDL, LDLCALC, TRIG, CHOLHDL, LDLDIRECT in the last 72 hours.  Lab Results  Component Value Date   HGBA1C 5.6 02/10/2017    ------------------------------------------------------------------------------------------------------------------ No results for input(s): TSH, T4TOTAL, T3FREE, THYROIDAB in the last 72 hours.  Invalid input(s): FREET3 ------------------------------------------------------------------------------------------------------------------ No results for input(s): VITAMINB12, FOLATE, FERRITIN, TIBC, IRON, RETICCTPCT in the last 72 hours.  Coagulation profile Recent Labs  Lab 07/16/17 1825  INR 1.38    No results for input(s): DDIMER in the last 72 hours.  Cardiac Enzymes No results for input(s): CKMB, TROPONINI, MYOGLOBIN in the last 168 hours.  Invalid input(s): CK ------------------------------------------------------------------------------------------------------------------ No results found for: BNP   Roxan Hockey M.D on 07/22/2017 at 8:19 PM  Between 7am to 7pm -  Pager - [phone]  After 7pm go to [email or web address] - password [email or web address]  [facility] -  Office  [phone]   Voice Recognition Viviann Spare dictation system was used to create this note, attempts have been made to correct errors. Please contact the author with questions and/or clarifications.

## 2017-07-23 LAB — GLUCOSE, CAPILLARY
Glucose-Capillary: 105 mg/dL — ABNORMAL HIGH (ref 65–99)
Glucose-Capillary: 120 mg/dL — ABNORMAL HIGH (ref 65–99)
Glucose-Capillary: 98 mg/dL (ref 65–99)

## 2017-07-23 LAB — BASIC METABOLIC PANEL
ANION GAP: 8 (ref 5–15)
BUN: 8 mg/dL (ref 6–20)
CHLORIDE: 107 mmol/L (ref 101–111)
CO2: 20 mmol/L — AB (ref 22–32)
Calcium: 8.5 mg/dL — ABNORMAL LOW (ref 8.9–10.3)
Creatinine, Ser: 0.79 mg/dL (ref 0.61–1.24)
GFR calc non Af Amer: >60 mL/min (ref >60)
Glucose, Bld: 108 mg/dL — ABNORMAL HIGH (ref 65–99)
POTASSIUM: 3.7 mmol/L (ref 3.5–5.1)
Sodium: 135 mmol/L (ref 135–145)

## 2017-07-23 MED ORDER — POTASSIUM CHLORIDE CRYS ER 20 MEQ PO TBCR
40.0000 meq | EXTENDED_RELEASE_TABLET | Freq: Once | ORAL | Status: AC
  Administered 2017-07-23: 40 meq via ORAL
  Filled 2017-07-23: qty 2

## 2017-07-23 MED ORDER — LACTULOSE 10 GM/15ML PO SOLN
10.0000 g | Freq: Every day | ORAL | Status: DC
  Administered 2017-07-24: 10 g via ORAL
  Filled 2017-07-23: qty 30

## 2017-07-23 MED ORDER — LACTULOSE 10 GM/15ML PO SOLN
10.0000 g | Freq: Two times a day (BID) | ORAL | Status: DC
  Administered 2017-07-23: 10 g via ORAL
  Filled 2017-07-23: qty 30

## 2017-07-23 NOTE — Progress Notes (Signed)
Patient Demographics:    Michael Mcfarland, is a 52 y.o. male, DOB - [birth year], TMH:[id number]  Admit date - 07/16/2017   Admitting Physician Etta Quill, DO  Outpatient Primary MD for the patient is [name], MD  LOS - 7   Chief Complaint  Patient presents with  . Altered Mental Status        Subjective:    [name] today has no fevers, no emesis,  No chest pain, patient continues to have had multiple  BMs with lactulose,  Assessment  & Plan :    Principal Problem:   SDH (subdural hematoma) (HCC) Active Problems:   Cirrhosis (HCC)   Thrombocytopenia (HCC)   Confusion   Alcohol withdrawal syndrome with perceptual disturbance (HCC)   Benzodiazepine abuse, episodic (HCC)  Brief Narrative:  52 year old male, lives with his sister, independent, PMH of alcohol dependence complicated by seizures, cirrhosis, esophageal varices & thrombocytopenia, anxiety, depression, emphysema, GERD, stroke, tobacco abuse, Xanax abuse, hospitalized 3/19-3/26 for acute hepatic encephalopathy, MRI brain unrevealing at that time, EGD showed grade 2 varices, SNF recommended but discharged home, since then apparently resumed drinking alcohol as well as use of Xanax.  Family concerned that he is getting his hands on alcohol and benzodiazepines and other medications such as Fioricet and removed the pill bottles from his house on Sunday PTA after he fell and hit his head.  Now presented to ED on 4/19 with increasing confusion, tremors.  Admitted for acute toxic metabolic encephalopathy, small subdural hematoma.  Neurosurgery consulted and signed off.  Ongoing withdrawal syndrome that was not adequately managed on CIWA regimen. CCM was consulted 4/21 and patient was transferred to ICU, placed on Precedex drip.  patient  weaned off Precedex drip ,  transfer out of ICU to telementry on 07/21/17 .   Plan:- 1)Acute Toxic  Metabolic Encephalopathy:   CT head as above.  UA negative for UTI features.  UDS negative.  BAL <10.  Ammonia level 59.  Metabolic parameters look unremarkable.  Unclear etiology.  Suspect due to Substance withdrawal (alcohol and benzo),  Delirium has resolved, mental status has improved significantly, patient continues to be at times forgetful, patient is off Precedex drip, c/n ativan per CIWA protocol.  Nicotine patch.  Safety sitter and restraints have been removed. c/n MVI/folic acid and thiamine, patient apparently with history of previous alcohol withdrawal patient was associated with seizures at that time but none this time around  2)Alcoholic cirrhosis complicated by esophageal varices, thrombocytopenia, coagulopathy: encephalopathy/mental status has improved with lactulose,.    Increase lactulose 10 g  bid and target for 3 BMs per day.  Continue nadolol 20 mg daily,  Continue CIWA and thiamine, folic acid, continue Protonix  3)Subdural hematoma: As per neurosurgery, no surgical indications.  Repeat head CT 4/20 shows reduction in size.  Less likely for this to be because of his mental status changes.  Neurosurgery signed off.  Avoid heparin for DVT prophylaxis.  Stable without change.  4)Anxiety and depression: pt with h/o Polysubstance abuse (alcohol, tobacco, Xanax, opioids), be judicious with controlled substance, okay to continue Elavil 50 mg qhs   5)Tobacco Abuse-  Smoking cessation counseling for 4 minutes today, consider nicotine patch  6)Disposition-plan discussed with patient, family  members and case manager, possible discharge home with home health services on 07/24/2017  Code Status : PCCM discussed with family and changed CODE STATUS to partial code on 07/18/17 (no CPR, no chest compressions, no shocking but would like intubation and BiPAP).   Consults  :   Neurosurgery-signed off PCCM   DVT Prophylaxis  :  SCDs   Lab Results  Component Value Date   PLT 95 (L) 07/22/2017      Inpatient Medications  Scheduled Meds: . amitriptyline  50 mg Oral QHS  . chlorhexidine  15 mL Mouth Rinse BID  . feeding supplement (ENSURE ENLIVE)  237 mL Oral BID BM  . folic acid  1 mg Oral Daily  . [START ON 07/24/2017] lactulose  10 g Oral Daily  . mouth rinse  15 mL Mouth Rinse q12n4p  . multivitamin with minerals  1 tablet Oral Daily  . nadolol  20 mg Oral Daily  . pantoprazole  40 mg Oral Q1200  . potassium chloride  40 mEq Oral Once  . thiamine  100 mg Oral Daily   Or  . thiamine  100 mg Intravenous Daily   Continuous Infusions: . sodium chloride 50 mL (07/22/17 1254)   PRN Meds:.acetaminophen **OR** acetaminophen, haloperidol lactate, LORazepam, ondansetron **OR** ondansetron (ZOFRAN) IV    Anti-infectives (From admission, onward)   None        Objective:   Vitals:   07/23/17 0400 07/23/17 0851 07/23/17 1252 07/23/17 1653  BP: 134/72 (!) 142/88 133/85 (!) 143/84  Pulse: 75 88 74 76  Resp: 18 18 20 20   Temp: 97.7 F (36.5 C) 98.2 F (36.8 C) 98.8 F (37.1 C) 99 F (37.2 C)  TempSrc: Oral  Oral Oral  SpO2: 96% 94% 95% 98%  Weight:      Height:        Wt Readings from Last 3 Encounters:  07/16/17 98.9 kg (218 lb)  07/08/17 98.4 kg (217 lb)  06/22/17 93.3 kg (205 lb 11.2 oz)     Intake/Output Summary (Last 24 hours) at 07/23/2017 1900 Last data filed at 07/23/2017 0830 Gross per 24 hour  Intake 240 ml  Output -  Net 240 ml     Physical Exam  Gen:- Awake Alert, in no acute distress HEENT:- [place].AT, No sclera icterus Nose- [place] 1 L/min Neck-Supple Neck,No JVD,.  Lungs-  CTAB , good air movement CV- S1, S2 normal Abd-  +ve B.Sounds, Abd Soft, No tenderness,    Extremity/Skin:- No  edema,   good pulses Psych-affect is appropriate, forgetful at times,  oriented x3 Neuro-no new focal deficits, no tremors   Data Review:   Micro Results Recent Results (from the past 240 hour(s))  MRSA PCR Screening     Status: None   Collection Time:  07/17/17 12:35 AM  Result Value Ref Range Status   MRSA by PCR NEGATIVE NEGATIVE Final    Comment:        The GeneXpert MRSA Assay (FDA approved for NASAL specimens only), is one component of a comprehensive MRSA colonization surveillance program. It is not intended to diagnose MRSA infection nor to guide or monitor treatment for MRSA infections. Performed at [facility], [place] [address], [place], [place] [address]     Radiology Reports Ct Head Wo Contrast  Result Date: 07/17/2017 CLINICAL DATA:  Follow-up intracranial hemorrhage. EXAM: CT HEAD WITHOUT CONTRAST TECHNIQUE: Contiguous axial images were obtained from the base of the skull through the vertex without intravenous contrast.  COMPARISON:  07/16/2017 FINDINGS: Brain: Subcentimeter focus of acute extra-axial hemorrhage in the left interhemispheric fissure, posterior frontal region, has slightly decreased in size and density and may be subdural or subarachnoid. No intracranial hemorrhage is identified elsewhere. There is no evidence of acute infarct, mass/mass effect, or significant extra-axial fluid collection. There is minimal cerebral atrophy. Periventricular white matter T2 hyperintensities are nonspecific but compatible with minimal chronic small vessel ischemic disease. Vascular: Calcified atherosclerosis at the skull base. Skull: No fracture or focal osseous lesion. Sinuses/Orbits: Moderate bilateral ethmoid air cell mucosal thickening. Mild mucosal thickening in the frontal and maxillary sinuses. No sinus fluid level. Clear mastoid air cells. Unremarkable orbits. Other: None. IMPRESSION: 1. Slightly decreased size of focal subcentimeter extra-axial hemorrhage in the left interhemispheric fissure. 2. No evidence of new intracranial abnormality. Electronically Signed   By: Logan Bores M.D.   On: 07/17/2017 08:48   Ct Head Wo Contrast  Result Date: 07/16/2017 CLINICAL DATA:  Patient fell in bathroom this morning. Increasing  confusion. EXAM: CT HEAD WITHOUT CONTRAST TECHNIQUE: Contiguous axial images were obtained from the base of the skull through the vertex without intravenous contrast. COMPARISON:  CT 06/15/2017 FINDINGS: Brain: Tiny left 7 x 3 x 6 mm parafalcine subdural hematoma. No intraparenchymal hemorrhage, edema or midline shift. Chronic mild small vessel ischemic disease of periventricular white matter. No large vascular territory infarct. Midline fourth ventricle and basal cisterns. Vascular: No hyperdense vessel sign. Atherosclerosis of the cavernous carotids and both vertebral arteries. Skull: No acute skull fracture or suspicious osseous lesions. Sinuses/Orbits: Mild ethmoid, bilateral maxillary and frontal sinus mucosal thickening. Clear sphenoid sinuses and mastoids. Intact orbits and globes. Other: None IMPRESSION: 1. Acute 7 x 3 x 6 mm left parafalcine subdural hematoma. 2. Mild chronic small vessel ischemic disease of periventricular white matter. These results were called by telephone at the time of interpretation on 07/16/2017 at 8:41 pm to Dr. Lennice Sites , who verbally acknowledged these results. Electronically Signed   By: Ashley Royalty M.D.   On: 07/16/2017 20:41     CBC Recent Labs  Lab 07/17/17 0851 07/19/17 0347 07/22/17 0242  WBC 6.6 6.5 10.0  HGB 13.5 15.1 13.8  HCT 39.9 42.5 40.1  PLT 90* 89* 95*  MCV 96.1 96.4 96.2  MCH 32.5 34.2* 33.1  MCHC 33.8 35.5 34.4  RDW 15.3 15.1 15.1    Chemistries  Recent Labs  Lab 07/17/17 0851 07/19/17 0347 07/22/17 0242 07/23/17 0351  NA 136 135 137 135  K 3.9 4.0 3.3* 3.7  CL 105 106 108 107  CO2 21* 19* 20* 20*  GLUCOSE 92 107* 119* 108*  BUN 10 8 7 8   CREATININE 1.05 0.99 0.83 0.79  CALCIUM 9.0 9.1 8.7* 8.5*  AST 47* 48* 44*  --   ALT 21 25 23   --   ALKPHOS 146* 158* 173*  --   BILITOT 2.8* 3.9* 2.2*  --    ------------------------------------------------------------------------------------------------------------------ No results  for input(s): CHOL, HDL, LDLCALC, TRIG, CHOLHDL, LDLDIRECT in the last 72 hours.  Lab Results  Component Value Date   HGBA1C 5.6 02/10/2017   ------------------------------------------------------------------------------------------------------------------ No results for input(s): TSH, T4TOTAL, T3FREE, THYROIDAB in the last 72 hours.  Invalid input(s): FREET3 ------------------------------------------------------------------------------------------------------------------ No results for input(s): VITAMINB12, FOLATE, FERRITIN, TIBC, IRON, RETICCTPCT in the last 72 hours.  Coagulation profile No results for input(s): INR, PROTIME in the last 168 hours.  No results for input(s): DDIMER in the last 72 hours.  Cardiac Enzymes No results for  input(s): CKMB, TROPONINI, MYOGLOBIN in the last 168 hours.  Invalid input(s): CK ------------------------------------------------------------------------------------------------------------------ No results found for: BNP   Roxan Hockey M.D on 07/23/2017 at 7:00 PM  Between 7am to 7pm - Pager - [phone]  After 7pm go to [email or web address] - password [email or web address]  [facility] -  Office  [phone]   Voice Recognition Viviann Spare dictation system was used to create this note, attempts have been made to correct errors. Please contact the author with questions and/or clarifications.

## 2017-07-23 NOTE — Clinical Social Work Note (Signed)
Pt is walking 200 feet, we will not do a LOG. RNCM notified. Clinical Social Worker will sign off for now as social work intervention is no longer needed. Please consult [place] again if new need arises.   Shelton Silvas A Saskia Simerson 07/23/2017

## 2017-07-23 NOTE — Care Management Note (Signed)
Case Management Note  Patient Details  Name: Michael Mcfarland MRN: [id number] Date of Birth: November 10, 1965  Subjective/Objective:                    Action/Plan: Pt doing to well to qualify for SNF rehab under an LOG per CSW. CM met with the patient and spoke to his sister, Michael Mcfarland, over the phone and they are agreeable to patient discharging to [facility] home with resumption of [facility] services. MD updated.  Pt was active with [facility] for charity [facility] services prior to admission. Butch Penny with [facility] notified of new [place] orders.  Michael Mcfarland states she will provide transportation home when patient is medically ready for d/c.   Expected Discharge Date:                  Expected Discharge Plan:  Skilled Nursing Facility  In-House Referral:  Chaplain  Discharge planning Services  CM Consult  Post Acute Care Choice:    Choice offered to:  Patient, Sibling  DME Arranged:    DME Agency:     HH Arranged:  RN, PT, Social Work [employer] Agency:  [facility](charity services)  Status of Service:  Completed, signed off  If discussed at [employer], dates discussed:    Additional Comments:  Pollie Friar, RN 07/23/2017, 11:45 AM

## 2017-07-24 LAB — COMPREHENSIVE METABOLIC PANEL
ALK PHOS: 166 U/L — AB (ref 38–126)
ALT: 21 U/L (ref 17–63)
AST: 36 U/L (ref 15–41)
Albumin: 2.8 g/dL — ABNORMAL LOW (ref 3.5–5.0)
Anion gap: 10 (ref 5–15)
BUN: 7 mg/dL (ref 6–20)
CALCIUM: 8.8 mg/dL — AB (ref 8.9–10.3)
CHLORIDE: 101 mmol/L (ref 101–111)
CO2: 21 mmol/L — AB (ref 22–32)
CREATININE: 0.78 mg/dL (ref 0.61–1.24)
GFR calc Af Amer: >60 mL/min (ref >60)
GFR calc non Af Amer: >60 mL/min (ref >60)
Glucose, Bld: 104 mg/dL — ABNORMAL HIGH (ref 65–99)
Potassium: 4.1 mmol/L (ref 3.5–5.1)
SODIUM: 132 mmol/L — AB (ref 135–145)
Total Bilirubin: 2 mg/dL — ABNORMAL HIGH (ref 0.3–1.2)
Total Protein: 6.6 g/dL (ref 6.5–8.1)

## 2017-07-24 LAB — GLUCOSE, CAPILLARY: Glucose-Capillary: 101 mg/dL — ABNORMAL HIGH (ref 65–99)

## 2017-07-24 MED ORDER — LACTULOSE 10 GM/15ML PO SOLN: ORAL | 5 refills | Status: DC

## 2017-07-24 MED ORDER — ONDANSETRON HCL 4 MG PO TABS: 4.0000 mg | ORAL_TABLET | Freq: Four times a day (QID) | ORAL | 0 refills | Status: DC | PRN

## 2017-07-24 MED ORDER — FOLIC ACID 1 MG PO TABS: 1.0000 mg | ORAL_TABLET | Freq: Every day | ORAL | 5 refills | Status: AC

## 2017-07-24 MED ORDER — THIAMINE HCL 100 MG PO TABS: 100.0000 mg | ORAL_TABLET | Freq: Every day | ORAL | 5 refills | Status: DC

## 2017-07-24 MED ORDER — ADULT MULTIVITAMIN W/MINERALS CH: 1.0000 | ORAL_TABLET | Freq: Every day | ORAL | 5 refills | Status: AC

## 2017-07-24 MED ORDER — NADOLOL 20 MG PO TABS: 20.0000 mg | ORAL_TABLET | Freq: Every day | ORAL | 3 refills | Status: DC

## 2017-07-24 MED ORDER — AMITRIPTYLINE HCL 50 MG PO TABS: 50.0000 mg | ORAL_TABLET | Freq: Every day | ORAL | 3 refills | Status: DC

## 2017-07-24 NOTE — Discharge Instructions (Signed)
1)Take enough lactulose to have 2-3 soft bowel movements each day 2)Absolutely no alcohol 3)Take all other medications including multivitamin as prescribed 4)You will get home health physical therapy, Nurse and Social Worker at home. 5)Avoid ibuprofen/Advil/Aleve/Motrin/Goody Powders/Naproxen/BC powders/Meloxicam/Diclofenac/Indomethacin and other Nonsteroidal anti-inflammatory medications as these will make you more likely to bleed and can cause stomach ulcers, can also cause Kidney problems.    Subdural Hematoma A subdural hematoma is a collection of blood between the brain and its tough outer covering (dura). As the amount of blood increases, it puts pressure on the brain. There are two types of subdural hematomas:  Acute. This type develops shortly after a hard, direct hit (blow) to the head and causes blood to collect very quickly. This is a medical emergency. If it is not diagnosed and treated quickly, it can lead to severe brain injury or death.  Chronic. This is when bleeding develops more slowly, over weeks or months.  What are the causes? This condition is caused by bleeding (hemorrhage) from a broken (ruptured) blood vessel. In most cases, a blood vessel ruptures and bleeds because of injury (trauma) to the head, such as from a hard, direct hit. Head trauma can happen in:  Traffic accidents.  Falls.  Assaults.  Sport injuries.  In rare cases, hemorrhage can happen without a known cause (spontaneously), especially if you take blood thinners (anticoagulants). What increases the risk? This condition is more likely to develop in:  Older people.  Infants.  People who take blood thinners.  People who have injured their head.  People who abuse alcohol.  What are the signs or symptoms? Depending on the size of the hematoma, symptoms can vary from mild to severe and life-threatening. Symptoms in acute subdural hematoma can develop over minutes or hours. Symptoms in chronic  subdural hematoma may develop over weeks or months.  Headaches.  Nausea or vomiting.  Changes in vision, such as double vision or loss of vision.  Changes in speech.  Loss of balance or difficulty walking.  Weakness, numbness, or tingling in the arms or legs on one side of the body.  Jerky movements that you cannot control (seizures).  Change in personality.  Increased sleepiness.  Memory loss.  Loss of consciousness.  Coma.  How is this diagnosed? This condition is diagnosed based on the results of:  A physical and neurological exam.  CT scan.  MRI.  How is this treated? Treatment for this condition depends on the severity and the type of subdural hematoma that you have. You may need to temporarily stop taking blood thinners, if this applies. You may be given antiseizure (anticonvulsant) medicine. Treatment for acute subdural hematoma may include:  Medicines that help the body get rid of excess fluids (diuretics). These may help reduce pressure in the brain.  Assisted breathing (ventilation). This involves using a machine called a ventilator to help you breathe. This helps to reduce pressure in the brain, especially if there is swelling of the brain.  Emergency surgery to drain blood or remove a blood clot.  Treatment for chronic subdural hematoma may include:  Observation and bed rest at the hospital.  Emergency surgery. This may be done if the bleeding is large, or if you have neurological symptoms such as weakness or numbness.  Sometimes, no treatment is needed for chronic subdural hematoma. Follow these instructions at home: Activity  Avoid any situation where there is potential for another head injury, such as football, hockey, soccer, basketball, martial arts, downhill snow sports,  and horseback riding. Do not do these activities until your health care provider approves. ? If you play a contact sport and you experience a head injury, follow advice from  your health care provider about when you can return to the sport. If you get another injury while you are healing, you may experience another hemorrhage.  Avoid excessive visual stimulation while recovering. This includes working on the computer, watching TV, and reading.  Try to avoid activities that cause physical or mental stress. Stay home from work or school as directed by your health care provider.  Do not drive, ride a bicycle, or use heavy machinery until your health care provider approves.  Do not lift anything that is heavier than 5 lb (2.3 kg) until your health care provider approves.  If physical therapy was prescribed, do exercises as told by your health care provider or physical therapist.  Rest as told by your health care provider. Rest helps the brain to heal.  Make sure you: ? Get plenty of sleep. Avoid staying up late at night. ? Keep a consistent sleep schedule. Try to go to sleep and wake up at about the same time every day. General instructions  Recovery from brain injuries varies widely. Talk with your health care provider about what to expect. Monitor your symptoms, and ask people around you to do the same.  Take over-the-counter and prescription medicines only as told by your health care provider. Do not take blood thinners or NSAIDs unless your health care provider approves. This includes aspirin, ibuprofen, naproxen, and warfarin.  Limit alcohol intake to no more than 1 drink per day for nonpregnant women and 2 drinks per day for men. One drink equals 12 oz of beer, 5 oz of wine, or 1 oz of hard liquor.  Keep all follow-up visits as told by your health care provider. This is important. How is this prevented?  Wear protective gear, such as helmets, when participating in activities such as biking or contact sports.  Always wear a seat belt when you are in a motor vehicle.  Keep your home environment safe to reduce the risk of falling: ? Remove clutter and  tripping hazards from floors and stairways, such as loose rugs and extension cords. ? Use grab bars in bathrooms and handrails by stairs. ? Place non-slip mats on floors and in bathtubs. ? Improve lighting in dim areas. Where to find more information:  [employer] of Neurological Disorders and Stroke: MasterBoxes.it  American Association of Neurological Surgeons: http://www.aans.org  American Academy of Neurology (AAN): http://keith.biz/  Brain Injury Association of America: www.biausa.org Get help right away if:  You develop symptoms of subdural hematoma.  You are taking blood thinners and you fall or you experience minor trauma to the head. If you take any blood thinners, even a very small injury can cause a subdural hematoma. You should get help right away, even if you think your symptoms are mild.  You have a bleeding disorder and you fall or you experience minor trauma to the head.  You experience a head injury and you develop any of the following symptoms: ? Clear fluid draining from your nose or ears. ? Nausea. ? Vomiting. ? Slurred speech. ? Seizures. ? Drowsiness or a decrease in alertness. ? Double vision. ? Numbness or inability to move (paralysis) in any part of your body. ? Difficulty walking or poor coordination. ? Difficulty thinking. ? Confusion or forgetfulness. ? Personality changes. ? Irrational or aggressive behavior. ? A  history of heavy alcohol use. These symptoms may represent a serious problem that is an emergency. Do not wait to see if the symptoms will go away. Get medical help right away. Call your local emergency services (911 in the [place] Do not drive yourself to the hospital. Summary  A subdural hematoma is a collection of blood between the brain and its tough outer covering.  Treatment for this condition depends on the severity and the type of subdural hemorrhage that you have.  Symptoms can vary from mild to severe and  life-threatening.  Monitor your symptoms, and ask others around you to do the same. This information is not intended to replace advice given to you by your health care provider. Make sure you discuss any questions you have with your health care provider. Document Released: 02/01/2004 Document Revised: 02/19/2016 Document Reviewed: 02/19/2016 Elsevier Interactive Patient Education  2018 [employer].    1)Take enough lactulose to have 2-3 soft bowel movements each day 2)Absolutely no alcohol 3)Take all other medications including multivitamin as prescribed 4)You will get home health physical therapy, Nurse and Education officer, museum at home. 5)Avoid ibuprofen/Advil/Aleve/Motrin/Goody Powders/Naproxen/BC powders/Meloxicam/Diclofenac/Indomethacin and other Nonsteroidal anti-inflammatory medications as these will make you more likely to bleed and can cause stomach ulcers, can also cause Kidney problems.

## 2017-07-24 NOTE — Plan of Care (Signed)
Pt able to verbalize to MD what to do on discharge and for self care in home. Pt verbalizes plan for home care.

## 2017-07-24 NOTE — Discharge Summary (Signed)
Michael Mcfarland, is a 52 y.o. male  DOB 1965/11/22  MRN [id number].  Admission date:  07/16/2017  Admitting Physician  Etta Quill, DO  Discharge Date:  07/24/2017   Primary MD  [name], MD  Recommendations for primary care physician for things to follow:   1)Take enough lactulose to have 2-3 soft bowel movements each day 2)Absolutely no alcohol 3)Take all other medications including multivitamin as prescribed 4)You will get home health physical therapy, Nurse and Social Worker at home.  Admission Diagnosis  SDH (subdural hematoma) ([place]) [S06.5X9A] Altered mental status, unspecified altered mental status type [R41.82]   Discharge Diagnosis  SDH (subdural hematoma) ([place]) [S06.5X9A] Altered mental status, unspecified altered mental status type [R41.82]   Principal Problem:   SDH (subdural hematoma) (HCC) Active Problems:   Cirrhosis (HCC)   Thrombocytopenia (HCC)   Confusion   Alcohol withdrawal syndrome with perceptual disturbance (HCC)   Benzodiazepine abuse, episodic (HCC)      Past Medical History:  Diagnosis Date  . Alcohol abuse    /notes 01/18/2017  . Alcohol related seizure ([place]) 01/17/2017   Archie Endo 01/17/2017  . Anxiety   . Arthritis   . Cirrhosis, alcoholic ([place]) 05/8331  . Depression   . Emphysema lung ([place])   . Frequent headaches   . GERD (gastroesophageal reflux disease)   . Hay fever   . Hepatitis B   . Migraine    frequency "depends on the weather" (01/19/2017)  . Panic attacks   . Pneumonia   . Portal hypertensive gastropathy ([place])   . Seizure ([place]) 05/20/2017 X 2  . Stroke [facility])     Past Surgical History:  Procedure Laterality Date  . ANTERIOR CERVICAL DECOMP/DISCECTOMY FUSION    . BACK SURGERY    . ESOPHAGOGASTRODUODENOSCOPY N/A 06/18/2017   Procedure: ESOPHAGOGASTRODUODENOSCOPY (EGD);  Surgeon: Doran Stabler, MD;  Location: [place];   Service: Gastroenterology;  Laterality: N/A;     HPI  from the history and physical done on the day of admission:    Chief Complaint: AMS  HPI: Michael Mcfarland is a 52 y.o. male with medical history significant of EtOH cirrhosis, EtOH abuse, GERD, migraines.  Patient recently admitted to our service last month with encephalopathy.  Believed to be hetpatic encephalopathy.  Ammonia initially in the 50s, got lactulose, improved to 30s and patients mental status improved.  Recd SNF but patient didn't want to pay so went home with home health.  Since then it seems that patient resumed drinking last week as well as use of Xanax.  Family concerned that he is getting his hands on alcohol and benzodiazepines and other medications like his Fioricet.  Family removed all the pill bottles from the house on Sunday after he fell and hit his head.  Lives with sister.  For the last few days has had increasing mild confusion. Had tremors over past couple of days but not today. Today had severe confusion and is talking nonsense.  Brought in to ED by  family.   ED Course: EtOH neg.  UDS neg.  Ammonia upper end of normal.  CT head shows small acute para falcine SDH   Hospital Course:    Brief Narrative: 52 year old male, lives with his sister, independent, PMH of alcohol dependence complicated by seizures, cirrhosis, esophageal varices &thrombocytopenia, anxiety, depression, emphysema, GERD, stroke, tobacco abuse, Xanax abuse, hospitalized 3/19-3/26 for acute hepatic encephalopathy, MRI brain unrevealing at that time, EGD showed grade 2 varices, SNF recommended but discharged home, since then apparently resumed drinking alcohol as well as use of Xanax. Family concerned that he is getting his hands on alcohol and benzodiazepines and other medications such as Fioricet and removed the pill bottles from his house on Sunday PTA after he fell and hit his head. Now presented to ED on 4/19 with increasing  confusion, tremors. Admitted for acute toxic metabolic encephalopathy, small subdural hematoma. Neurosurgery consulted and signed off. Ongoing withdrawal syndrome that was not adequately managed on CIWA regimen. CCM was consulted 4/21 and patient was transferred to ICU, placed on Precedex drip.  patient  weaned off Precedex drip ,  transfer out of ICU to telementry on 07/21/17 .   Plan:- 1)Acute Toxic Metabolic Encephalopathy:  CT head as above.  Resolved mental status changes, patient is coherent at this time, UA negative for UTI features. UDS negative. BAL <10. Ammonia level 59 prior to treatment with lactulose. Metabolic parameters look unremarkable.   Suspect due toSubstance withdrawal (alcohol and benzo),  Delirium has resolved,  patient has been  off Precedex drip, c/n ativan per CIWA protocol. Nicotine patch. Safety sitter and restraints have been removed for several days now. D/c home on  MVI/folic acid and thiamine, patient apparently with history of previous alcohol withdrawal patient was associated with seizures at that time but none this time around  2)Alcoholic cirrhosis complicated by esophageal varices, thrombocytopenia, coagulopathy: encephalopathy/mental status has improved with lactulose, use lactulose to  target for 2 to 3 BMs per day. Continue nadolol 20 mg daily, Continue CIWA and thiamine, folic acid, continue Protonix  3)Subdural hematoma: As per neurosurgery, no surgical indications. Repeat head CT 07/17/17 shows reduction in size. Less likely for this to be the cause of his mental status changes. Neurosurgery signed off. Avoid Nsaids, Stable without change.  4)Anxiety and depression:pt with h/o Polysubstance abuse (alcohol, tobacco, Xanax, opioids), abstinence from controlled substances advised,, okay to continue Elavil 50 mg qhs   5)Tobacco Abuse-  Smoking cessation advised  6)Disposition-plan discussed with patient, family members and case manager,  discharge home with home health services on 07/24/2017  Code Status : PCCM discussed with family and changed CODE STATUS to partial code on 07/18/17 (no CPR, no chest compressions, no shocking but would like intubation and BiPAP).  Consults  :   Neurosurgery-signed off PCCM  Discharge Condition: stable, discharged with home health services including social worker, physical therapy and RN  Follow UP--  Diet and Activity recommendation:  As advised  Discharge Instructions    Discharge Instructions    Call MD for:  difficulty breathing, headache or visual disturbances   Complete by:  As directed    Call MD for:  difficulty breathing, headache or visual disturbances   Complete by:  As directed    Call MD for:  persistant dizziness or light-headedness   Complete by:  As directed    Call MD for:  persistant dizziness or light-headedness   Complete by:  As directed    Call MD for:  persistant nausea and  vomiting   Complete by:  As directed    Call MD for:  persistant nausea and vomiting   Complete by:  As directed    Call MD for:  severe uncontrolled pain   Complete by:  As directed    Call MD for:  severe uncontrolled pain   Complete by:  As directed    Call MD for:  temperature >100.4   Complete by:  As directed    Call MD for:  temperature >100.4   Complete by:  As directed    Diet - low sodium heart healthy   Complete by:  As directed    Discharge instructions   Complete by:  As directed    1)Take enough lactulose to have 2-3 soft bowel movements each day 2)Absolutely no alcohol 3)Take all other medications including multivitamin as prescribed 4)You will get home health physical therapy, Nurse and Social Worker at home. 5)Avoid ibuprofen/Advil/Aleve/Motrin/Goody Powders/Naproxen/BC powders/Meloxicam/Diclofenac/Indomethacin and other Nonsteroidal anti-inflammatory medications as these will make you more likely to bleed and can cause stomach ulcers, can also cause Kidney  problems.   Increase activity slowly   Complete by:  As directed        Discharge Medications     Allergies as of 07/24/2017   No Known Allergies     Medication List    STOP taking these medications   diclofenac 75 MG EC tablet Commonly known as:  VOLTAREN     TAKE these medications   amitriptyline 50 MG tablet Commonly known as:  ELAVIL Take 1 tablet (50 mg total) by mouth at bedtime.   butalbital-acetaminophen-caffeine 50-325-40 MG tablet Commonly known as:  FIORICET, ESGIC Take 1-2 tablets by mouth daily as needed for headache.   feeding supplement (ENSURE ENLIVE) Liqd Take 237 mLs by mouth 2 (two) times daily between meals.   folic acid 1 MG tablet Commonly known as:  FOLVITE Take 1 tablet (1 mg total) by mouth daily. What changed:  Another medication with the same name was removed. Continue taking this medication, and follow the directions you see here.   lactulose 10 GM/15ML solution Commonly known as:  CHRONULAC Take 51mL one to three times daily to titrate for 2-3 soft stools per day What changed:    how much to take  how to take this  when to take this  additional instructions   multivitamin with minerals Tabs tablet Take 1 tablet by mouth daily. Start taking on:  07/25/2017   nadolol 20 MG tablet Commonly known as:  CORGARD Take 1 tablet (20 mg total) by mouth daily.   nicotine 21 mg/24hr patch Commonly known as:  NICODERM CQ - dosed in mg/24 hours Place 1 patch (21 mg total) onto the skin daily.   ondansetron 4 MG tablet Commonly known as:  ZOFRAN Take 1 tablet (4 mg total) by mouth every 6 (six) hours as needed for nausea.   pantoprazole 40 MG tablet Commonly known as:  PROTONIX Take 1 tablet (40 mg total) by mouth daily at 12 noon.   thiamine 100 MG tablet Take 1 tablet (100 mg total) by mouth daily.   zolpidem 5 MG tablet Commonly known as:  AMBIEN Take 5 mg by mouth at bedtime as needed for sleep.      Major procedures and  Radiology Reports - PLEASE review detailed and final reports for all details, in brief -   Ct Head Wo Contrast  Result Date: 07/17/2017 CLINICAL DATA:  Follow-up intracranial hemorrhage. EXAM: CT HEAD  WITHOUT CONTRAST TECHNIQUE: Contiguous axial images were obtained from the base of the skull through the vertex without intravenous contrast. COMPARISON:  07/16/2017 FINDINGS: Brain: Subcentimeter focus of acute extra-axial hemorrhage in the left interhemispheric fissure, posterior frontal region, has slightly decreased in size and density and may be subdural or subarachnoid. No intracranial hemorrhage is identified elsewhere. There is no evidence of acute infarct, mass/mass effect, or significant extra-axial fluid collection. There is minimal cerebral atrophy. Periventricular white matter T2 hyperintensities are nonspecific but compatible with minimal chronic small vessel ischemic disease. Vascular: Calcified atherosclerosis at the skull base. Skull: No fracture or focal osseous lesion. Sinuses/Orbits: Moderate bilateral ethmoid air cell mucosal thickening. Mild mucosal thickening in the frontal and maxillary sinuses. No sinus fluid level. Clear mastoid air cells. Unremarkable orbits. Other: None. IMPRESSION: 1. Slightly decreased size of focal subcentimeter extra-axial hemorrhage in the left interhemispheric fissure. 2. No evidence of new intracranial abnormality. Electronically Signed   By: Logan Bores M.D.   On: 07/17/2017 08:48   Ct Head Wo Contrast  Result Date: 07/16/2017 CLINICAL DATA:  Patient fell in bathroom this morning. Increasing confusion. EXAM: CT HEAD WITHOUT CONTRAST TECHNIQUE: Contiguous axial images were obtained from the base of the skull through the vertex without intravenous contrast. COMPARISON:  CT 06/15/2017 FINDINGS: Brain: Tiny left 7 x 3 x 6 mm parafalcine subdural hematoma. No intraparenchymal hemorrhage, edema or midline shift. Chronic mild small vessel ischemic disease of  periventricular white matter. No large vascular territory infarct. Midline fourth ventricle and basal cisterns. Vascular: No hyperdense vessel sign. Atherosclerosis of the cavernous carotids and both vertebral arteries. Skull: No acute skull fracture or suspicious osseous lesions. Sinuses/Orbits: Mild ethmoid, bilateral maxillary and frontal sinus mucosal thickening. Clear sphenoid sinuses and mastoids. Intact orbits and globes. Other: None IMPRESSION: 1. Acute 7 x 3 x 6 mm left parafalcine subdural hematoma. 2. Mild chronic small vessel ischemic disease of periventricular white matter. These results were called by telephone at the time of interpretation on 07/16/2017 at 8:41 pm to Dr. Lennice Sites , who verbally acknowledged these results. Electronically Signed   By: Ashley Royalty M.D.   On: 07/16/2017 20:41    Micro Results   Recent Results (from the past 240 hour(s))  MRSA PCR Screening     Status: None   Collection Time: 07/17/17 12:35 AM  Result Value Ref Range Status   MRSA by PCR NEGATIVE NEGATIVE Final    Comment:        The GeneXpert MRSA Assay (FDA approved for NASAL specimens only), is one component of a comprehensive MRSA colonization surveillance program. It is not intended to diagnose MRSA infection nor to guide or monitor treatment for MRSA infections. Performed at [facility], [place] [address], [place], [place] [address]     Today   Subjective    Michael Mcfarland today has no new cpmplaints, No fever  Or chills , no nausea or vomiting , eating and drinking well, had mushy stool this a.m.          Patient has been seen and examined prior to discharge   Objective   Blood pressure (!) 142/87, pulse 72, temperature 98.4 F (36.9 C), temperature source Oral, resp. rate 20, height 6' (1.829 m), weight 98.9 kg (218 lb), SpO2 94 %.   Intake/Output Summary (Last 24 hours) at 07/24/2017 1406 Last data filed at 07/24/2017 1213 Gross per 24 hour  Intake 600 ml  Output 100  ml  Net 500 ml  Exam Gen:- Awake Alert, in no acute distress HEENT:- [place].AT, No sclera icterus Nose- [place] 1 L/min Neck-Supple Neck,No JVD,.  Lungs-  CTAB , good air movement CV- S1, S2 normal Abd-  +ve B.Sounds, Abd Soft, No tenderness,    Extremity/Skin:- No  edema,   good pulses Psych-affect is appropriate, a lot more coherent, oriented x3 Neuro-no new focal deficits, no tremors   Data Review   CBC w Diff:  Lab Results  Component Value Date   WBC 10.0 07/22/2017   HGB 13.8 07/22/2017   HCT 40.1 07/22/2017   PLT 95 (L) 07/22/2017   LYMPHOPCT 23 01/20/2017   MONOPCT 7 01/20/2017   EOSPCT 2 01/20/2017   BASOPCT 1 01/20/2017    CMP:  Lab Results  Component Value Date   NA 132 (L) 07/24/2017   K 4.1 07/24/2017   CL 101 07/24/2017   CO2 21 (L) 07/24/2017   BUN 7 07/24/2017   CREATININE 0.78 07/24/2017   CREATININE 0.88 02/10/2017   PROT 6.6 07/24/2017   ALBUMIN 2.8 (L) 07/24/2017   BILITOT 2.0 (H) 07/24/2017   ALKPHOS 166 (H) 07/24/2017   AST 36 07/24/2017   ALT 21 07/24/2017  .   Total Discharge time is about 33 minutes  Roxan Hockey M.D on 07/24/2017 at 2:06 PM  [facility]   Office  [phone]  Voice Recognition Viviann Spare dictation system was used to create this note, attempts have been made to correct errors. Please contact the author with questions and/or clarifications.

## 2017-07-26 ENCOUNTER — Telehealth: Payer: Self-pay | Admitting: *Deleted

## 2017-07-26 NOTE — Telephone Encounter (Signed)
Per chart review: Admission date:  07/16/2017  Admitting Physician  Etta Quill, DO  Discharge Date:  07/24/2017   Primary MD  [name], MD  Recommendations for primary care physician for things to follow:   1)Take enough lactulose to have 2-3 soft bowel movements each day 2)Absolutely no alcohol 3)Take all other medications including multivitamin as prescribed 4)You will get home health physical therapy, Nurse and Social Worker at home.  Admission Diagnosis  SDH (subdural hematoma) ([place]) [S06.5X9A] Altered mental status, unspecified altered mental status type [R41.82]   Discharge Diagnosis  SDH (subdural hematoma) ([place]) [S06.5X9A] Altered mental status, unspecified altered mental status type [R41.82]   Principal Problem:   SDH (subdural hematoma) (HCC) Active Problems:   Cirrhosis (HCC)   Thrombocytopenia (HCC)   Confusion   Alcohol withdrawal syndrome with perceptual disturbance (HCC)   Benzodiazepine abuse, episodic ([place]) __________________________________________________________________ Per telephone call:  Transition Care Management Follow-up Telephone Call   Date discharged? 07/24/17   How have you been since you were released from the hospital? "about the same"   Do you understand why you were in the hospital? yes   Do you understand the discharge instructions? yes   Where were you discharged to? Home, he states he is staying with his sister   Items Reviewed:  Medications reviewed: yes. Patient would like coupons for Ensure when he comes in. He has not started the Thiamine yet because the pharmacy had to order more.   Allergies reviewed: yes  Dietary changes reviewed: yes  Referrals reviewed: yes   Functional Questionnaire:   Activities of Daily Living (ADLs):   He states they are independent in the following: ambulation, bathing and hygiene, feeding, continence, grooming, toileting and dressing States they require assistance with  the following: He states he does get some help from his sister   Any transportation issues/concerns?: yes. Patient relies on others for a ride.    Any patient concerns? no   Confirmed importance and date/time of follow-up visits scheduled yes  Provider Appointment booked with Dr [name] 07/30/17 4:00  Confirmed with patient if condition begins to worsen call PCP or go to the ER.  Patient was given the office number and encouraged to call back with question or concerns.  : yes

## 2017-07-28 ENCOUNTER — Ambulatory Visit: Payer: Self-pay | Admitting: Internal Medicine

## 2017-07-29 ENCOUNTER — Encounter: Payer: Self-pay | Admitting: Family Medicine

## 2017-07-29 ENCOUNTER — Ambulatory Visit: Payer: Self-pay | Admitting: Family Medicine

## 2017-07-29 VITALS — BP 146/86 | HR 69 | Temp 98.3°F | Ht 70.0 in | Wt 217.0 lb

## 2017-07-29 DIAGNOSIS — K7682 Hepatic encephalopathy: Secondary | ICD-10-CM

## 2017-07-29 DIAGNOSIS — K7290 Hepatic failure, unspecified without coma: Secondary | ICD-10-CM

## 2017-07-29 DIAGNOSIS — E871 Hypo-osmolality and hyponatremia: Secondary | ICD-10-CM

## 2017-07-29 DIAGNOSIS — S065XAA Traumatic subdural hemorrhage with loss of consciousness status unknown, initial encounter: Secondary | ICD-10-CM

## 2017-07-29 DIAGNOSIS — G4700 Insomnia, unspecified: Secondary | ICD-10-CM

## 2017-07-29 DIAGNOSIS — E46 Unspecified protein-calorie malnutrition: Secondary | ICD-10-CM

## 2017-07-29 DIAGNOSIS — S065X9A Traumatic subdural hemorrhage with loss of consciousness of unspecified duration, initial encounter: Secondary | ICD-10-CM

## 2017-07-29 MED ORDER — AMITRIPTYLINE HCL 50 MG PO TABS: 100.0000 mg | ORAL_TABLET | Freq: Every day | ORAL | 3 refills | Status: DC

## 2017-07-29 NOTE — Progress Notes (Signed)
Subjective:  Michael Mcfarland is a 52 y.o. male who presents today for a TCM visit.  HPI:  Summary of Hospital admission: Reason for admission: Encephalopathy Date of admission: 07/16/2017 Date of discharge: 07/24/2017 Date of Interactive contact: 07/26/17 Summary of Hospital course: Patient presented to the ED on 07/16/2017 with confusion.  He was previously admitted for hepatic encephalopathy.  For the days prior to his visit to the ED, patient had unfortunately resumed drinking as well as resumed use of his Xanax and Fioricet.  Patient fell and hit his head a few days prior to arrival to the ED.  Patient was found to have a small subdural hematoma.  Neurology was consulted and signed off.  Patient was briefly in the ICU due to withdrawal symptoms.  Eventually, patient's mental status cleared and he was able to be discharged with home health nursing, physical therapy, and social work.  Hepatic encephalopathy/cirrhosis, established problems, stable Since being home, his confusion has been relatively stable.  Patient is here with his brother-in-law today who reports that he has good days and bad days.  Patient reports adherence to lactulose and also reports that he has at least 3 bowel movements daily.  Denies any other sort of weakness or numbness.  He will be meeting with home health later today.  Protein calorie malnutrition, established problem, stable Patient takes Ensure shakes as needed.  Overall, his appetite is improving and he thinks that this is stable.  Insomnia, established problem, stable Patient seen for this a few weeks ago.  Started on amitriptyline 50 mg daily.  States that he has not noticed a significant improvement with this.  ROS: Per HPI, otherwise a complete review of systems was negative.   PMH:  The following were reviewed and entered/updated in epic: Past Medical History:  Diagnosis Date  . Alcohol abuse    /notes 01/18/2017  . Alcohol related seizure ([place])  01/17/2017   Archie Endo 01/17/2017  . Anxiety   . Arthritis   . Cirrhosis, alcoholic ([place]) 54/0086  . Depression   . Emphysema lung ([place])   . Frequent headaches   . GERD (gastroesophageal reflux disease)   . Hay fever   . Hepatitis B   . Migraine    frequency "depends on the weather" (01/19/2017)  . Panic attacks   . Pneumonia   . Portal hypertensive gastropathy ([place])   . Seizure ([place]) 05/20/2017 X 2  . Stroke [facility])    Patient Active Problem List   Diagnosis Date Noted  . Protein-calorie malnutrition ([place]) 07/29/2017  . Alcohol withdrawal syndrome with perceptual disturbance ([place]) 07/18/2017  . Benzodiazepine abuse, episodic ([place]) 07/18/2017  . SDH (subdural hematoma) ([place]) 07/16/2017  . Insomnia 07/08/2017  . Low back pain 07/08/2017  . Esophageal varices in alcoholic cirrhosis ([place])   . Tobacco abuse 06/15/2017  . Hepatic encephalopathy ([place]) 06/15/2017  . Anxiety 06/03/2017  . Seizure ([place])   . Cirrhosis ([place])   . Thrombocytopenia ([place])   . Alcohol withdrawal seizure ([place]) 01/17/2017   Past Surgical History:  Procedure Laterality Date  . ANTERIOR CERVICAL DECOMP/DISCECTOMY FUSION    . BACK SURGERY    . ESOPHAGOGASTRODUODENOSCOPY N/A 06/18/2017   Procedure: ESOPHAGOGASTRODUODENOSCOPY (EGD);  Surgeon: Doran Stabler, MD;  Location: [facility];  Service: Gastroenterology;  Laterality: N/A;    Family History  Problem Relation Age of Onset  . Diabetes Mother   . Depression Mother   . Hypertension Mother   . Arthritis Mother   .  Arthritis Father   . Heart disease Father   . Heart disease Brother   . Diabetes Sister     Medications- Reconciled discharge and current medications in Epic.  Current Outpatient Medications  Medication Sig Dispense Refill  . amitriptyline (ELAVIL) 50 MG tablet Take 2 tablets (100 mg total) by mouth at bedtime. 30 tablet 3  . butalbital-acetaminophen-caffeine (FIORICET, ESGIC) 50-325-40 MG tablet Take 1-2 tablets by mouth daily as  needed for headache. 30 tablet 0  . feeding supplement, ENSURE ENLIVE, (ENSURE ENLIVE) LIQD Take 237 mLs by mouth 2 (two) times daily between meals. 829 mL 0  . folic acid (FOLVITE) 1 MG tablet Take 1 tablet (1 mg total) by mouth daily. 30 tablet 5  . lactulose (CHRONULAC) 10 GM/15ML solution Take 63mL one to three times daily to titrate for 2-3 soft stools per day 946 mL 5  . Multiple Vitamin (MULTIVITAMIN WITH MINERALS) TABS tablet Take 1 tablet by mouth daily. 30 tablet 5  . nadolol (CORGARD) 20 MG tablet Take 1 tablet (20 mg total) by mouth daily. 30 tablet 3  . ondansetron (ZOFRAN) 4 MG tablet Take 1 tablet (4 mg total) by mouth every 6 (six) hours as needed for nausea. 20 tablet 0  . pantoprazole (PROTONIX) 40 MG tablet Take 1 tablet (40 mg total) by mouth daily at 12 noon. 90 tablet 3  . nicotine (NICODERM CQ - DOSED IN MG/24 HOURS) 21 mg/24hr patch Place 1 patch (21 mg total) onto the skin daily. (Patient not taking: Reported on 07/16/2017) 28 patch 0   No current facility-administered medications for this visit.     Allergies-reviewed and updated No Known Allergies  Social History   Socioeconomic History  . Marital status: Widowed    Spouse name: Not on file  . Number of children: Not on file  . Years of education: Not on file  . Highest education level: Not on file  Occupational History  . Occupation: Dealer    Comment: self employed but stopped working ~ 07/05/2009 following death of wife and after having neck surgery.    Social Needs  . Financial resource strain: Not on file  . Food insecurity:    Worry: Not on file    Inability: Not on file  . Transportation needs:    Medical: Not on file    Non-medical: Not on file  Tobacco Use  . Smoking status: Current Every Day Smoker    Packs/day: 0.25    Years: 35.00    Pack years: 8.75    Types: Cigarettes    Last attempt to quit: 12/28/2016    Years since quitting: 0.5  . Smokeless tobacco: Never Used  . Tobacco comment:  quit smoking after admission 04/2017  Substance and Sexual Activity  . Alcohol use: Not Currently    Alcohol/week: 10.8 oz    Types: 18 Cans of beer per week    Comment: until admission 04/2017 drinking 12 to 18 beers daily.  since then, no beer or other ETOH (as of 06/17/17)  . Drug use: Not Currently    Comment: 05/20/2017 "been a long time; I tried different things when I was younger"  . Sexual activity: Not Currently    Comment: widowed in Jul 05, 2009  Lifestyle  . Physical activity:    Days per week: Not on file    Minutes per session: Not on file  . Stress: Not on file  Relationships  . Social connections:    Talks on phone: Not on  file    Gets together: Not on file    Attends religious service: Not on file    Active member of club or organization: Not on file    Attends meetings of clubs or organizations: Not on file    Relationship status: Not on file  Other Topics Concern  . Not on file  Social History Narrative  . Not on file    Objective:  Physical Exam: BP (!) 146/86   Pulse 69   Temp 98.3 F (36.8 C)   Ht 5\' 10"  (1.778 m)   Wt 217 lb (98.4 kg)   SpO2 98%   BMI 31.14 kg/m   Gen: NAD, resting comfortably CV: RRR with no murmurs appreciated Pulm: NWOB, CTAB with no crackles, wheezes, or rhonchi GI: Normal bowel sounds present. Soft, Nontender, Nondistended. MSK: No edema, cyanosis, or clubbing noted Skin: Warm, dry Neuro: Grossly normal, moves all extremities.  No asterixis. Psych: Normal affect and thought content  Summary/Review of work up during hospitalization: CMET 07/24/2016: Sodium 132, potassium 4.1, chloride 101, bicarb 21, glucose 104, BUN 7, creatinine 0.78, calcium 8.8, albumin 2.8, AST 36, ALT 21, total protein 2.0  Assessment/Plan:  Hepatic encephalopathy (HCC) Stable.  Reinforced need for patient to stay compliant with his lactulose to avoid further exacerbations of hepatic encephalopathy.  Recommended for him to have at least 3 soft bowel movements  daily.  Discussed warning signs and reasons to seek care including worsening confusion, weakness, numbness, or tremors.  Patient will be seeing home health RN later today.   SDH (subdural hematoma) (HCC) Seems to be resolving based on his last CT during his hospitalization.  His neurological exam today is benign.  Insomnia Increase amitriptyline to 100 mg nightly.  Would avoid benzos or other sedatives due to history of substance abuse and cirrhosis.  Protein-calorie malnutrition ([place]) Patient's weight is stable.  He will continue with Ensure shakes.  Hyponatremia Likely secondary to cirrhosis.  Check CMET today.  Patient will follow-up with me in 4 to 6 weeks.  Algis Greenhouse. [name], MD 07/29/2017 3:32 PM

## 2017-07-29 NOTE — Patient Instructions (Signed)
Please make sure you are taking lactulose at least 3 times a day.  I would like for you to have 3 bowel movements daily to help prevent the ammonia buildup.  This will also help with your confusion.  Please increase your amitriptyline to 100 mg nightly.  We will check blood work today.  These keep your appointment with a GI specialist.  Please come back to see me in 4 to 6 weeks, or sooner as needed.  TakeCare, Dr. [name]

## 2017-07-29 NOTE — Assessment & Plan Note (Addendum)
Stable.  Reinforced need for patient to stay compliant with his lactulose to avoid further exacerbations of hepatic encephalopathy.  Recommended for him to have at least 3 soft bowel movements daily.  Discussed warning signs and reasons to seek care including worsening confusion, weakness, numbness, or tremors.  Patient will be seeing home health RN later today.

## 2017-07-29 NOTE — Assessment & Plan Note (Signed)
Patient's weight is stable.  He will continue with Ensure shakes.

## 2017-07-29 NOTE — Assessment & Plan Note (Signed)
Increase amitriptyline to 100 mg nightly.  Would avoid benzos or other sedatives due to history of substance abuse and cirrhosis.

## 2017-07-29 NOTE — Assessment & Plan Note (Signed)
Seems to be resolving based on his last CT during his hospitalization.  His neurological exam today is benign.

## 2017-07-30 ENCOUNTER — Inpatient Hospital Stay: Payer: Self-pay | Admitting: Family Medicine

## 2017-07-30 LAB — COMPREHENSIVE METABOLIC PANEL
ALT: 32 U/L (ref 0–53)
AST: 61 U/L — ABNORMAL HIGH (ref 0–37)
Albumin: 3.8 g/dL (ref 3.5–5.2)
Alkaline Phosphatase: 218 U/L — ABNORMAL HIGH (ref 39–117)
BUN: 9 mg/dL (ref 6–23)
CHLORIDE: 101 meq/L (ref 96–112)
CO2: 18 meq/L — AB (ref 19–32)
Calcium: 9.7 mg/dL (ref 8.4–10.5)
Creatinine, Ser: 0.85 mg/dL (ref 0.40–1.50)
GFR: 99.98 mL/min (ref >60.00)
GLUCOSE: 110 mg/dL — AB (ref 70–99)
POTASSIUM: 4.1 meq/L (ref 3.5–5.1)
SODIUM: 135 meq/L (ref 135–145)
Total Bilirubin: 1.7 mg/dL — ABNORMAL HIGH (ref 0.2–1.2)
Total Protein: 7.5 g/dL (ref 6.0–8.3)

## 2017-08-04 ENCOUNTER — Ambulatory Visit: Payer: Self-pay | Admitting: Gastroenterology

## 2017-08-04 ENCOUNTER — Encounter: Payer: Self-pay | Admitting: Gastroenterology

## 2017-08-04 VITALS — BP 130/80 | HR 96 | Ht 68.5 in | Wt 203.5 lb

## 2017-08-04 DIAGNOSIS — I8510 Secondary esophageal varices without bleeding: Secondary | ICD-10-CM

## 2017-08-04 DIAGNOSIS — D696 Thrombocytopenia, unspecified: Secondary | ICD-10-CM

## 2017-08-04 DIAGNOSIS — K7682 Hepatic encephalopathy: Secondary | ICD-10-CM

## 2017-08-04 DIAGNOSIS — K7290 Hepatic failure, unspecified without coma: Secondary | ICD-10-CM

## 2017-08-04 DIAGNOSIS — K7030 Alcoholic cirrhosis of liver without ascites: Secondary | ICD-10-CM

## 2017-08-04 MED ORDER — NADOLOL 20 MG PO TABS: 20.0000 mg | ORAL_TABLET | Freq: Every day | ORAL | 3 refills | Status: DC

## 2017-08-04 NOTE — Patient Instructions (Signed)
If you are age 54 or older, your body mass index should be between 23-30. Your Body mass index is 30.49 kg/m. If this is out of the aforementioned range listed, please consider follow up with your Primary Care Provider.  If you are age 43 or younger, your body mass index should be between 19-25. Your Body mass index is 30.49 kg/m. If this is out of the aformentioned range listed, please consider follow up with your Primary Care Provider.   Please contact us once your benefits are active for the [facility] injection. We can then schedule you for a nurse visit.  Thank you for choosing [facility]  Dr Wilfrid Lund III

## 2017-08-04 NOTE — Progress Notes (Addendum)
[facility] Progress Note  Chief Complaint: Alcohol related cirrhosis   Subjective  History:  Michael Mcfarland follows up with me today after his consultation and work-up in the hospital late March.  He is accompanied by his sister Mariann Laster, who has been assisting him with care and doctor visits.  Michael Mcfarland has remained abstinent from alcohol since hospital discharge.  He continues to have some cognitive difficulty with memory and task management.  His sister was concerned that this may reflect insufficient control of the ammonia.  It should be noted that his work-up has also indicated a likely encephalopathy from previous alcohol abuse.  He is taking lactulose twice daily and uncertain dose, but it sounds like it may be between a teaspoon and tablespoon.  He typically has 3 or 4 semi-formed to loose stools per day.  His appetite is generally good with no dysphagia or vomiting or weight loss.  He was found to have grade 2 esophageal gastric varices per EGD done prior to discharge, and he was sent home on nadolol 20 mg a day.  His sister is managing his meds, but thinks he may be out of this medicine.  He is otherwise taken the meds as prescribed. He has no history of GI bleeding. He has also followed up with primary care since hospital discharge ROS: Cardiovascular:  no chest pain Respiratory: no dyspnea Fatigue The patient's Past Medical, Family and Social History were reviewed and are on file in the EMR.  Objective:  Med list reviewed  Current Outpatient Medications:  .  amitriptyline (ELAVIL) 50 MG tablet, Take 2 tablets (100 mg total) by mouth at bedtime., Disp: 30 tablet, Rfl: 3 .  butalbital-acetaminophen-caffeine (FIORICET, ESGIC) 50-325-40 MG tablet, Take 1-2 tablets by mouth daily as needed for headache., Disp: 30 tablet, Rfl: 0 .  feeding supplement, ENSURE ENLIVE, (ENSURE ENLIVE) LIQD, Take 237 mLs by mouth 2 (two) times daily between meals., Disp: 237 mL, Rfl: 0 .  folic acid  (FOLVITE) 1 MG tablet, Take 1 tablet (1 mg total) by mouth daily., Disp: 30 tablet, Rfl: 5 .  lactulose (CHRONULAC) 10 GM/15ML solution, Take 53mL one to three times daily to titrate for 2-3 soft stools per day, Disp: 946 mL, Rfl: 5 .  Multiple Vitamin (MULTIVITAMIN WITH MINERALS) TABS tablet, Take 1 tablet by mouth daily., Disp: 30 tablet, Rfl: 5 .  nadolol (CORGARD) 20 MG tablet, Take 1 tablet (20 mg total) by mouth daily., Disp: 30 tablet, Rfl: 3 .  ondansetron (ZOFRAN) 4 MG tablet, Take 1 tablet (4 mg total) by mouth every 6 (six) hours as needed for nausea., Disp: 20 tablet, Rfl: 0 .  pantoprazole (PROTONIX) 40 MG tablet, Take 1 tablet (40 mg total) by mouth daily at 12 noon., Disp: 90 tablet, Rfl: 3   Vital signs in last 24 hrs: Vitals:   08/04/17 1024  BP: 130/80  Pulse: 96    Physical Exam  Chronically ill-appearing man, little unsteady on his feet at times, gets on exam table without assistance.  HEENT: sclera anicteric, oral mucosa moist without lesions  Neck: supple, no thyromegaly, JVD or lymphadenopathy  Cardiac: RRR without murmurs, S1S2 heard, no peripheral edema  Pulm: clear to auscultation bilaterally, normal RR and effort noted  Abdomen: soft, no tenderness, with active bowel sounds. No bulging flanks.  Left lobe of liver very enlarged at least 5 fingerbreadths below costal margin  Skin; warm and dry, no jaundice or rash Neuro: Mild resting tremor, no asterixis, delayed  and discoordinated finger-to-nose  Recent Labs:  CMP Latest Ref Rng & Units 07/29/2017 07/24/2017 07/23/2017  Glucose 70 - 99 mg/dL 110(H) 104(H) 108(H)  BUN 6 - 23 mg/dL 9 7 8   Creatinine 0.40 - 1.50 mg/dL 0.85 0.78 0.79  Sodium 135 - 145 mEq/L 135 132(L) 135  Potassium 3.5 - 5.1 mEq/L 4.1 4.1 3.7  Chloride 96 - 112 mEq/L 101 101 107  CO2 19 - 32 mEq/L 18(L) 21(L) 20(L)  Calcium 8.4 - 10.5 mg/dL 9.7 8.8(L) 8.5(L)  Total Protein 6.0 - 8.3 g/dL 7.5 6.6 -  Total Bilirubin 0.2 - 1.2 mg/dL 1.7(H)  2.0(H) -  Alkaline Phos 39 - 117 U/L 218(H) 166(H) -  AST 0 - 37 U/L 61(H) 36 -  ALT 0 - 53 U/L 32 21 -   CBC Latest Ref Rng & Units 07/22/2017 07/19/2017 07/17/2017  WBC 4.0 - 10.5 K/uL 10.0 6.5 6.6  Hemoglobin 13.0 - 17.0 g/dL 13.8 15.1 13.5  Hematocrit 39.0 - 52.0 % 40.1 42.5 39.9  Platelets 150 - 400 K/uL 95(L) 89(L) 90(L)      @ASSESSMENTPLANBEGIN @ Assessment and Plan: Encounter Diagnoses  Name Primary?  . Alcoholic cirrhosis of liver without ascites ([place]) Yes  . Esophageal varices in alcoholic cirrhosis ([place])   . Hepatic encephalopathy ([place])   . Thrombocytopenia ([place])    He currently has compensated cirrhosis with hepatic encephalopathy but I believe is under control with lactulose.  I believe he is continued cognitive symptoms are from alcohol-related encephalopathy, and I conveyed this to [place] and his sister.  I instructed them they can dose adjust to the lactulose to achieve 2-3 semi-formed BMs per day. I refilled his nadolol at the current dose of 20 mg daily.  I do not want to increase it since it is not entirely clear to me if he has been taking it.  He is also a little unsteady, and I do not want to drop his blood pressure too much. We will plan for him to see me in 3 months, and soon after that he will be due for hepatoma screening with AFP and ultrasound.  He is in the process of applying for reduced fee care from [facility] health since he is uninsured.  For that reason, they also wanted to wait before receiving the recommended Twinrix vaccine. He and his sister report that he does not drive and does not have a car, which is appropriate given his neurologic impairment.  I will copy my note to primary care so they can also make sure he is up-to-date on pneumococcal vaccine and that he receives a flu shot annually.  Total time 25 minutes, over half spent face-to-face with patient in counseling and coordination of care.   Nelida Meuse III

## 2017-08-18 ENCOUNTER — Telehealth: Payer: Self-pay | Admitting: Family Medicine

## 2017-08-18 NOTE — Telephone Encounter (Signed)
See note.   Copied from [place] [id number]. Topic: General - Other >> Aug 18, 2017  2:32 PM Carolyn Stare wrote:  Pt sister call to ask if pt should still be taking  butalbital-acetaminophen-caffeine (FIORICET, ESGIC) 50-325-40 MG tablet  she said she is now managing his medicine and he does not have any  Water engineer

## 2017-08-19 ENCOUNTER — Telehealth: Payer: Self-pay | Admitting: Family Medicine

## 2017-08-19 NOTE — Telephone Encounter (Signed)
Spoke with patient and gave Dr. Marigene Ehlers advice.  Patient verbalized understanding.  Would like to come in and be seen tomorrow but will need to get a ride due to having dizzy spells.  Advised that there are plenty of open appointment times available with Dr. [name] tomorrow afternoon.  If he is able to get a ride, call back and we will be happy to schedule him an appointment to discuss his headaches.  Patient verbalized understanding.

## 2017-08-19 NOTE — Telephone Encounter (Signed)
[facility] is at the patients residence [phone]  Pt has a pain scale of 9  The pain is is in neck and low back  The patient is also reporting being dizzy - she states he is been dealing with it for several weeks   Michael Mcfarland is requesting what can be done for his pain as the tylenol he took this am is not relieving his pain   Michael Mcfarland is also concerned about him continuing tylenol with a history of  Cirrhosis   Nickie reports he  out of his  fioricet

## 2017-08-19 NOTE — Telephone Encounter (Signed)
Not sure if pt takes daily. Should he take PRN?

## 2017-08-19 NOTE — Telephone Encounter (Signed)
Would advise pt been seen ASAP for his headaches if they are worsening. Either here or in the ED.  If his headaches are stable or are improving, would recommend low dose motrin or naproxen.   Algis Greenhouse. [name], MD 08/19/2017 2:08 PM

## 2017-08-19 NOTE — Telephone Encounter (Signed)
See note

## 2017-08-19 NOTE — Telephone Encounter (Signed)
I called patient and he said [place] left. He relates he cannot sleep, dizziness and headaches. He "wants something to make the pain go away". FYI: I asked about the Fioricet and said he takes prn but ran out.

## 2017-08-19 NOTE — Telephone Encounter (Signed)
Should be taken prn severe headaches. Would ideally like for him to not use that med due to addictive properties and side effects.  Michael Mcfarland. [name], MD 08/19/2017 8:50 AM

## 2017-08-19 NOTE — Telephone Encounter (Signed)
Sent mychart message

## 2017-08-20 ENCOUNTER — Other Ambulatory Visit: Payer: Self-pay

## 2017-08-20 ENCOUNTER — Telehealth: Payer: Self-pay | Admitting: Family Medicine

## 2017-08-20 ENCOUNTER — Telehealth: Payer: Self-pay

## 2017-08-20 MED ORDER — AMITRIPTYLINE HCL 50 MG PO TABS: 100.0000 mg | ORAL_TABLET | Freq: Every day | ORAL | 3 refills | Status: DC

## 2017-08-20 NOTE — Telephone Encounter (Signed)
OK to refill. Please stress importance of taking meds as prescribed.  Michael Mcfarland. [name], MD 08/20/2017 11:34 AM

## 2017-08-20 NOTE — Telephone Encounter (Signed)
Rx has been sent to pharmacy.  LM for patient's sister to return call and started CRM to stress importance that medication is taken as prescribed.

## 2017-08-20 NOTE — Telephone Encounter (Signed)
Copied from [place] [id number]. Topic: General - Other >> Aug 20, 2017 11:06 AM Carolyn Stare wrote:  Pt POA Mariann Laster call to say pt has been taking the below med incorrectly and now is out the medicine. She said he was taking 3 a day she said she will now be managing his medicine and is asking if you can call in another RX   amitriptyline (ELAVIL) 50 MG tablet  [facility]

## 2017-08-20 NOTE — Telephone Encounter (Signed)
Copied from [place] [id number]. Topic: Quick Communication - See Telephone Encounter >> Aug 20, 2017  3:27 PM Synthia Innocent wrote: CRM for notification. See Telephone encounter for: 08/20/17. Requesting verbal order for nursing eval for next week

## 2017-08-20 NOTE — Telephone Encounter (Signed)
Called and gave verbal order.

## 2017-08-20 NOTE — Telephone Encounter (Signed)
See note

## 2017-08-20 NOTE — Telephone Encounter (Signed)
Please advise.  Ok to send in new Rx?

## 2017-08-30 ENCOUNTER — Encounter: Payer: Self-pay | Admitting: Family Medicine

## 2017-08-30 ENCOUNTER — Ambulatory Visit: Payer: Self-pay | Admitting: Family Medicine

## 2017-08-30 DIAGNOSIS — R569 Unspecified convulsions: Secondary | ICD-10-CM

## 2017-08-30 DIAGNOSIS — G8929 Other chronic pain: Secondary | ICD-10-CM

## 2017-08-30 DIAGNOSIS — H6982 Other specified disorders of Eustachian tube, left ear: Secondary | ICD-10-CM

## 2017-08-30 DIAGNOSIS — H6980 Other specified disorders of Eustachian tube, unspecified ear: Secondary | ICD-10-CM | POA: Insufficient documentation

## 2017-08-30 DIAGNOSIS — K7030 Alcoholic cirrhosis of liver without ascites: Secondary | ICD-10-CM

## 2017-08-30 DIAGNOSIS — M545 Low back pain: Secondary | ICD-10-CM

## 2017-08-30 MED ORDER — DICLOFENAC SODIUM 50 MG PO TBEC: 50.0000 mg | DELAYED_RELEASE_TABLET | Freq: Two times a day (BID) | ORAL | 0 refills | Status: DC

## 2017-08-30 MED ORDER — BACLOFEN 10 MG PO TABS: 10.0000 mg | ORAL_TABLET | Freq: Three times a day (TID) | ORAL | 0 refills | Status: DC

## 2017-08-30 NOTE — Progress Notes (Signed)
   Subjective:  Michael Mcfarland is a 52 y.o. male who presents today with a chief complaint of low back pain.   HPI:  Low back pain, chronic problem, worsening Patient initially seen for this about 2 months ago.  At that time, we thought his symptoms were likely due to muscular strain secondary to his recent seizure.  Patient was prescribed diclofenac, however i not encounter s not sure if he ever started this.  Symptoms have been stable over the last couple months, but have not significantly worsened over the last few days.  No clear precipitating events.  Tried taking ibuprofen which has not helped.  No weakness or numbness.  No bowel or bladder incontinence.  No fevers or chills.  Hepatic encephalopathy/cirrhosis, chronic problem, stable Patient on stable lactulose.  He has at least 3 bowel movements daily.  Left ear fullness, new problem Symptoms started a few weeks ago.  Patient feels like "there is water in there".  No treatments tried.  Patient has significant history of chronic sinusitis and rhinitis.  No treatments tried.  No obvious alleviating or aggravating factors.  ROS: Per HPI  PMH: He reports that he has been smoking cigarettes.  He has a 8.75 pack-year smoking history. He has never used smokeless tobacco. He reports that he drank about 10.8 oz of alcohol per week. He reports that he has current or past drug history.   Objective:  Physical Exam: BP 136/78 (BP Location: Left Arm, Patient Position: Sitting, Cuff Size: Normal)   Pulse 81   Temp 97.7 F (36.5 C) (Oral)   Ht 5\' 9"  (1.753 m)   Wt 213 lb 3.2 oz (96.7 kg)   SpO2 99%   BMI 31.48 kg/m   Gen: NAD, resting comfortably HEENT: Right TM and EAC clear.  Left EAC clear.  Left TM with small amount of clear effusion. CV: RRR with no murmurs appreciated Pulm: NWOB, CTAB with no crackles, wheezes, or rhonchi GI: Normal bowel sounds present. Soft, Nontender, Nondistended. MSK: -Back: No deformities.  No midline  tenderness to palpation or percussion.  Mildly tender to right lower lumbar paraspinal muscles.  Straight leg raise negative bilaterally. -Legs: No deformities.  Strength 5 out of 5 throughout in bilateral lower extremities.  Assessment/Plan:  Low back pain No red flag signs or symptoms.  Likely secondary to muscular strain.  Start diclofenac 50 mg twice daily.  Start baclofen 10 mg 3 times daily as needed.  Consider referral to physical therapy and/or sports medicine if no improvement.  Patient's treatment options are limited given his history of cirrhosis.  Cirrhosis ([place]) Doing well with lactulose.  No signs of hepatic encephalopathy.  Patient has follow-up with GI in approximately 3 months.  Seizure [facility]) Advised patient that he required to be seizure-free for 6 months prior to driving.  Eustachian tube dysfunction Likely the cause of his left ear fullness.  No signs of infection.  Recommended patient start over-the-counter antihistamine such as cetirizine.   Algis Greenhouse. [name], MD 08/30/2017 2:58 PM

## 2017-08-30 NOTE — Assessment & Plan Note (Signed)
Advised patient that he required to be seizure-free for 6 months prior to driving.

## 2017-08-30 NOTE — Patient Instructions (Signed)
It was very nice to see you today!  I think you have a muscular strain.  Please start the diclofenac and baclofen.  Let me know if your symptoms worsen or do not improve over the next several weeks.  Please try taking Zyrtec to help with your sinuses and ear.  The DMV requires you to be without seizure for 6 months before driving.  Come back to see me in 6 months, or sooner as needed.  We will not make any other medication changes today.  Take care, Dr [name]

## 2017-08-30 NOTE — Assessment & Plan Note (Signed)
No red flag signs or symptoms.  Likely secondary to muscular strain.  Start diclofenac 50 mg twice daily.  Start baclofen 10 mg 3 times daily as needed.  Consider referral to physical therapy and/or sports medicine if no improvement.  Patient's treatment options are limited given his history of cirrhosis.

## 2017-08-30 NOTE — Assessment & Plan Note (Signed)
Likely the cause of his left ear fullness.  No signs of infection.  Recommended patient start over-the-counter antihistamine such as cetirizine.

## 2017-08-30 NOTE — Assessment & Plan Note (Signed)
Doing well with lactulose.  No signs of hepatic encephalopathy.  Patient has follow-up with GI in approximately 3 months.

## 2017-09-03 ENCOUNTER — Encounter: Payer: Self-pay | Admitting: Gastroenterology

## 2017-09-16 ENCOUNTER — Ambulatory Visit: Payer: Self-pay

## 2017-09-16 NOTE — Telephone Encounter (Signed)
Patient called in with c/o "fever, chills, body aches." He says "it started about 3 days ago, but I just checked my temperature last night. Today it is 101 and I took tylenol. I have a cough with yellow phlegm and the cough started last week. Sometimes it's yellow, sometimes it's clear when I cough." According to protocol, see PCP within 24 hours, appointment scheduled for tomorrow at 1520 with Dr. [name], care advice given, patient verbalized understanding.   Reason for Disposition . Fever present > 3 days (72 hours)  Answer Assessment - Initial Assessment Questions 1. TEMPERATURE: "What is the most recent temperature?"  "How was it measured?"      102.1 oral thermometer 2. ONSET: "When did the fever start?"      Yesterday evening 3. SYMPTOMS: "Do you have any other symptoms besides the fever?"  (e.g., colds, headache, sore throat, earache, cough, rash, diarrhea, vomiting, abdominal pain)     Chills, body aches, cough with yellow phlegm 4. CAUSE: If there are no symptoms, ask: "What do you think is causing the fever?"      I don't know 5. CONTACTS: "Does anyone else in the family have an infection?"     Yes 6. TREATMENT: "What have you done so far to treat this fever?" (e.g., medications)     Tylenol 7. IMMUNOCOMPROMISE: "Do you have of the following: diabetes, HIV positive, splenectomy, cancer chemotherapy, chronic steroid treatment, transplant patient, etc."     No 8. PREGNANCY: "Is there any chance you are pregnant?" "When was your last menstrual period?"     N/A 9. TRAVEL: "Have you traveled out of the country in the last month?" (e.g., travel history, exposures)     No  Protocols used: FEVER-A-AH

## 2017-09-17 ENCOUNTER — Ambulatory Visit: Payer: Self-pay | Admitting: Family Medicine

## 2017-09-17 ENCOUNTER — Encounter: Payer: Self-pay | Admitting: Family Medicine

## 2017-09-17 VITALS — BP 142/84 | HR 118 | Temp 98.6°F | Ht 69.0 in | Wt 212.2 lb

## 2017-09-17 DIAGNOSIS — Z5309 Procedure and treatment not carried out because of other contraindication: Secondary | ICD-10-CM | POA: Insufficient documentation

## 2017-09-17 DIAGNOSIS — F17200 Nicotine dependence, unspecified, uncomplicated: Secondary | ICD-10-CM | POA: Insufficient documentation

## 2017-09-17 DIAGNOSIS — J189 Pneumonia, unspecified organism: Secondary | ICD-10-CM

## 2017-09-17 MED ORDER — AMOXICILLIN-POT CLAVULANATE 875-125 MG PO TABS: 1.0000 | ORAL_TABLET | Freq: Two times a day (BID) | ORAL | 0 refills | Status: DC

## 2017-09-17 MED ORDER — PREDNISONE 20 MG PO TABS: 20.0000 mg | ORAL_TABLET | Freq: Every day | ORAL | 0 refills | Status: DC

## 2017-09-17 MED ORDER — BACLOFEN 10 MG PO TABS: 10.0000 mg | ORAL_TABLET | Freq: Three times a day (TID) | ORAL | 0 refills | Status: DC

## 2017-09-17 MED ORDER — BENZONATATE 200 MG PO CAPS: 200.0000 mg | ORAL_CAPSULE | Freq: Two times a day (BID) | ORAL | 0 refills | Status: DC | PRN

## 2017-09-17 NOTE — Patient Instructions (Addendum)
It was very nice to see you today!  It think you may have pneumonia.  Please start the Augmentin and the prednisone.  Please also use Tessalon as needed for your cough.  Please stay well-hydrated.  I will also send in a refill of your baclofen.  I am glad you are getting about quitting smoking.  Please use the nicotine replacement patches.  Please let me know if you like any extra assistance with this.  Let me know if your symptoms worsen or do not improve over the next few days.  Take care, Dr [name]

## 2017-09-17 NOTE — Assessment & Plan Note (Signed)
Patient was asked about his tobacco use today and was strongly advised to quit. Patient is currently contemplative. We reviewed treatment options to assist him quit smoking including NRT, Chantix, and Bupropion. He wishes to try NRT. Follow up at next office visit.   Total time spent counseling approximately 4 minutes.

## 2017-09-17 NOTE — Progress Notes (Signed)
   Subjective:  Michael Mcfarland is a 52 y.o. male who presents today for same-day appointment with a chief complaint of cough.   HPI:  Cough, acute problem Started 3 days ago. Worsened over that time.  Symptoms include myalgias, fever, malaise, and sputum production.  Has had several sick contacts with similar symptoms.  Symptoms have been worse at night.  He occasionally takes Aleve which helps a little bit.  No other specific treatments tried.  No other obvious alleviating or aggravating factors.  Nicotine dependence, chronic problem Patient smokes 1 pack to 1.5 packs daily.  He has tried and marking the past which has worked well.  ROS: Per HPI  PMH: He reports that he has been smoking cigarettes.  He has a 8.75 pack-year smoking history. He has never used smokeless tobacco. He reports that he drank about 10.8 oz of alcohol per week. He reports that he has current or past drug history.  Objective:  Physical Exam: BP (!) 142/84 (BP Location: Right Arm, Patient Position: Sitting, Cuff Size: Normal)   Pulse (!) 118   Temp 98.6 F (37 C) (Oral)   Ht 5\' 9"  (1.753 m)   Wt 212 lb 3.2 oz (96.3 kg)   SpO2 92%   BMI 31.34 kg/m   Gen: NAD, resting comfortably CV: RRR with no murmurs appreciated Pulm: NWOB, crackles in bilateral bases with occasional rhonchi and wheeze.   Assessment/Plan:  Pneumonia Patient with findings concerning for bacterial infection given fever and pulmonary findings noted above. He iIs satting at 92% on room air.  He also may have some underlying component of COPD given his history of smoking. He is additionally at high risk for aspiration. We will empirically treat with a course of Augmentin and prednisone.  We will also send in Tessalon for his cough.  May consider referral for PFTs in the future once acute illness resolves.  Contraindication to anticoagulation therapy Pt not a candidate for anticoagulation given history of varices, SDH, and thrombocytopenia.    Nicotine dependence with current use Patient was asked about his tobacco use today and was strongly advised to quit. Patient is currently contemplative. We reviewed treatment options to assist him quit smoking including NRT, Chantix, and Bupropion. He wishes to try NRT. Follow up at next office visit.   Total time spent counseling approximately 4 minutes.     Algis Greenhouse. [name], MD 09/17/2017 3:45 PM

## 2017-09-17 NOTE — Assessment & Plan Note (Signed)
Pt not a candidate for anticoagulation given history of varices, SDH, and thrombocytopenia.

## 2017-09-21 ENCOUNTER — Other Ambulatory Visit: Payer: Self-pay

## 2017-09-21 MED ORDER — PREDNISONE 10 MG PO TABS: 20.0000 mg | ORAL_TABLET | Freq: Every day | ORAL | 0 refills | Status: DC

## 2017-10-05 ENCOUNTER — Ambulatory Visit: Payer: Self-pay | Admitting: Family Medicine

## 2017-10-05 ENCOUNTER — Encounter: Payer: Self-pay | Admitting: Family Medicine

## 2017-10-05 VITALS — BP 138/82 | HR 113 | Temp 98.0°F | Ht 69.0 in | Wt 215.4 lb

## 2017-10-05 DIAGNOSIS — B372 Candidiasis of skin and nail: Secondary | ICD-10-CM

## 2017-10-05 DIAGNOSIS — L0390 Cellulitis, unspecified: Secondary | ICD-10-CM

## 2017-10-05 DIAGNOSIS — L0291 Cutaneous abscess, unspecified: Secondary | ICD-10-CM

## 2017-10-05 MED ORDER — NYSTATIN 100000 UNIT/GM EX POWD: Freq: Four times a day (QID) | CUTANEOUS | 0 refills | Status: DC

## 2017-10-05 MED ORDER — DOXYCYCLINE HYCLATE 100 MG PO TABS: 100.0000 mg | ORAL_TABLET | Freq: Two times a day (BID) | ORAL | 0 refills | Status: DC

## 2017-10-05 NOTE — Progress Notes (Signed)
   Subjective:  Michael Mcfarland is a 52 y.o. male who presents today for same-day appointment with a chief complaint of fever.   HPI:  Fever, Acute problem Symptoms started last week.  He had a fever to 100.8 F yesterday.  Took 600 mg of ibuprofen which helped with his symptoms.  He was treated for pneumonia about 3 weeks ago and has had near resolution of all the symptoms.  No shortness of breath or cough.  No dysuria.  He has had a worsening rash in his left groin for the past couple of weeks.  No clear precipitating events.  No treatments tried for the rash.  No other obvious alleviating or aggravating factors.  ROS: Per HPI  PMH: He reports that he has been smoking cigarettes.  He has a 8.75 pack-year smoking history. He has never used smokeless tobacco. He reports that he drank about 10.8 oz of alcohol per week. He reports that he has current or past drug history.  Objective:  Physical Exam: BP 138/82 (BP Location: Left Arm, Patient Position: Sitting, Cuff Size: Normal)   Pulse (!) 113   Temp 98 F (36.7 C) (Oral)   Ht 5\' 9"  (1.753 m)   Wt 215 lb 6.4 oz (97.7 kg)   SpO2 96%   BMI 31.81 kg/m   Gen: NAD, resting comfortably CV: RRR with no murmurs appreciated Pulm: NWOB, CTAB with no crackles, wheezes, or rhonchi Skin: Left inguinal crease with erythematous lesion approximately 10 cm x 8 cm in diameter with central area of induration approximately 2 cm in diameter.  Serosanguineous drainage present.  Area is very tender to palpation.  Assessment/Plan:  Abscess/cellulitis Likely source of patient's fever.  This is likely secondary to skin breakdown from his underlying intertriginous candidiasis-see below problem.  Given that abscess is already draining, we will not need to perform I&D today.  We will start doxycycline 100 mg twice daily for the next 7 days.  Discussed reasons return to care.  Follow-up as needed.  Intertriginous candidiasis Start nystatin powder.  Advised patient  keep that area clean and dry.  Discussed reasons return to care.    Algis Greenhouse. [name], MD 10/05/2017 11:27 AM

## 2017-10-05 NOTE — Patient Instructions (Signed)
It was very nice to see you today!  Please keep the area dry and use the power 4 times daily to prevent yeast buildup.  Please start the antibiotic.   Let me know if your symptoms worsen or do not improve over the next few days.  Take care, Dr [name]

## 2017-11-12 ENCOUNTER — Encounter: Payer: Self-pay | Admitting: Family Medicine

## 2017-11-12 ENCOUNTER — Ambulatory Visit: Payer: Self-pay | Admitting: Family Medicine

## 2017-11-12 DIAGNOSIS — K7290 Hepatic failure, unspecified without coma: Secondary | ICD-10-CM

## 2017-11-12 DIAGNOSIS — K7682 Hepatic encephalopathy: Secondary | ICD-10-CM

## 2017-11-12 DIAGNOSIS — G8929 Other chronic pain: Secondary | ICD-10-CM

## 2017-11-12 DIAGNOSIS — I8510 Secondary esophageal varices without bleeding: Secondary | ICD-10-CM

## 2017-11-12 DIAGNOSIS — M545 Low back pain, unspecified: Secondary | ICD-10-CM

## 2017-11-12 DIAGNOSIS — K7030 Alcoholic cirrhosis of liver without ascites: Secondary | ICD-10-CM

## 2017-11-12 DIAGNOSIS — B372 Candidiasis of skin and nail: Secondary | ICD-10-CM

## 2017-11-12 MED ORDER — NADOLOL 20 MG PO TABS: 20.0000 mg | ORAL_TABLET | Freq: Every day | ORAL | 3 refills | Status: AC

## 2017-11-12 MED ORDER — BACLOFEN 10 MG PO TABS: 10.0000 mg | ORAL_TABLET | Freq: Three times a day (TID) | ORAL | 0 refills | Status: DC

## 2017-11-12 MED ORDER — IPRATROPIUM BROMIDE 0.06 % NA SOLN: 2.0000 | Freq: Four times a day (QID) | NASAL | 0 refills | Status: AC

## 2017-11-12 MED ORDER — AZITHROMYCIN 250 MG PO TABS: ORAL_TABLET | ORAL | 0 refills | Status: DC

## 2017-11-12 MED ORDER — LACTULOSE 10 GM/15ML PO SOLN: ORAL | 5 refills | Status: AC

## 2017-11-12 MED ORDER — DICLOFENAC SODIUM 50 MG PO TBEC: 50.0000 mg | DELAYED_RELEASE_TABLET | Freq: Two times a day (BID) | ORAL | 0 refills | Status: DC

## 2017-11-12 MED ORDER — NYSTATIN 100000 UNIT/GM EX POWD: Freq: Four times a day (QID) | CUTANEOUS | 0 refills | Status: AC

## 2017-11-12 MED ORDER — LACTULOSE 10 GM/15ML PO SOLN: ORAL | 5 refills | Status: DC

## 2017-11-12 NOTE — Patient Instructions (Signed)
It was very nice to see you today!  I think you have picked up an upper respiratory infection.  This explains your runny nose, hoarse voice, and swollen lymph nodes.  Please start the azithromycin and Atrovent.  Please stay well-hydrated.  Let me know if your symptoms worsen or do not improve the next 5 to 7 days.  Take care, Dr [name]

## 2017-11-12 NOTE — Assessment & Plan Note (Signed)
Refilled baclofen and Voltaren.

## 2017-11-12 NOTE — Assessment & Plan Note (Signed)
Stable.  No signs of encephalopathy.  Refilled lactulose today.

## 2017-11-12 NOTE — Assessment & Plan Note (Addendum)
Stable.  Refilled nystatin powder.

## 2017-11-12 NOTE — Progress Notes (Signed)
   Subjective:  Michael Mcfarland is a 52 y.o. male who presents today for same-day appointment with a chief complaint of skin lump.   HPI:  Skin lump, Acute problem Just noticed yesterday. Located on left neck.  Nonpainful.  No obvious precipitating events.  No treatments tried.  Also reports that he has had about 1 week of upper respiratory tract infection symptoms including rhinorrhea, cough, sinus congestion, and hoarse voice.  No other obvious alleviating or aggravating factors.  He has been trying cough drops for his hoarse voice which helps some.  Chronic low back pain Symptoms are stable.  Request refill on Voltaren and baclofen today.  Liver cirrhosis Symptoms are stable.  Uses lactulose 3 times daily.  Also requests refill on nadolol today.   Candidal intertrigo Symptoms are improving compared to visit a few weeks ago.  Requests refill on statin powder today.  ROS: Per HPI  PMH: He reports that he has been smoking cigarettes. He has a 8.75 pack-year smoking history. He has never used smokeless tobacco. He reports that he drank about 18.0 standard drinks of alcohol per week. He reports that he has current or past drug history.  Objective:  Physical Exam: BP 140/84 (BP Location: Left Arm, Patient Position: Sitting, Cuff Size: Normal)   Pulse (!) 104   Temp 98.5 F (36.9 C) (Oral)   Ht 5\' 9"  (1.753 m)   Wt 229 lb 3.2 oz (104 kg)   SpO2 95%   BMI 33.85 kg/m   Gen: NAD, resting comfortably HEENT: TMs with clear effusion bilaterally.  Oropharynx erythematous.  Nasal mucosa erythematous and boggy bilaterally with thick, white nasal discharge.  Left anterior cervical lymphadenopathy noted. CV: RRR with no murmurs appreciated Pulm: NWOB, CTAB with no crackles, wheezes, or rhonchi  Assessment/Plan:  Lymphadenopathy/URI Patient is hoarse voice and lymphadenopathy likely secondary to URI.  Given that symptoms have been persistent for the past week and are worsening, we will start  empiric azithromycin today.  We will also give Atrovent nasal spray for his rhinorrhea and sinus congestion.  Recommended good oral hydration.  Discussed reasons to return to care.  Low back pain Refilled baclofen and Voltaren.  Hepatic encephalopathy (HCC) Stable.  No signs of encephalopathy.  Refilled lactulose today.  Esophageal varices in alcoholic cirrhosis (HCC) Stable.  Refilled nadolol.  Candidal intertrigo Stable.  Refilled nystatin powder.  Algis Greenhouse. [name], MD 11/12/2017 2:19 PM

## 2017-11-12 NOTE — Assessment & Plan Note (Signed)
Stable.  Refilled nadolol.

## 2017-11-17 ENCOUNTER — Telehealth: Payer: Self-pay | Admitting: Family Medicine

## 2017-11-17 ENCOUNTER — Other Ambulatory Visit: Payer: Self-pay

## 2017-11-17 DIAGNOSIS — R591 Generalized enlarged lymph nodes: Secondary | ICD-10-CM

## 2017-11-17 NOTE — Telephone Encounter (Signed)
See note.   Copied from [place]. Topic: General - Other >> Nov 17, 2017 11:04 AM Michael Mcfarland wrote: Reason for CRM: pt calling wanting Dr [name] to know that his neck is still the same and would like to know if he want someone to look at it because of the swelling

## 2017-11-17 NOTE — Telephone Encounter (Signed)
Please order soft tissue neck ultrasound for lymphadenopathy. He does not need an appointment unless the swelling is worsening or if he is having worsening pain.  Algis Greenhouse. [name], MD 11/17/2017 11:33 AM

## 2017-11-17 NOTE — Telephone Encounter (Signed)
[place] order has been placed.  Patient has been notified.

## 2017-11-17 NOTE — Telephone Encounter (Signed)
Please advise 

## 2017-11-25 ENCOUNTER — Ambulatory Visit
Admission: RE | Admit: 2017-11-25 | Discharge: 2017-11-25 | Disposition: A | Discharge status: Home / Self Care | Payer: No Typology Code available for payment source | Source: Ambulatory Visit | Attending: Family Medicine | Admitting: Family Medicine

## 2017-11-25 DIAGNOSIS — R591 Generalized enlarged lymph nodes: Secondary | ICD-10-CM

## 2017-11-30 NOTE — Progress Notes (Signed)
Please inform patient of the following:  His ultrasound was inconclusive. The radiologist recommend neck CT for further evaluation.  Please place order for neck CT with contrast for lymphadenopathy.  Algis Greenhouse. [name], MD 11/30/2017 4:30 PM

## 2017-12-01 ENCOUNTER — Other Ambulatory Visit: Payer: Self-pay

## 2017-12-01 DIAGNOSIS — R591 Generalized enlarged lymph nodes: Secondary | ICD-10-CM

## 2017-12-01 DIAGNOSIS — L049 Acute lymphadenitis, unspecified: Secondary | ICD-10-CM

## 2017-12-06 ENCOUNTER — Other Ambulatory Visit: Payer: Self-pay | Admitting: Family Medicine

## 2017-12-20 ENCOUNTER — Other Ambulatory Visit: Payer: Self-pay

## 2017-12-24 ENCOUNTER — Ambulatory Visit
Admission: RE | Admit: 2017-12-24 | Discharge: 2017-12-24 | Disposition: A | Discharge status: Home / Self Care | Payer: Self-pay | Source: Ambulatory Visit | Attending: Family Medicine | Admitting: Family Medicine

## 2017-12-24 DIAGNOSIS — R591 Generalized enlarged lymph nodes: Secondary | ICD-10-CM

## 2017-12-24 DIAGNOSIS — L049 Acute lymphadenitis, unspecified: Secondary | ICD-10-CM

## 2017-12-24 MED ORDER — IOPAMIDOL (ISOVUE-300) INJECTION 61%
75.0000 mL | Freq: Once | INTRAVENOUS | Status: AC | PRN
  Administered 2017-12-24: 75 mL via INTRAVENOUS

## 2017-12-24 NOTE — Progress Notes (Signed)
Please ask patient to schedule an appointment soon to discuss his results and next steps.  Algis Greenhouse. [name], MD 12/24/2017 8:21 PM

## 2017-12-28 ENCOUNTER — Ambulatory Visit (INDEPENDENT_AMBULATORY_CARE_PROVIDER_SITE_OTHER): Payer: Self-pay | Admitting: Family Medicine

## 2017-12-28 ENCOUNTER — Encounter: Payer: Self-pay | Admitting: Family Medicine

## 2017-12-28 VITALS — BP 132/84 | HR 85 | Temp 98.2°F | Ht 69.0 in | Wt 230.8 lb

## 2017-12-28 DIAGNOSIS — R0602 Shortness of breath: Secondary | ICD-10-CM

## 2017-12-28 DIAGNOSIS — J439 Emphysema, unspecified: Secondary | ICD-10-CM

## 2017-12-28 DIAGNOSIS — G4700 Insomnia, unspecified: Secondary | ICD-10-CM

## 2017-12-28 DIAGNOSIS — R918 Other nonspecific abnormal finding of lung field: Secondary | ICD-10-CM

## 2017-12-28 DIAGNOSIS — Z23 Encounter for immunization: Secondary | ICD-10-CM

## 2017-12-28 DIAGNOSIS — I700 Atherosclerosis of aorta: Secondary | ICD-10-CM

## 2017-12-28 MED ORDER — TIOTROPIUM BROMIDE MONOHYDRATE 2.5 MCG/ACT IN AERS: 2.0000 | INHALATION_SPRAY | Freq: Every day | RESPIRATORY_TRACT | 3 refills | Status: AC

## 2017-12-28 MED ORDER — ALBUTEROL SULFATE HFA 108 (90 BASE) MCG/ACT IN AERS: 2.0000 | INHALATION_SPRAY | Freq: Four times a day (QID) | RESPIRATORY_TRACT | 0 refills | Status: DC | PRN

## 2017-12-28 MED ORDER — NORTRIPTYLINE HCL 75 MG PO CAPS: 150.0000 mg | ORAL_CAPSULE | Freq: Every day | ORAL | 1 refills | Status: AC

## 2017-12-28 NOTE — Progress Notes (Signed)
   Subjective:  Michael Mcfarland is a 52 y.o. male who presents today with a chief complaint of lymphadenopathy.   HPI:  Lymphadenopathy, established problem Patient seen about 6-weeks ago for this.  Since his last visit, he has had an ultrasound of the area and a neck CT performed last week.  He has had continued swelling to the left side of his neck in addition to a hoarse voice.  Symptoms seem to be worsening.  Insomnia/depression, established problem Patient does not think his amitriptyline is helping anymore.  He is interested in switching to a different medication.  Still has a significant amount of difficulty falling asleep at night.  Shortness of breath/dyspnea on exertion Symptoms have been progressive over the last several weeks.  He is okay at rest however becomes very short of breath during mild to moderate activity.  No reported chest pain.  No reported nausea or vomiting.  ROS: Per HPI  PMH: He reports that he has been smoking cigarettes. He has a 8.75 pack-year smoking history. He has never used smokeless tobacco. He reports that he drank about 18.0 standard drinks of alcohol per week. He reports that he has current or past drug history.  Objective:  Physical Exam: BP 132/84 (BP Location: Left Arm, Patient Position: Sitting, Cuff Size: Normal)   Pulse 85   Temp 98.2 F (36.8 C) (Oral)   Ht 5\' 9"  (1.753 m)   Wt 230 lb 12.8 oz (104.7 kg)   SpO2 92%   BMI 34.08 kg/m   Gen: NAD, resting comfortably HEENT: Supraclavicular and anterior cervical lymphadenopathy noted. CV: RRR with no murmurs appreciated Pulm: NWOB, CTAB with no crackles, wheezes, or rhonchi  Assessment/Plan:  Pulmonary nodules Found on patient's neck CT. Concern for metastatic disease given his lymphadenopathy and smoking history.  Discussed possibility that this could represent malignancy with patient and his family, however we will need more tests for definitive diagnosis.  They voiced understanding.   Radiology recommended CT scan for further evaluation.  This order was placed today.  Depending on results of CT scan will likely need tissue sample.  Referral to pulmonology was placed today pending results of the CT scan.  Pulmonary emphysema ([place]) Noted on his CT scan.  Likely the source of his dyspnea on exertion.  No other red flag signs or symptoms.  Patient was given a breathing treatment in office today with modest improvement in his symptoms.  We will start him on albuterol inhaler and Spiriva.  Sample of Spiriva was given in office today.  Will place referral to pulmonology for PFTs and further evaluation/management.  Insomnia Will switch from amitriptyline 100 mg nightly to nortriptyline 150 mg nightly.  Aortic atherosclerosis ([place]) Incidentally found on CT scan.  Not fully addressed today due to more pressing concerns however will need lipid panel checked soon.  Would likely benefit from starting statin depending on results.  Preventive health care Flu shot given today.  Michael Mcfarland. [name], MD 12/28/2017 1:17 PM

## 2017-12-28 NOTE — Telephone Encounter (Signed)
Patients sister called to schedule an appointment and the first available was 01/03/18. She states he needs to be sooner than this due to the CT results. Please contact to schedule.  [phone]

## 2017-12-28 NOTE — Telephone Encounter (Signed)
I have scheduled the patient for an appointment today, 12/28/2017.

## 2017-12-28 NOTE — Assessment & Plan Note (Signed)
Incidentally found on CT scan.  Not fully addressed today due to more pressing concerns however will need lipid panel checked soon.  Would likely benefit from starting statin depending on results.

## 2017-12-28 NOTE — Assessment & Plan Note (Addendum)
Found on patient's neck CT. Concern for metastatic disease given his lymphadenopathy and smoking history.  Discussed possibility that this could represent malignancy with patient and his family, however we will need more tests for definitive diagnosis.  They voiced understanding.  Radiology recommended CT scan for further evaluation.  This order was placed today.  Depending on results of CT scan will likely need tissue sample.  Referral to pulmonology was placed today pending results of the CT scan.

## 2017-12-28 NOTE — Patient Instructions (Addendum)
It was very nice to see you today!  Please stop the amitriptyline and start the Pamelor.  Please take 2 pills daily.  You have a few consolidated areas in your chest and neck.  You need to get more information before we have a definitive diagnosis.  We will place a order for a chest CT scan to take a closer look.  We will also refer you to a pulmonologist.  We will give you 2 inhalers today. Please use the albuterol as needed for shortness of breath. Please use the Spiriva 2 puffs daily.  We will contact with the results when they are available.  Take care, Dr [name]

## 2017-12-28 NOTE — Assessment & Plan Note (Signed)
Will switch from amitriptyline 100 mg nightly to nortriptyline 150 mg nightly.

## 2017-12-28 NOTE — Assessment & Plan Note (Signed)
Noted on his CT scan.  Likely the source of his dyspnea on exertion.  No other red flag signs or symptoms.  Patient was given a breathing treatment in office today with modest improvement in his symptoms.  We will start him on albuterol inhaler and Spiriva.  Sample of Spiriva was given in office today.  Will place referral to pulmonology for PFTs and further evaluation/management.

## 2017-12-30 ENCOUNTER — Telehealth: Payer: Self-pay | Admitting: Family Medicine

## 2017-12-30 NOTE — Telephone Encounter (Signed)
Please advise 

## 2017-12-30 NOTE — Telephone Encounter (Signed)
See note

## 2017-12-30 NOTE — Telephone Encounter (Signed)
Copied from [place] [id number]. Topic: Quick Communication - See Telephone Encounter >> Dec 30, 2017 10:56 AM Rutherford Nail, NT wrote: CRM for notification. See Telephone encounter for: 12/30/17. Patient's sister, Mariann Laster, calling to leave a message for [employer] and Dr [name]. States that Dr [name] prescribed Tiotropium Bromide Monohydrate (SPIRIVA RESPIMAT) 2.5 MCG/ACT AERS that was going to be $400 with his goodRX card. Is there anything else that could be prescribed? States that she took Zenia Resides to his psychologist appointment and he told her that he needed to be committed to psych ward at [facility]. He wanted her to let Dr [name] know that and see what he thought. Said that he was a danger to himself and to other and needed to be treated. Once he was with [facility] for a while, could go to assisted living facility. Request that psychologist to send report to Dr [name], so that should be on it way or already at office. CB#: [phone]

## 2017-12-31 ENCOUNTER — Other Ambulatory Visit: Payer: Self-pay

## 2017-12-31 NOTE — Telephone Encounter (Signed)
Have not been able to review his psychologist report yet - but would encourage him to go to psych at [facility] if he is having any thoughts of hurting himself or anyone else.   Not aware of any cheap alternatives. Pulmonology may have some solutions - we can give him enough sample to last him until he is seen there. May also be worthwhile for him to look into [employer] or [employer] assistance.  Algis Greenhouse. [name], MD 12/31/2017 10:20 AM

## 2017-12-31 NOTE — Telephone Encounter (Signed)
Spoke with patient's sister.  She advised that the patient has been walking around naked and has been acting mean toward their other sister whom he lives with.  She is afraid he may hurt the other sister, Ivin Booty, or harm himself.  She was asking if she should call 9-1-1 and have the ambulance transport him to the hospital for his own safety.  I advised that she should do that, especially because he is a danger to himself and to others at this time.  Mariann Laster advised that she will contact EMS to have patient transported to the hospital.

## 2017-12-31 NOTE — Telephone Encounter (Signed)
Patient's sister Mariann Laster (who is on the [facility]) came into the office with tears in her eyes stating that she was concerned that her brother was "out of his head and unstable." She stated that "he hadn't slept in a few nights and needs EMS called." I immediately went to notify the clinical team Advertising account planner and Dr. [name]) and Luetta Nutting came to take her to a treatment room to discuss further and assist.

## 2018-01-01 ENCOUNTER — Emergency Department (HOSPITAL_COMMUNITY): Payer: Self-pay

## 2018-01-01 ENCOUNTER — Observation Stay (HOSPITAL_COMMUNITY)
Admission: EM | Admit: 2018-01-01 | Discharge: 2018-01-03 | Disposition: A | Discharge status: Home / Self Care | Payer: Self-pay | Attending: Family Medicine | Admitting: Family Medicine

## 2018-01-01 ENCOUNTER — Other Ambulatory Visit: Payer: Self-pay

## 2018-01-01 ENCOUNTER — Encounter (HOSPITAL_COMMUNITY): Payer: Self-pay | Admitting: *Deleted

## 2018-01-01 DIAGNOSIS — R030 Elevated blood-pressure reading, without diagnosis of hypertension: Secondary | ICD-10-CM

## 2018-01-01 DIAGNOSIS — K7030 Alcoholic cirrhosis of liver without ascites: Secondary | ICD-10-CM | POA: Insufficient documentation

## 2018-01-01 DIAGNOSIS — G4700 Insomnia, unspecified: Secondary | ICD-10-CM | POA: Insufficient documentation

## 2018-01-01 DIAGNOSIS — K219 Gastro-esophageal reflux disease without esophagitis: Secondary | ICD-10-CM | POA: Insufficient documentation

## 2018-01-01 DIAGNOSIS — J810 Acute pulmonary edema: Secondary | ICD-10-CM

## 2018-01-01 DIAGNOSIS — C801 Malignant (primary) neoplasm, unspecified: Secondary | ICD-10-CM | POA: Insufficient documentation

## 2018-01-01 DIAGNOSIS — Z79899 Other long term (current) drug therapy: Secondary | ICD-10-CM | POA: Insufficient documentation

## 2018-01-01 DIAGNOSIS — D696 Thrombocytopenia, unspecified: Secondary | ICD-10-CM | POA: Diagnosis present

## 2018-01-01 DIAGNOSIS — G9341 Metabolic encephalopathy: Secondary | ICD-10-CM

## 2018-01-01 DIAGNOSIS — F329 Major depressive disorder, single episode, unspecified: Secondary | ICD-10-CM | POA: Insufficient documentation

## 2018-01-01 DIAGNOSIS — F17200 Nicotine dependence, unspecified, uncomplicated: Secondary | ICD-10-CM | POA: Diagnosis present

## 2018-01-01 DIAGNOSIS — R410 Disorientation, unspecified: Secondary | ICD-10-CM | POA: Diagnosis present

## 2018-01-01 DIAGNOSIS — R918 Other nonspecific abnormal finding of lung field: Secondary | ICD-10-CM | POA: Diagnosis present

## 2018-01-01 DIAGNOSIS — Z8673 Personal history of transient ischemic attack (TIA), and cerebral infarction without residual deficits: Secondary | ICD-10-CM | POA: Insufficient documentation

## 2018-01-01 DIAGNOSIS — D6959 Other secondary thrombocytopenia: Secondary | ICD-10-CM | POA: Insufficient documentation

## 2018-01-01 DIAGNOSIS — I8510 Secondary esophageal varices without bleeding: Secondary | ICD-10-CM

## 2018-01-01 DIAGNOSIS — J441 Chronic obstructive pulmonary disease with (acute) exacerbation: Secondary | ICD-10-CM

## 2018-01-01 DIAGNOSIS — K7460 Unspecified cirrhosis of liver: Secondary | ICD-10-CM | POA: Diagnosis present

## 2018-01-01 DIAGNOSIS — Z791 Long term (current) use of non-steroidal anti-inflammatories (NSAID): Secondary | ICD-10-CM | POA: Insufficient documentation

## 2018-01-01 DIAGNOSIS — F10231 Alcohol dependence with withdrawal delirium: Principal | ICD-10-CM | POA: Insufficient documentation

## 2018-01-01 DIAGNOSIS — F17210 Nicotine dependence, cigarettes, uncomplicated: Secondary | ICD-10-CM | POA: Insufficient documentation

## 2018-01-01 DIAGNOSIS — C770 Secondary and unspecified malignant neoplasm of lymph nodes of head, face and neck: Secondary | ICD-10-CM | POA: Insufficient documentation

## 2018-01-01 DIAGNOSIS — R0602 Shortness of breath: Secondary | ICD-10-CM

## 2018-01-01 DIAGNOSIS — R221 Localized swelling, mass and lump, neck: Secondary | ICD-10-CM

## 2018-01-01 DIAGNOSIS — I10 Essential (primary) hypertension: Secondary | ICD-10-CM | POA: Insufficient documentation

## 2018-01-01 LAB — CBC WITH DIFFERENTIAL/PLATELET
BASOS PCT: 1 %
Basophils Absolute: 0.1 10*3/uL (ref 0.0–0.1)
EOS ABS: 0.2 10*3/uL (ref 0.0–0.7)
Eosinophils Relative: 3 %
HCT: 48.6 % (ref 39.0–52.0)
HEMOGLOBIN: 16.5 g/dL (ref 13.0–17.0)
Lymphocytes Relative: 24 %
Lymphs Abs: 1.8 10*3/uL (ref 0.7–4.0)
MCH: 33.7 pg (ref 26.0–34.0)
MCHC: 34 g/dL (ref 30.0–36.0)
MCV: 99.4 fL (ref 78.0–100.0)
Monocytes Absolute: 0.7 10*3/uL (ref 0.1–1.0)
Monocytes Relative: 9 %
NEUTROS PCT: 63 %
Neutro Abs: 4.6 10*3/uL (ref 1.7–7.7)
Platelets: 121 10*3/uL — ABNORMAL LOW (ref 150–400)
RBC: 4.89 MIL/uL (ref 4.22–5.81)
RDW: 15.5 % (ref 11.5–15.5)
WBC: 7.4 10*3/uL (ref 4.0–10.5)

## 2018-01-01 LAB — COMPREHENSIVE METABOLIC PANEL
ALK PHOS: 151 U/L — AB (ref 38–126)
ALT: 21 U/L (ref 0–44)
AST: 59 U/L — AB (ref 15–41)
Albumin: 3.3 g/dL — ABNORMAL LOW (ref 3.5–5.0)
Anion gap: 11 (ref 5–15)
BUN: 14 mg/dL (ref 6–20)
CHLORIDE: 106 mmol/L (ref 98–111)
CO2: 25 mmol/L (ref 22–32)
CREATININE: 0.93 mg/dL (ref 0.61–1.24)
Calcium: 10.1 mg/dL (ref 8.9–10.3)
GFR calc Af Amer: >60 mL/min (ref >60)
GFR calc non Af Amer: >60 mL/min (ref >60)
Glucose, Bld: 81 mg/dL (ref 70–99)
Potassium: 4 mmol/L (ref 3.5–5.1)
Sodium: 142 mmol/L (ref 135–145)
Total Bilirubin: 2.4 mg/dL — ABNORMAL HIGH (ref 0.3–1.2)
Total Protein: 7.1 g/dL (ref 6.5–8.1)

## 2018-01-01 LAB — URINALYSIS, ROUTINE W REFLEX MICROSCOPIC
Bilirubin Urine: NEGATIVE
GLUCOSE, UA: NEGATIVE mg/dL
Hgb urine dipstick: NEGATIVE
KETONES UR: NEGATIVE mg/dL
LEUKOCYTES UA: NEGATIVE
NITRITE: NEGATIVE
PROTEIN: NEGATIVE mg/dL
Specific Gravity, Urine: 1.021 (ref 1.005–1.030)
pH: 5 (ref 5.0–8.0)

## 2018-01-01 LAB — SALICYLATE LEVEL

## 2018-01-01 LAB — ACETAMINOPHEN LEVEL

## 2018-01-01 LAB — BLOOD GAS, ARTERIAL
ACID-BASE EXCESS: 3.5 mmol/L — AB (ref 0.0–2.0)
BICARBONATE: 26.5 mmol/L (ref 20.0–28.0)
DRAWN BY: 295031
FIO2: 21
O2 SAT: 91.7 %
PCO2 ART: 36.8 mmHg (ref 32.0–48.0)
Patient temperature: 98.6
pH, Arterial: 7.471 — ABNORMAL HIGH (ref 7.350–7.450)
pO2, Arterial: 61.3 mmHg — ABNORMAL LOW (ref 83.0–108.0)

## 2018-01-01 LAB — RAPID URINE DRUG SCREEN, HOSP PERFORMED
AMPHETAMINES: NOT DETECTED
Barbiturates: NOT DETECTED
Benzodiazepines: NOT DETECTED
Cocaine: NOT DETECTED
Opiates: NOT DETECTED
TETRAHYDROCANNABINOL: NOT DETECTED

## 2018-01-01 LAB — BRAIN NATRIURETIC PEPTIDE: B Natriuretic Peptide: 357.7 pg/mL — ABNORMAL HIGH (ref 0.0–100.0)

## 2018-01-01 LAB — ETHANOL: Alcohol, Ethyl (B): <10 mg/dL (ref <10)

## 2018-01-01 LAB — PROTIME-INR
INR: 1.4
PROTHROMBIN TIME: 17.1 s — AB (ref 11.4–15.2)

## 2018-01-01 LAB — AMMONIA: Ammonia: 41 umol/L — ABNORMAL HIGH (ref 9–35)

## 2018-01-01 LAB — TROPONIN I: Troponin I: <0.03 ng/mL (ref <0.03)

## 2018-01-01 MED ORDER — LACTULOSE 10 GM/15ML PO SOLN: 20.0000 g | Freq: Every day | ORAL | Status: DC

## 2018-01-01 MED ORDER — PREDNISONE 20 MG PO TABS
40.0000 mg | ORAL_TABLET | Freq: Every day | ORAL | Status: DC
  Administered 2018-01-02 – 2018-01-03 (×2): 40 mg via ORAL
  Filled 2018-01-01 (×2): qty 2

## 2018-01-01 MED ORDER — TIOTROPIUM BROMIDE MONOHYDRATE 18 MCG IN CAPS
1.0000 | ORAL_CAPSULE | Freq: Every morning | RESPIRATORY_TRACT | Status: DC
  Administered 2018-01-02 – 2018-01-03 (×2): 18 ug via RESPIRATORY_TRACT
  Filled 2018-01-01: qty 5

## 2018-01-01 MED ORDER — PANTOPRAZOLE SODIUM 40 MG PO TBEC
40.0000 mg | DELAYED_RELEASE_TABLET | Freq: Every day | ORAL | Status: DC
  Administered 2018-01-02 – 2018-01-03 (×2): 40 mg via ORAL
  Filled 2018-01-01 (×2): qty 1

## 2018-01-01 MED ORDER — IOHEXOL 300 MG/ML  SOLN
75.0000 mL | Freq: Once | INTRAMUSCULAR | Status: AC | PRN
  Administered 2018-01-01: 75 mL via INTRAVENOUS

## 2018-01-01 MED ORDER — LORAZEPAM 2 MG/ML IJ SOLN: 0.0000 mg | Freq: Two times a day (BID) | INTRAMUSCULAR | Status: DC

## 2018-01-01 MED ORDER — THIAMINE HCL 100 MG/ML IJ SOLN
100.0000 mg | Freq: Every day | INTRAMUSCULAR | Status: DC
  Administered 2018-01-03: 100 mg via INTRAVENOUS
  Filled 2018-01-01: qty 2

## 2018-01-01 MED ORDER — LORAZEPAM 1 MG PO TABS: 0.0000 mg | ORAL_TABLET | Freq: Two times a day (BID) | ORAL | Status: DC

## 2018-01-01 MED ORDER — METHYLPREDNISOLONE SODIUM SUCC 125 MG IJ SOLR
125.0000 mg | Freq: Once | INTRAMUSCULAR | Status: AC
  Administered 2018-01-01: 125 mg via INTRAVENOUS
  Filled 2018-01-01: qty 2

## 2018-01-01 MED ORDER — FOLIC ACID 1 MG PO TABS
1.0000 mg | ORAL_TABLET | Freq: Every day | ORAL | Status: DC
  Administered 2018-01-01 – 2018-01-03 (×3): 1 mg via ORAL
  Filled 2018-01-01 (×3): qty 1

## 2018-01-01 MED ORDER — LORAZEPAM 1 MG PO TABS: 0.0000 mg | ORAL_TABLET | Freq: Four times a day (QID) | ORAL | Status: AC

## 2018-01-01 MED ORDER — VITAMIN B-1 100 MG PO TABS
100.0000 mg | ORAL_TABLET | Freq: Every day | ORAL | Status: DC
  Administered 2018-01-02: 100 mg via ORAL
  Filled 2018-01-01 (×2): qty 1

## 2018-01-01 MED ORDER — LACTULOSE 10 GM/15ML PO SOLN
20.0000 g | Freq: Two times a day (BID) | ORAL | Status: DC
  Administered 2018-01-01 – 2018-01-03 (×4): 20 g via ORAL
  Filled 2018-01-01 (×4): qty 30

## 2018-01-01 MED ORDER — FUROSEMIDE 40 MG PO TABS
20.0000 mg | ORAL_TABLET | Freq: Every day | ORAL | Status: DC
  Administered 2018-01-02: 20 mg via ORAL
  Filled 2018-01-01: qty 1

## 2018-01-01 MED ORDER — AZITHROMYCIN 250 MG PO TABS
250.0000 mg | ORAL_TABLET | Freq: Every day | ORAL | Status: AC
  Administered 2018-01-01 – 2018-01-03 (×3): 250 mg via ORAL
  Filled 2018-01-01 (×3): qty 1

## 2018-01-01 MED ORDER — IPRATROPIUM BROMIDE 0.02 % IN SOLN
0.5000 mg | Freq: Once | RESPIRATORY_TRACT | Status: AC
  Administered 2018-01-01: 0.5 mg via RESPIRATORY_TRACT
  Filled 2018-01-01: qty 2.5

## 2018-01-01 MED ORDER — IPRATROPIUM-ALBUTEROL 0.5-2.5 (3) MG/3ML IN SOLN: 3.0000 mL | Freq: Three times a day (TID) | RESPIRATORY_TRACT | Status: DC

## 2018-01-01 MED ORDER — NORTRIPTYLINE HCL 25 MG PO CAPS
150.0000 mg | ORAL_CAPSULE | Freq: Every day | ORAL | Status: DC
  Administered 2018-01-01 – 2018-01-02 (×2): 150 mg via ORAL
  Filled 2018-01-01 (×3): qty 6

## 2018-01-01 MED ORDER — NADOLOL 20 MG PO TABS
20.0000 mg | ORAL_TABLET | Freq: Every day | ORAL | Status: DC
  Administered 2018-01-02 – 2018-01-03 (×2): 20 mg via ORAL
  Filled 2018-01-01 (×3): qty 1

## 2018-01-01 MED ORDER — ENOXAPARIN SODIUM 40 MG/0.4ML ~~LOC~~ SOLN
40.0000 mg | SUBCUTANEOUS | Status: DC
  Administered 2018-01-02: 40 mg via SUBCUTANEOUS
  Filled 2018-01-01: qty 0.4

## 2018-01-01 MED ORDER — IPRATROPIUM-ALBUTEROL 0.5-2.5 (3) MG/3ML IN SOLN
RESPIRATORY_TRACT | Status: AC
  Filled 2018-01-01: qty 3

## 2018-01-01 MED ORDER — ALBUTEROL SULFATE (2.5 MG/3ML) 0.083% IN NEBU
5.0000 mg | INHALATION_SOLUTION | Freq: Once | RESPIRATORY_TRACT | Status: AC
  Administered 2018-01-01: 5 mg via RESPIRATORY_TRACT
  Filled 2018-01-01: qty 6

## 2018-01-01 MED ORDER — IPRATROPIUM-ALBUTEROL 0.5-2.5 (3) MG/3ML IN SOLN: 3.0000 mL | Freq: Four times a day (QID) | RESPIRATORY_TRACT | Status: DC | PRN

## 2018-01-01 MED ORDER — LORAZEPAM 2 MG/ML IJ SOLN: 0.0000 mg | Freq: Four times a day (QID) | INTRAMUSCULAR | Status: AC

## 2018-01-01 MED ORDER — FUROSEMIDE 10 MG/ML IJ SOLN
40.0000 mg | Freq: Once | INTRAMUSCULAR | Status: AC
  Administered 2018-01-01: 40 mg via INTRAVENOUS
  Filled 2018-01-01: qty 4

## 2018-01-01 NOTE — ED Notes (Signed)
Bed: WA04 Expected date:  Expected time:  Means of arrival:  Comments: 

## 2018-01-01 NOTE — ED Notes (Signed)
Pt was changed out of clothes into paper scrubs. Pts belongings locked up in locker #28. Belongings: sneakers, white shirt, blue shirt, jeans, jean long sleeved shirt, white socks, black [employer] cell phone, inhaler.

## 2018-01-01 NOTE — ED Notes (Signed)
Pt O2 stayed 92% on room air while ambulating. Pt states that is is SOB when walking. Pt got back to room and sat down O2 went to 88% for about 1-2 minutes before returning back to 91%.

## 2018-01-01 NOTE — ED Notes (Signed)
NT arrived to draw blood, pt had iv in right hand. Told that blood had been drawn from the iv, directed to draw blood from the next pt on the list.

## 2018-01-01 NOTE — ED Notes (Signed)
Pt changing into burgundy paper scrubs at this time.

## 2018-01-01 NOTE — ED Triage Notes (Addendum)
Patient brought here by EMS c/o lower back pain, and SOB for 2 months.  Family concerned about patient's mental health as he has been more depressed, running around the house naked and drink more alcohol than usual.  Patient had a CT at [facility] on Friday for a lump in his throat.

## 2018-01-01 NOTE — ED Notes (Signed)
Report given to [facility].  Patient moved to room 4 in main ED

## 2018-01-01 NOTE — ED Notes (Signed)
Report given to [facility].

## 2018-01-01 NOTE — H&P (Signed)
History and Physical  [facility]Y [facility] JOA:[id number] DOB: 1965/10/20 DOA: 01/01/2018 1332  Referring physician: Langston Masker [facility] ED) PCP: [name], MD   HISTORY   Chief Complaint: transient delirium, dyspnea and relative hypoxia  HPI: Michael Mcfarland is a 52 y.o. male with hx of alcoholism with hx of withdrawal seizures, alcoholic cirrhosis, prior SDH, HTN, COPD and recently discovered cervical LAD and hoarseness x 2 months concerning for metastatic disease who presented with episodes of delirium. Per ED provider, his sister brought him to [facility] ED due to episodes of confusion and strange behavior for the past several weeks. Of note he has also had neck swelling, mild dysphagia, hoarsness x 2 months and a recent CT soft neck ordered by PCP showed extensive cervical LAD and L vocal paresis concerning for a primary lung malignancy. Outpatient CT chest was ordered but not yet completed. Patient was calm and cooperative during my exam and does recall any particular event that has brought him to the hospital, although does report neck discomfort and increasing SOB. He has still been able to maintain PO intake. Last drink was yesterday ~ per sister's report he drinks about 8 bottles of wine/day. Denies SI/HI or auditory or visual hallucinations.   Review of Systems:  + dyspnea, hoarseness + reported prior episodes of altered mentation - no fevers/chills - no cough - no chest pain  - no edema, PND, orthopnea - no nausea/vomiting; no tarry, melanotic or bloody stools - no dysuria, increased urinary frequency - no weight changes Rest of systems reviewed are negative, except as per above history.   ED course:  Vitals Blood pressure (!) 147/97, pulse 79, temperature 97.6 F (36.4 C), temperature source Oral, resp. rate (!) 24, SpO2 92 %. Reported desat to 88%while ambulating to restroom. SO2 was 94% during my exam.  Received albulterol and atrovent nebs x2; lasix 40mg  IV x 1; solumedrol 125mg   x 1;   Past Medical History:  Diagnosis Date  . Alcohol abuse    /notes 01/18/2017  . Alcohol related seizure ([place]) 01/17/2017   Archie Endo 01/17/2017  . Anxiety   . Arthritis   . Cirrhosis, alcoholic ([employer]) 60/1093  . COPD (chronic obstructive pulmonary disease) ([place])   . Depression   . Emphysema lung ([place])   . Frequent headaches   . GERD (gastroesophageal reflux disease)   . Hay fever   . Hepatitis B   . IBS (irritable bowel syndrome)   . Migraine    frequency "depends on the weather" (01/19/2017)  . Panic attacks   . Pneumonia   . Portal hypertensive gastropathy ([place])   . Seizure ([place]) 05/20/2017 X 2  . Stroke [facility])    Past Surgical History:  Procedure Laterality Date  . ANTERIOR CERVICAL DECOMP/DISCECTOMY FUSION    . ESOPHAGOGASTRODUODENOSCOPY N/A 06/18/2017   Procedure: ESOPHAGOGASTRODUODENOSCOPY (EGD);  Surgeon: Doran Stabler, MD;  Location: [place];  Service: Gastroenterology;  Laterality: N/A;  . LUMBAR DISC SURGERY      Social History:  reports that he has been smoking cigarettes. He has a 8.75 pack-year smoking history. He has never used smokeless tobacco. He reports that he drank about 18.0 standard drinks of alcohol per week. He reports that he has current or past drug history.  No Known Allergies  Family History  Problem Relation Age of Onset  . Diabetes Mother   . Depression Mother   . Hypertension Mother   . Arthritis Mother   . Arthritis Father   .  Heart disease Father   . Liver disease Sister        fatty liver  . Heart disease Brother   . Heart attack Paternal Grandfather   . Diabetes Sister       Prior to Admission medications   Medication Sig Start Date End Date Taking? Authorizing Provider  albuterol (PROVENTIL HFA;VENTOLIN HFA) 108 (90 Base) MCG/ACT inhaler Inhale 2 puffs into the lungs every 6 (six) hours as needed for wheezing or shortness of breath. 12/28/17  Yes [name], MD  baclofen (LIORESAL) 10 MG tablet Take 1 tablet  (10 mg total) by mouth 3 (three) times daily. 11/12/17  Yes [name], MD  Chlorpheniramine Maleate (ALLERGY PO) Take 1 tablet by mouth daily as needed (allergies).   Yes [provider]  diclofenac (VOLTAREN) 50 MG EC tablet Take 1 tablet (50 mg total) by mouth 2 (two) times daily. 11/12/17  Yes [name], MD  feeding supplement, ENSURE ENLIVE, (ENSURE ENLIVE) LIQD Take 237 mLs by mouth 2 (two) times daily between meals. 06/18/17  Yes Kayleen Memos, DO  folic acid (FOLVITE) 1 MG tablet Take 1 tablet (1 mg total) by mouth daily. 07/24/17  Yes Emokpae, Courage, MD  ipratropium (ATROVENT) 0.06 % nasal spray Place 2 sprays into both nostrils 4 (four) times daily. 11/12/17  Yes [name], MD  lactulose [facility]) 10 GM/15ML solution Take 16mL one to three times daily to titrate for 2-3 soft stools per day 11/12/17  Yes [name], MD  Multiple Vitamin (MULTIVITAMIN WITH MINERALS) TABS tablet Take 1 tablet by mouth daily. 07/25/17  Yes Emokpae, Courage, MD  nadolol (CORGARD) 20 MG tablet Take 1 tablet (20 mg total) by mouth daily. 11/12/17  Yes [name], MD  nortriptyline (PAMELOR) 75 MG capsule Take 2 capsules (150 mg total) by mouth at bedtime. 12/28/17  Yes [name], MD  nystatin (MYCOSTATIN/NYSTOP) powder Apply topically 4 (four) times daily. 11/12/17  Yes [name], MD  pantoprazole (PROTONIX) 40 MG tablet Take 1 tablet (40 mg total) by mouth daily at 12 noon. 07/08/17  Yes [name], MD  Tiotropium Bromide Monohydrate (SPIRIVA RESPIMAT) 2.5 MCG/ACT AERS Inhale 2 puffs into the lungs daily. 12/28/17  Yes [name], MD    PHYSICAL EXAM   Temp:  [97.6 F (36.4 C)] 97.6 F (36.4 C) (10/05 1352) Pulse Rate:  [71-79] 79 (10/05 1725) Resp:  [20-24] 24 (10/05 1725) BP: (147-161)/(97-101) 147/97 (10/05 1725) SpO2:  [92 %-96 %] 92 % (10/05 1816)  BP (!) 147/97 (BP Location: Left Arm)   Pulse 79   Temp 97.6 F (36.4 C) (Oral)   Resp (!) 24    SpO2 92%    GEN obese middle-aged caucasian male; sitting comfortably in bed; awake and calm; answers questions appropriately  HEENT NCAT EOM intact PERRL; clear oropharynx; hyperpigmented large tongue; +hard fixed L supraclavicular/lower cervical LN estimated 2.5 cm; moist mucus membranes  Voice hoarse during exam JVP estimated 5 cm H2O above RA; no HJR ; no carotid bruits b/l ;  CV regular normal rate; quiet S1 and S2; no m/r/g or S3/S4; PMI non displaced; no parasternal heave  RESP CTA b/l; breathing unlabored and symmetric  ABD soft NT ND +normoactive BS  EXT warm throughout b/l; no peripheral edema b/l  PULSES  DP and radials 2+ intact b/l  SKIN/MSK ruddy throughout; no rashes or lesions  NEURO/PSYCH AAOx3; no focal deficits; no asterixis  DATA   LABS ON ADMISSION:  Basic Metabolic Panel: Recent Labs  Lab 01/01/18 1509  NA 142  K 4.0  CL 106  CO2 25  GLUCOSE 81  BUN 14  CREATININE 0.93  CALCIUM 10.1   CBC: Recent Labs  Lab 01/01/18 1509  WBC 7.4  NEUTROABS 4.6  HGB 16.5  HCT 48.6  MCV 99.4  PLT 121*   Liver Function Tests: Recent Labs  Lab 01/01/18 1509  AST 59*  ALT 21  ALKPHOS 151*  BILITOT 2.4*  PROT 7.1  ALBUMIN 3.3*   No results for input(s): LIPASE, AMYLASE in the last 168 hours. Recent Labs  Lab 01/01/18 1509  AMMONIA 41*   Coagulation:  Lab Results  Component Value Date   INR 1.40 01/01/2018   INR 1.38 07/16/2017   INR 1.44 06/15/2017   No results found for: PTT Lactic Acid, Venous:     Component Value Date/Time   LATICACIDVEN 1.34 06/15/2017 1457   ABG    Component Value Date/Time   PHART 7.471 (H) 01/01/2018 1805   PCO2ART 36.8 01/01/2018 1805   PO2ART 61.3 (L) 01/01/2018 1805   HCO3 26.5 01/01/2018 1805   O2SAT 91.7 01/01/2018 1805     Cardiac Enzymes: Recent Labs  Lab 01/01/18 1509  TROPONINI <0.03   Urinalysis:    Component Value Date/Time   COLORURINE AMBER (A) 01/01/2018 1509   APPEARANCEUR CLEAR  01/01/2018 1509   LABSPEC 1.021 01/01/2018 1509   PHURINE 5.0 01/01/2018 [place] 01/01/2018 1509   HGBUR NEGATIVE 01/01/2018 [place] 01/01/2018 [place] 01/01/2018 1509   PROTEINUR NEGATIVE 01/01/2018 1509   NITRITE NEGATIVE 01/01/2018 1509   LEUKOCYTESUR NEGATIVE 01/01/2018 1509   Results for JARYD, DREW A "ALLEN" (MRN [id number]) as of 01/01/2018 18:20  Ref. Range 01/01/2018 15:09  Alcohol, Ethyl (B) Latest Ref Range: <82 mg/dL <50  Salicylate Lvl Latest Ref Range: 2.8 - 30.0 mg/dL <7.0  Amphetamines Latest Ref Range: NONE DETECTED  NONE DETECTED  Barbiturates Latest Ref Range: NONE DETECTED  NONE DETECTED  Benzodiazepines Latest Ref Range: NONE DETECTED  NONE DETECTED  Opiates Latest Ref Range: NONE DETECTED  NONE DETECTED  COCAINE Latest Ref Range: NONE DETECTED  NONE DETECTED  Tetrahydrocannabinol Latest Ref Range: NONE DETECTED  NONE DETECTED    BNP (last 3 results) Recent Labs  Lab 01/01/18 1821  BNP 357.7*    No results for input(s): PROBNP in the last 8760 hours. CBG: No results for input(s): GLUCAP in the last 168 hours.  Radiological Exams on Admission: Dg Chest 2 View  Result Date: 01/01/2018 CLINICAL DATA:  Low back pain and shortness of breath for 2 months. EXAM: CHEST - 2 VIEW COMPARISON:  PA and lateral chest 06/15/2017, 05/20/2017 and 01/17/2017. FINDINGS: Hazy bilateral pulmonary opacities are identified. No pneumothorax or pleural effusion. Heart size is enlarged. No acute or focal bony abnormality. IMPRESSION: Hazy bilateral pulmonary opacities have an appearance most consistent with pulmonary edema. Electronically Signed   By: Inge Rise M.D.   On: 01/01/2018 16:33   Dg Lumbar Spine Complete  Result Date: 01/01/2018 CLINICAL DATA:  Low back pain EXAM: LUMBAR SPINE - COMPLETE 4+ VIEW COMPARISON:  07/31/2008 FINDINGS: Degenerative spurring anteriorly. Slight disc space narrowing at L5-S1. Moderate  compression deformity at T12, age indeterminate but new since 2010. No subluxation. SI joints are symmetric and unremarkable. IMPRESSION: Moderate compression deformity at T12, age indeterminate but new since 2010. Electronically Signed  By: Rolm Baptise M.D.   On: 01/01/2018 15:40   Ct Head Wo Contrast  Result Date: 01/01/2018 CLINICAL DATA:  Altered mental status EXAM: CT HEAD WITHOUT CONTRAST TECHNIQUE: Contiguous axial images were obtained from the base of the skull through the vertex without intravenous contrast. COMPARISON:  07/17/2017 FINDINGS: Brain: No acute intracranial abnormality. Specifically, no hemorrhage, hydrocephalus, mass lesion, acute infarction, or significant intracranial injury. Vascular: No hyperdense vessel or unexpected calcification. Skull: No acute calvarial abnormality. Sinuses/Orbits: Visualized paranasal sinuses and mastoids clear. Orbital soft tissues unremarkable. Other: None IMPRESSION: No acute intracranial abnormality. Electronically Signed   By: Rolm Baptise M.D.   On: 01/01/2018 15:41    CT soft tissue neck w contrast 12/24/17 IMPRESSION: 1. Left greater than right cervical and mediastinal lymphadenopathy consistent with metastatic disease. Chest CT is recommended to evaluate for a primary lung malignancy. 2. Findings suggesting left vocal cord paresis. 3. Small right upper lobe lung nodules measuring up to 5 mm in size. 4. Aortic Atherosclerosis (ICD10-I70.0) and Emphysema (ICD10-J43.9).  EKG: Independently reviewed. NSR, cannot rule out prior septal infarct (possible q waves V1,V2)   ASSESSMENT AND PLAN   Assessment: Michael Mcfarland is a 52 y.o. male with hx of alcoholism with hx of withdrawal seizures, alcoholic cirrhosis, prior SDH, HTN, COPD and recently discovered cervical LAD and hoarseness x 2 months concerning for metastatic disease who presented with episodes of delirium. On exam he was mildly hypoxic to mid 80s although respiratory status improved  on my exam post nebulizer treatment. No overt psychiatric or neurologic abnormalities on my initial exam; thus story is suggestive of delirium. Ammonia 40s (previously 59)  but no clinical sx of hepatic encephalopathy. O2 sats in mid 92-95% at rest after nebs. Will complete malignancy workup with CT chest while inpatient, continue COPD exacerbation treatment, and do metabolic workup. There may also be component of CHF per CXR. Note currently active alcohol abuse with prior withdrawals. Anticipate oncologic referral as outpatient.   Active Problems:   Delirium   COPD with acute exacerbation ([place])   Plan:   # Delirium episodes per family in setting of active alcohol abuse and possible underlying metabolic derangements  > normal CT head; ammonia 40s - TSH, B12 level pending - continue home lactulose ordered as 20mg  BID - urine drug screen ordered - possible lung malignancy workup and treatment for COPD exacerbation as below - no overt psychiatric sx at this time but if behavior changes/becomes concerning then consider psych eval  # Suspected primary lung malignancy > cervical LAD and L vocal cord paresis. Some mild reported dysphagia although PO intake maintained - CT chest w contrast pending - SLP ordered - anticipate outpatient oncology referral  # Depression and insomnia -- started on nortriptyline by PCP - continue nortriptyline 150mg  nightly - if metabolic/tox workup negative, may consider nortriptyline assoc anticholinergic delirium  # Dyspnea and mild hypoxia: COPD exacerbation plus possible pulm edema on CXR vs consolidation > improved with nebs in ED and received solumedrol x1; lasix 40mg  x 1 - continue nebsq8h - resume home spiriva - azithromycin x 3 days and prednisone 40mg  x 4 days - CT chest pending - BNP pending: if elevated, obtain cardiac TTE - update: TTE ordered - consider adding inhaled corticosteroid to regimen - added lasix 20mg  PO tomorrow standing  # Hx of  alcoholic cirrhosis and alcohol abuse + prior withdrawal seizures > thrombocytopenia at baseline 80-120s; prior known esophageal varices; Tbili 2.4 - CIWA protocol with prn ativan -  continue protonix - continue lactulose - thiamine and folate - continue nadolol 20mg  daily - if Cr stable tomorrow add spironolactone  # HTN on exam -- may be related to EtOH use/withdrawal > SBP140-160s - continue home nadolol - outpatient management   DVT Prophylaxis: lovenox Code Status:  Prior (limited = no CPR; intubation and BiPAP okay) Family Communication: ED spoke with sister  Disposition Plan: admit to obs to monitor neuro status and resp improvement   Patient contact: Extended Emergency Contact Information Primary Emergency Contact: [name] Mobile Phone: [phone] Relation: Sister  Time spent: > 50 minutes  Colbert Ewing, MD [facility] Pager [phone]  If 7PM-7AM, please contact night-coverage [email or web address] Password [email or web address] 01/01/2018, 6:23 PM

## 2018-01-01 NOTE — Progress Notes (Signed)
Patient arrived to unit via [place] by [place]. Admitted to Room 1511. Accompanied by sister, Mariann Laster. Patient histpry provided by sister, who is also patient's POA. Sister asked to provide copy of POA paperwork to facility. Patient oriented to unit and room. NAD at this time, resting in bed.

## 2018-01-01 NOTE — ED Notes (Signed)
ED TO INPATIENT HANDOFF REPORT  Name/Age/Gender Michael Mcfarland 52 y.o. male  Code Status    Code Status Orders  (From admission, onward)         Start     Ordered   01/01/18 1851  Limited resuscitation (code)  Continuous    Question Answer Comment  In the event of cardiac or respiratory ARREST: Initiate Code Blue, Call Rapid Response No   In the event of cardiac or respiratory ARREST: Perform CPR No   In the event of cardiac or respiratory ARREST: Perform Intubation/Mechanical Ventilation Yes   In the event of cardiac or respiratory ARREST: Use NIPPV/BiPAp only if indicated Yes   In the event of cardiac or respiratory ARREST: Administer ACLS medications if indicated No   In the event of cardiac or respiratory ARREST: Perform Defibrillation or Cardioversion if indicated No      01/01/18 1851        Code Status History    Date Active Date Inactive Code Status Order ID Comments User Context   07/18/2017 0952 07/24/2017 1834 Partial Code [id number]  Renee Pain, MD Inpatient   07/16/2017 2333 07/18/2017 0952 Full Code [id number]  Etta Quill, DO ED   06/15/2017 2107 06/22/2017 1555 Full Code [id number]  Ivor Costa, MD ED   05/20/2017 2037 05/23/2017 1610 Full Code [id number]  Jani Gravel, MD Inpatient   01/17/2017 2151 01/20/2017 1902 Full Code [id number]  Etta Quill, DO ED      Home/SNF/Other Home  Chief Complaint lower back pain  Level of Care/Admitting Diagnosis ED Disposition    ED Disposition Condition [place]: [facility] [[id number]]  Level of Care: Med-Surg [16]  Diagnosis: Delirium [935701]  Admitting Physician: Colbert Ewing [[id number]]  Attending Physician: Colbert Ewing [[id number]]  PT Class (Do Not Modify): Observation [104]  PT Acc Code (Do Not Modify): Observation [10022]       Medical History Past Medical History:  Diagnosis Date  . Alcohol abuse    /notes 01/18/2017  . Alcohol related seizure ([place])  01/17/2017   Archie Endo 01/17/2017  . Anxiety   . Arthritis   . Cirrhosis, alcoholic ([place]) 09/6224  . COPD (chronic obstructive pulmonary disease) ([place])   . Depression   . Emphysema lung ([place])   . Frequent headaches   . GERD (gastroesophageal reflux disease)   . Hay fever   . Hepatitis B   . IBS (irritable bowel syndrome)   . Migraine    frequency "depends on the weather" (01/19/2017)  . Panic attacks   . Pneumonia   . Portal hypertensive gastropathy ([place])   . Seizure ([place]) 05/20/2017 X 2  . Stroke [facility])     Allergies No Known Allergies  IV Location/Drains/Wounds Patient Lines/Drains/Airways Status   Active Line/Drains/Airways    Name:   Placement date:   Placement time:   Site:   Days:   Peripheral IV 01/01/18 Right Hand   01/01/18    1630    Hand   less than 1          Labs/Imaging Results for orders placed or performed during the hospital encounter of 01/01/18 (from the past 48 hour(s))  Comprehensive metabolic panel     Status: Abnormal   Collection Time: 01/01/18  3:09 PM  Result Value Ref Range   Sodium 142 135 - 145 mmol/L   Potassium 4.0 3.5 - 5.1 mmol/L   Chloride 106 98 - 111 mmol/L  CO2 25 22 - 32 mmol/L   Glucose, Bld 81 70 - 99 mg/dL   BUN 14 6 - 20 mg/dL   Creatinine, Ser 0.93 0.61 - 1.24 mg/dL   Calcium 10.1 8.9 - 10.3 mg/dL   Total Protein 7.1 6.5 - 8.1 g/dL   Albumin 3.3 (L) 3.5 - 5.0 g/dL   AST 59 (H) 15 - 41 U/L   ALT 21 0 - 44 U/L   Alkaline Phosphatase 151 (H) 38 - 126 U/L   Total Bilirubin 2.4 (H) 0.3 - 1.2 mg/dL   GFR calc non Af Amer >60 >60 mL/min   GFR calc Af Amer >60 >60 mL/min    Comment: (NOTE) The eGFR has been calculated using the CKD EPI equation. This calculation has not been validated in all clinical situations. eGFR's persistently <60 mL/min signify possible Chronic Kidney Disease.    Anion gap 11 5 - 15    Comment: Performed at [facility], [place] [address] [place], [place] [address]  Ethanol      Status: None   Collection Time: 01/01/18  3:09 PM  Result Value Ref Range   Alcohol, Ethyl (B) <10 <10 mg/dL    Comment: (NOTE) Lowest detectable limit for serum alcohol is 10 mg/dL. For medical purposes only. Performed at [facility], [place] [address], [place], [place] [address]   Urine rapid drug screen (hosp performed)     Status: None   Collection Time: 01/01/18  3:09 PM  Result Value Ref Range   Opiates NONE DETECTED NONE DETECTED   Cocaine NONE DETECTED NONE DETECTED   Benzodiazepines NONE DETECTED NONE DETECTED   Amphetamines NONE DETECTED NONE DETECTED   Tetrahydrocannabinol NONE DETECTED NONE DETECTED   Barbiturates NONE DETECTED NONE DETECTED    Comment: (NOTE) DRUG SCREEN FOR MEDICAL PURPOSES ONLY.  IF CONFIRMATION IS NEEDED FOR ANY PURPOSE, NOTIFY LAB WITHIN 5 DAYS. LOWEST DETECTABLE LIMITS FOR URINE DRUG SCREEN Drug Class                     Cutoff (ng/mL) Amphetamine and metabolites    1000 Barbiturate and metabolites    200 Benzodiazepine                 921 Tricyclics and metabolites     300 Opiates and metabolites        300 Cocaine and metabolites        300 THC                            50 Performed at [facility], [place] [address], [place], [place] [address]   CBC with Diff     Status: Abnormal   Collection Time: 01/01/18  3:09 PM  Result Value Ref Range   WBC 7.4 4.0 - 10.5 K/uL   RBC 4.89 4.22 - 5.81 MIL/uL   Hemoglobin 16.5 13.0 - 17.0 g/dL   HCT 48.6 39.0 - 52.0 %   MCV 99.4 78.0 - 100.0 fL   MCH 33.7 26.0 - 34.0 pg   MCHC 34.0 30.0 - 36.0 g/dL   RDW 15.5 11.5 - 15.5 %   Platelets 121 (L) 150 - 400 K/uL   Neutrophils Relative % 63 %   Neutro Abs 4.6 1.7 - 7.7 K/uL   Lymphocytes Relative 24 %   Lymphs Abs 1.8 0.7 - 4.0 K/uL   Monocytes Relative 9 %   Monocytes  Absolute 0.7 0.1 - 1.0 K/uL   Eosinophils Relative 3 %   Eosinophils Absolute 0.2 0.0 - 0.7 K/uL   Basophils Relative 1 %   Basophils Absolute  0.1 0.0 - 0.1 K/uL    Comment: Performed at [facility], [place] [address], [place], [place] [address]  Salicylate level     Status: None   Collection Time: 01/01/18  3:09 PM  Result Value Ref Range   Salicylate Lvl <3.4 2.8 - 30.0 mg/dL    Comment: Performed at [facility], [place] [address] [place], [place] [address]  Acetaminophen level     Status: Abnormal   Collection Time: 01/01/18  3:09 PM  Result Value Ref Range   Acetaminophen (Tylenol), Serum <10 (L) 10 - 30 ug/mL    Comment: (NOTE) Therapeutic concentrations vary significantly. A range of 10-30 ug/mL  may be an effective concentration for many patients. However, some  are best treated at concentrations outside of this range. Acetaminophen concentrations >150 ug/mL at 4 hours after ingestion  and >50 ug/mL at 12 hours after ingestion are often associated with  toxic reactions. Performed at [facility], [place] [address] [place], [place] [address]   Urinalysis, Routine w reflex microscopic     Status: Abnormal   Collection Time: 01/01/18  3:09 PM  Result Value Ref Range   Color, Urine AMBER (A) YELLOW    Comment: BIOCHEMICALS MAY BE AFFECTED BY COLOR   APPearance CLEAR CLEAR   Specific Gravity, Urine 1.021 1.005 - 1.030   pH 5.0 5.0 - 8.0   Glucose, UA NEGATIVE NEGATIVE mg/dL   Hgb urine dipstick NEGATIVE NEGATIVE   Bilirubin Urine NEGATIVE NEGATIVE   Ketones, ur NEGATIVE NEGATIVE mg/dL   Protein, ur NEGATIVE NEGATIVE mg/dL   Nitrite NEGATIVE NEGATIVE   Leukocytes, UA NEGATIVE NEGATIVE    Comment: Performed at [facility], [place] [address], [place], [place] [address]  Ammonia     Status: Abnormal   Collection Time: 01/01/18  3:09 PM  Result Value Ref Range   Ammonia 41 (H) 9 - 35 umol/L    Comment: Performed at [facility], [place] [address], [place], [place] [address]  Protime-INR     Status: Abnormal   Collection Time:  01/01/18  3:09 PM  Result Value Ref Range   Prothrombin Time 17.1 (H) 11.4 - 15.2 seconds   INR 1.40     Comment: Performed at [facility], [place] [address], [place], [place] [address]  Troponin I     Status: None   Collection Time: 01/01/18  3:09 PM  Result Value Ref Range   Troponin I <0.03 <0.03 ng/mL    Comment: Performed at [facility], [place] [address] [place], [place] [address]  Blood gas, arterial ([facility] & [facility] ONLY)     Status: Abnormal   Collection Time: 01/01/18  6:05 PM  Result Value Ref Range   FIO2 21.00    Delivery systems ROOM AIR    pH, Arterial 7.471 (H) 7.350 - 7.450   pCO2 arterial 36.8 32.0 - 48.0 mmHg   pO2, Arterial 61.3 (L) 83.0 - 108.0 mmHg   Bicarbonate 26.5 20.0 - 28.0 mmol/L   Acid-Base Excess 3.5 (H) 0.0 - 2.0 mmol/L   O2 Saturation 91.7 %   Patient temperature 98.6    Collection site RIGHT RADIAL    Drawn by [id number]    Sample type ARTERIAL DRAW  Allens test (pass/fail) PASS PASS    Comment: Performed at [facility], [place] [address] [place], [place] [address]   Dg Chest 2 View  Result Date: 01/01/2018 CLINICAL DATA:  Low back pain and shortness of breath for 2 months. EXAM: CHEST - 2 VIEW COMPARISON:  PA and lateral chest 06/15/2017, 05/20/2017 and 01/17/2017. FINDINGS: Hazy bilateral pulmonary opacities are identified. No pneumothorax or pleural effusion. Heart size is enlarged. No acute or focal bony abnormality. IMPRESSION: Hazy bilateral pulmonary opacities have an appearance most consistent with pulmonary edema. Electronically Signed   By: Inge Rise M.D.   On: 01/01/2018 16:33   Dg Lumbar Spine Complete  Result Date: 01/01/2018 CLINICAL DATA:  Low back pain EXAM: LUMBAR SPINE - COMPLETE 4+ VIEW COMPARISON:  07/31/2008 FINDINGS: Degenerative spurring anteriorly. Slight disc space narrowing at L5-S1. Moderate compression deformity at T12, age indeterminate but new since 2010. No subluxation.  SI joints are symmetric and unremarkable. IMPRESSION: Moderate compression deformity at T12, age indeterminate but new since 2010. Electronically Signed   By: Rolm Baptise M.D.   On: 01/01/2018 15:40   Ct Head Wo Contrast  Result Date: 01/01/2018 CLINICAL DATA:  Altered mental status EXAM: CT HEAD WITHOUT CONTRAST TECHNIQUE: Contiguous axial images were obtained from the base of the skull through the vertex without intravenous contrast. COMPARISON:  07/17/2017 FINDINGS: Brain: No acute intracranial abnormality. Specifically, no hemorrhage, hydrocephalus, mass lesion, acute infarction, or significant intracranial injury. Vascular: No hyperdense vessel or unexpected calcification. Skull: No acute calvarial abnormality. Sinuses/Orbits: Visualized paranasal sinuses and mastoids clear. Orbital soft tissues unremarkable. Other: None IMPRESSION: No acute intracranial abnormality. Electronically Signed   By: Rolm Baptise M.D.   On: 01/01/2018 15:41   Ct Chest W Contrast  Result Date: 01/01/2018 CLINICAL DATA:  Dyspnea and difficulty swallowing. EXAM: CT CHEST WITH CONTRAST TECHNIQUE: Multidetector CT imaging of the chest was performed during intravenous contrast administration. CONTRAST:  49m OMNIPAQUE IOHEXOL 300 MG/ML  SOLN COMPARISON:  12/24/2017 neck CT FINDINGS: Cardiovascular: Atherosclerosis at the origins of the great vessels. Nonaneurysmal thoracic aorta with atherosclerosis. Moderate degree of three-vessel coronary arteriosclerosis. Heart size is top normal without pericardial effusion. No large central pulmonary embolus is identified though the study is not tailored toward assessment of pulmonary emboli. Mediastinum/Nodes: Extensive mediastinal and bilateral hilar lymphadenopathy with representative lymph nodes as follows: 1. Right paratracheal 2.4 cm short axis lymph node, series 2/48 2. Left paratracheal 2.1 cm short axis lymph node, series 2/62 3. AP window lymph node measuring 2.3 cm short axis,  series 2/58 4. Right tracheobronchial 2.5 cm short axis lymph node, series 2/68 5. Left hilar lymph node measuring 2.7 cm short axis, series 2/77 6. Subcarinal 2.1 cm short axis lymph node, series 2/75 Other additional smaller prevascular and left lower paratracheal lymph nodes are present. Lungs/Pleura: Too numerous to count pulmonary nodules some of which demonstrate cavitation are identified scattered throughout both lungs. The dominant nodule is seen in the posterior basal segment of the right lower lobe measuring 1.5 x 1.6 x 1.6 cm. Subpleural wedge-shaped consolidation in the lingula likely reflects an area of atelectatic lung. No pleural effusion or pneumothorax. Upper Abdomen: Morphologic changes of cirrhosis with surface nodularity of the liver noted. No definite adrenal mass. Small epigastric nodules may reflect adenopathy. Splenules are present about the spleen. Musculoskeletal: No aggressive osseous lesions. Chronic degenerative change along the thoracic and upper lumbar spine with chronic compression deformity of T12. ACDF of the included lower cervical spine. IMPRESSION:  1. Multiple, too numerous to count pulmonary nodules some which are slightly cavitary in appearance, the dominant nodule in the right lower lobe measuring 1.5 x 1.6 x 1.6 cm. Referral to pulmonology or cardiothoracic surgery is suggested. 2. Mediastinal, bilateral hilar and epigastric lymphadenopathy concerning for metastasis. 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 3. Chronic degenerative change of the midthoracic spine with chronic moderate compression deformity of T12. 4. Morphologic changes of cirrhosis. Aortic Atherosclerosis (ICD10-I70.0). Electronically Signed   By: Ashley Royalty M.D.   On: 01/01/2018 19:12    Pending Labs Unresulted Labs (From admission, onward)    Start     Ordered   01/08/18 0500  Creatinine, serum  (enoxaparin (LOVENOX)    CrCl >/= 30  ml/min)  Weekly,   R    Comments:  while on enoxaparin therapy    01/01/18 1851   01/01/18 1848  Vitamin B12  Add-on,   R     01/01/18 1851   01/01/18 1848  TSH  Add-on,   R     01/01/18 1851   01/01/18 1844  Creatinine, serum  (enoxaparin (LOVENOX)    CrCl >/= 30 ml/min)  Once,   R    Comments:  Baseline for enoxaparin therapy IF NOT ALREADY DRAWN.    01/01/18 1851   01/01/18 1821  Brain natriuretic peptide  Add-on,   R     01/01/18 1820          Vitals/Pain Today's Vitals   01/01/18 1725 01/01/18 1815 01/01/18 1816 01/01/18 1900  BP: (!) 147/97 (!) 160/95  (!) 174/106  Pulse: 79  73 78  Resp: (!) 24     Temp:      TempSrc:      SpO2: 92%  96% 94%  PainSc:        Isolation Precautions No active isolations  Medications Medications  LORazepam (ATIVAN) injection 0-4 mg (0 mg Intravenous Not Given 01/01/18 1515)    Or  LORazepam (ATIVAN) tablet 0-4 mg ( Oral See Alternative 01/01/18 1515)  LORazepam (ATIVAN) injection 0-4 mg (has no administration in time range)    Or  LORazepam (ATIVAN) tablet 0-4 mg (has no administration in time range)  thiamine (VITAMIN B-1) tablet 100 mg (has no administration in time range)    Or  thiamine (B-1) injection 100 mg (has no administration in time range)  folic acid (FOLVITE) tablet 1 mg (has no administration in time range)  nadolol (CORGARD) tablet 20 mg (has no administration in time range)  nortriptyline (PAMELOR) capsule 150 mg (has no administration in time range)  Tiotropium Bromide Monohydrate AERS 2 puff (has no administration in time range)  enoxaparin (LOVENOX) injection 40 mg (has no administration in time range)  predniSONE (DELTASONE) tablet 40 mg (has no administration in time range)  azithromycin (ZITHROMAX) tablet 250 mg (has no administration in time range)  pantoprazole (PROTONIX) EC tablet 40 mg (has no administration in time range)  ipratropium-albuterol (DUONEB) 0.5-2.5 (3) MG/3ML nebulizer solution 3 mL (has no  administration in time range)  lactulose (CHRONULAC) 10 GM/15ML solution 20 g (has no administration in time range)  albuterol (PROVENTIL) (2.5 MG/3ML) 0.083% nebulizer solution 5 mg (5 mg Nebulization Given 01/01/18 1633)  ipratropium (ATROVENT) nebulizer solution 0.5 mg (0.5 mg Nebulization Given 01/01/18 1634)  furosemide (LASIX) injection 40 mg (40 mg Intravenous Given 01/01/18 1726)  methylPREDNISolone sodium succinate (SOLU-MEDROL) 125 mg/2 mL injection 125 mg (125 mg Intravenous Given  01/01/18 1851)  albuterol (PROVENTIL) (2.5 MG/3ML) 0.083% nebulizer solution 5 mg (5 mg Nebulization Given 01/01/18 1816)  ipratropium (ATROVENT) nebulizer solution 0.5 mg (0.5 mg Nebulization Given 01/01/18 1816)  iohexol (OMNIPAQUE) 300 MG/ML solution 75 mL (75 mLs Intravenous Contrast Given 01/01/18 1839)    Mobility walks

## 2018-01-01 NOTE — ED Notes (Signed)
Called to give report to floor, while attempting to give report, fire alarm went off and floor nurse had to go.

## 2018-01-01 NOTE — Progress Notes (Signed)
Entered room to give [facility] tx to patient.  He stated that he just had a treatment in the ED and does not want one at this time.  Will be glad to start in AM with HHN treatments.  RN present for conversation.

## 2018-01-01 NOTE — ED Provider Notes (Signed)
[facility] DEPT Provider Note   CSN: [id number] Arrival date & time: 01/01/18  1318     History   Chief Complaint No chief complaint on file.   HPI Michael Mcfarland is a 52 y.o. male.  HPI  Patient is a 52 year old male with a history of alcoholism, alcoholic cirrhosis, esophageal varices, new diagnosis of COPD, IBS, and in the midst of cancer workup resenting for change in mental status, memory difficulty, shortness of breath, back pain, and confusion.  Patient presents with his sister who brought him to the hospital.  Patient sister reports that patient was recently evaluated by psychologist per Medicare, who recommended that he be medically evaluated and possibly psychiatrically admitted.  Patient sister reports several weeks of confusion, and abnormal behavior by her brother.  She reports that he typically drinks 8 bottles of [employer] wine coolers per day, and this is not new, but his abnormal behavior is new.  She reports that he will be confused as to the time and year.  She also reports that a couple nights ago, he began walking around the house in the nude, which is abnormal behavior for him.  Patient is denying any auditory or visual hallucinations, SI, or AVH.  He is reporting 1-month history of a "knot" swelling on his left supraclavicular region, becoming more short of breath with exertion.  It is not worse today.  He is in the midst of a lung cancer work-up by his primary care provider, who found metastatic-appearing nodes on a CT neck.  Of note, patient has history of alcoholism, SDH in April 2019, seizures while withdrawing from alcohol, but no history of primary psychiatric conditions.  He is not on any psychiatric medication.  Past Medical History:  Diagnosis Date  . Alcohol abuse    /notes 01/18/2017  . Alcohol related seizure ([place]) 01/17/2017   Archie Endo 01/17/2017  . Anxiety   . Arthritis   . Cirrhosis, alcoholic ([place]) 03/270  .  COPD (chronic obstructive pulmonary disease) ([place])   . Depression   . Emphysema lung ([place])   . Frequent headaches   . GERD (gastroesophageal reflux disease)   . Hay fever   . Hepatitis B   . IBS (irritable bowel syndrome)   . Migraine    frequency "depends on the weather" (01/19/2017)  . Panic attacks   . Pneumonia   . Portal hypertensive gastropathy ([place])   . Seizure ([place]) 05/20/2017 X 2  . Stroke [facility])     Patient Active Problem List   Diagnosis Date Noted  . Pulmonary nodules 12/28/2017  . Pulmonary emphysema ([place]) 12/28/2017  . Aortic atherosclerosis ([place]) 12/28/2017  . Candidal intertrigo 11/12/2017  . Nicotine dependence with current use 09/17/2017  . Contraindication to anticoagulation therapy 09/17/2017  . Eustachian tube dysfunction 08/30/2017  . Protein-calorie malnutrition ([place]) 07/29/2017  . Alcohol withdrawal syndrome with perceptual disturbance ([place]) 07/18/2017  . Benzodiazepine abuse, episodic ([place]) 07/18/2017  . SDH (subdural hematoma) ([place]) 07/16/2017  . Insomnia 07/08/2017  . Low back pain 07/08/2017  . Esophageal varices in alcoholic cirrhosis ([place])   . Hepatic encephalopathy ([place]) 06/15/2017  . Anxiety 06/03/2017  . Seizure ([place])   . Cirrhosis ([place])   . Thrombocytopenia ([place])   . Alcohol withdrawal seizure ([place]) 01/17/2017    Past Surgical History:  Procedure Laterality Date  . ANTERIOR CERVICAL DECOMP/DISCECTOMY FUSION    . ESOPHAGOGASTRODUODENOSCOPY N/A 06/18/2017   Procedure: ESOPHAGOGASTRODUODENOSCOPY (EGD);  Surgeon: Doran Stabler, MD;  Location: [facility];  Service: Gastroenterology;  Laterality: N/A;  . LUMBAR [place] Medications    Prior to Admission medications   Medication Sig Start Date End Date Taking? Authorizing Provider  albuterol (PROVENTIL HFA;VENTOLIN HFA) 108 (90 Base) MCG/ACT inhaler Inhale 2 puffs into the lungs every 6 (six) hours as needed for wheezing or shortness of breath. 12/28/17   [name], MD  baclofen (LIORESAL) 10 MG tablet Take 1 tablet (10 mg total) by mouth 3 (three) times daily. 11/12/17   [name], MD  diclofenac (VOLTAREN) 50 MG EC tablet Take 1 tablet (50 mg total) by mouth 2 (two) times daily. 11/12/17   [name], MD  feeding supplement, ENSURE ENLIVE, (ENSURE ENLIVE) LIQD Take 237 mLs by mouth 2 (two) times daily between meals. 06/18/17   Kayleen Memos, DO  folic acid (FOLVITE) 1 MG tablet Take 1 tablet (1 mg total) by mouth daily. 07/24/17   Roxan Hockey, MD  ipratropium (ATROVENT) 0.06 % nasal spray Place 2 sprays into both nostrils 4 (four) times daily. 11/12/17   [name], MD  lactulose [facility]) 10 GM/15ML solution Take 93mL one to three times daily to titrate for 2-3 soft stools per day 11/12/17   [name], MD  Multiple Vitamin (MULTIVITAMIN WITH MINERALS) TABS tablet Take 1 tablet by mouth daily. 07/25/17   Roxan Hockey, MD  nadolol (CORGARD) 20 MG tablet Take 1 tablet (20 mg total) by mouth daily. 11/12/17   [name], MD  nortriptyline (PAMELOR) 75 MG capsule Take 2 capsules (150 mg total) by mouth at bedtime. 12/28/17   [name], MD  nystatin (MYCOSTATIN/NYSTOP) powder Apply topically 4 (four) times daily. 11/12/17   [name], MD  pantoprazole (PROTONIX) 40 MG tablet Take 1 tablet (40 mg total) by mouth daily at 12 noon. 07/08/17   [name], MD  Tiotropium Bromide Monohydrate (SPIRIVA RESPIMAT) 2.5 MCG/ACT AERS Inhale 2 puffs into the lungs daily. 12/28/17   [name], MD    Family History Family History  Problem Relation Age of Onset  . Diabetes Mother   . Depression Mother   . Hypertension Mother   . Arthritis Mother   . Arthritis Father   . Heart disease Father   . Liver disease Sister        fatty liver  . Heart disease Brother   . Heart attack Paternal Grandfather   . Diabetes Sister     Social History Social History   Tobacco Use  . Smoking status: Current Some Day Smoker      Packs/day: 0.25    Years: 35.00    Pack years: 8.75    Types: Cigarettes  . Smokeless tobacco: Never Used  . Tobacco comment: quit smoking after admission 04/2017  Substance Use Topics  . Alcohol use: Not Currently    Alcohol/week: 18.0 standard drinks    Types: 18 Cans of beer per week    Comment: until admission 04/2017 drinking 12 to 18 beers daily.  since then, no beer or other ETOH (as of 06/17/17)  . Drug use: Not Currently    Comment: 05/20/2017 "been a long time; I tried different things when I was younger"     Allergies   Patient has no known allergies.   Review of Systems Review of Systems  Constitutional: Negative for chills and fever.  HENT: Negative for congestion, rhinorrhea,  sinus pain and sore throat.   Eyes: Negative for visual disturbance.  Respiratory: Positive for cough and shortness of breath. Negative for chest tightness.   Cardiovascular: Negative for chest pain, palpitations and leg swelling.  Gastrointestinal: Negative for abdominal pain, nausea and vomiting.  Genitourinary: Negative for dysuria and flank pain.  Musculoskeletal: Negative for back pain and myalgias.  Skin: Negative for rash.  Neurological: Positive for weakness. Negative for dizziness, syncope, light-headedness and headaches.  Psychiatric/Behavioral: Positive for confusion and decreased concentration. Negative for hallucinations, self-injury and suicidal ideas.     Physical Exam Updated Vital Signs BP (!) 161/101 (BP Location: Right Arm) Comment: RN notified   Pulse 71   Temp 97.6 F (36.4 C) (Oral)   Resp 20   SpO2 96%   Physical Exam  Constitutional: He appears well-developed and well-nourished. No distress.  HENT:  Head: Normocephalic and atraumatic.  Mouth/Throat: Oropharynx is clear and moist.  Eyes: Pupils are equal, round, and reactive to light. Conjunctivae and EOM are normal.  No disconjugate gaze.  Neck: Normal range of motion. Neck supple.  Large, firm and  immobile left supraclavicular node.   Cardiovascular: Normal rate, regular rhythm, S1 normal and S2 normal.  No murmur heard. Pulmonary/Chest: Effort normal. He has wheezes. He has rales.  Wheezes and rales present, greater on right than left.   Abdominal: Soft. He exhibits no distension. There is no tenderness. There is no guarding.  Musculoskeletal: Normal range of motion. He exhibits no edema or deformity.  Lymphadenopathy:    He has cervical adenopathy.  Neurological: He is alert.  Mental Status:  Alert, oriented, thought content appropriate, but not able to give a coherent history. Speech fluent without evidence of aphasia when speaking but does have raspy voice. Able to follow 2 step commands without difficulty.  EOMI, PERRL.  Motor:  Normal tone. 5/5 in upper and lower extremities bilaterally including strong and equal grip strength and dorsiflexion/plantar flexion Cerebellar: normal finger-to-nose with bilateral upper extremities Gait: normal gait and balance CV: distal pulses palpable throughout    Skin: Skin is warm and dry. No rash noted. No erythema.  Psychiatric:  Blunted affect.  Nursing note and vitals reviewed.    ED Treatments / Results  Labs (all labs ordered are listed, but only abnormal results are displayed) Labs Reviewed  COMPREHENSIVE METABOLIC PANEL  ETHANOL  RAPID URINE DRUG SCREEN, HOSP PERFORMED  CBC WITH DIFFERENTIAL/PLATELET  SALICYLATE LEVEL  ACETAMINOPHEN LEVEL  URINALYSIS, ROUTINE W REFLEX MICROSCOPIC  RAPID URINE DRUG SCREEN, HOSP PERFORMED  AMMONIA  PROTIME-INR  TROPONIN I    EKG EKG Interpretation  Date/Time:  Saturday January 01 2018 16:27:12 EDT Ventricular Rate:  73 PR Interval:  198 QRS Duration: 112 QT Interval:  402 QTC Calculation: 442 R Axis:   -27 Text Interpretation:  Normal sinus rhythm Possible Left atrial enlargement Septal infarct , age undetermined Abnormal ECG No acute changes No significant change since last  tracing Confirmed by Varney Biles [phone]) on 01/01/2018 5:35:52 PM   Radiology Dg Chest 2 View  Result Date: 01/01/2018 CLINICAL DATA:  Low back pain and shortness of breath for 2 months. EXAM: CHEST - 2 VIEW COMPARISON:  PA and lateral chest 06/15/2017, 05/20/2017 and 01/17/2017. FINDINGS: Hazy bilateral pulmonary opacities are identified. No pneumothorax or pleural effusion. Heart size is enlarged. No acute or focal bony abnormality. IMPRESSION: Hazy bilateral pulmonary opacities have an appearance most consistent with pulmonary edema. Electronically Signed   By: Inge Rise M.D.  On: 01/01/2018 16:33   Dg Lumbar Spine Complete  Result Date: 01/01/2018 CLINICAL DATA:  Low back pain EXAM: LUMBAR SPINE - COMPLETE 4+ VIEW COMPARISON:  07/31/2008 FINDINGS: Degenerative spurring anteriorly. Slight disc space narrowing at L5-S1. Moderate compression deformity at T12, age indeterminate but new since 2010. No subluxation. SI joints are symmetric and unremarkable. IMPRESSION: Moderate compression deformity at T12, age indeterminate but new since 2010. Electronically Signed   By: Rolm Baptise M.D.   On: 01/01/2018 15:40   Ct Head Wo Contrast  Result Date: 01/01/2018 CLINICAL DATA:  Altered mental status EXAM: CT HEAD WITHOUT CONTRAST TECHNIQUE: Contiguous axial images were obtained from the base of the skull through the vertex without intravenous contrast. COMPARISON:  07/17/2017 FINDINGS: Brain: No acute intracranial abnormality. Specifically, no hemorrhage, hydrocephalus, mass lesion, acute infarction, or significant intracranial injury. Vascular: No hyperdense vessel or unexpected calcification. Skull: No acute calvarial abnormality. Sinuses/Orbits: Visualized paranasal sinuses and mastoids clear. Orbital soft tissues unremarkable. Other: None IMPRESSION: No acute intracranial abnormality. Electronically Signed   By: Rolm Baptise M.D.   On: 01/01/2018 15:41    Procedures Procedures (including  critical care time)  Medications Ordered in ED Medications  LORazepam (ATIVAN) injection 0-4 mg (has no administration in time range)    Or  LORazepam (ATIVAN) tablet 0-4 mg (has no administration in time range)  LORazepam (ATIVAN) injection 0-4 mg (has no administration in time range)    Or  LORazepam (ATIVAN) tablet 0-4 mg (has no administration in time range)  thiamine (VITAMIN B-1) tablet 100 mg (has no administration in time range)    Or  thiamine (B-1) injection 100 mg (has no administration in time range)     Initial Impression / Assessment and Plan / ED Course  I have reviewed the triage vital signs and the nursing notes.  Pertinent labs & imaging results that were available during my care of the patient were reviewed by me and considered in my medical decision making (see chart for details).  Clinical Course as of Jan 01 1822  Sat Jan 01, 2018  1705 Alkaline Phosphatase(!): 151 [AM]  1705 AST(!): 59 [AM]  1705 Albumin(!): 3.3 [AM]  9381 Slightly elevated. May be contributory to symptoms.   Ammonia(!): 41 [AM]  0175 Age indeterminate compression fracture.  DG Lumbar Spine Complete [AM]  1025 Patient desaturated while ambulating to the mid to upper 80s, and remained to 88% at rest for 2 minutes per Rn.  Patient also tachypneic during this interval.   [AM]  1742 Pulmonary edema noted.   DG Chest 2 View [AM]  8527 Spoke with Dr. Hampton Abbot of [facility] who will admit patient. I appreciate her involvement in the care of this patient.    [AM]    Clinical Course User Index [AM] Albesa Seen, PA-C    Patient is nontoxic-appearing at rest, afebrile, and not hypoxic at rest.  Differential diagnosis includes Wernicke's encephalopathy, metabolic encephalopathy including hepatic, alcoholic, drug-induced, hypoxia-induced.   Will patient's shortness of breath may be related to his change in mental status if he is persistently hypoxic, do not suspect pulmonary embolism at this time given  time course of symptoms, lack of tachycardia.  Additionally, patient is in the midst of a lung cancer work-up, and may have underlying pulmonary disease.  Chest x-ray demonstrates pulmonary edema.  No obvious masses noted.  No obvious compression of SVC.  Lab work otherwise remarkable for alkaline phosphatase of 151, AST 59, albumin 3.3, and total bili of  2.4.  Platelets are low at 121, and PT is 17.1 seconds, pneumonia is 41.  This is all consistent with his chronic alcoholic cirrhosis.  Troponin negative.  EKG not acutely ischemic.  Patient does not have alcohol in the system. CT head without intracranial abnormality.  Do not suspect acute withdrawal at this time, and patient is not showing symptoms of Wernicke's encephalopathy.  Patient is given thiamine, and CIWA protocol as ordered given history.   At this time, feel that patient's metabolic encephalopathy may be largely influenced by hypoxia, which may be multifactorial from COPD exacerbation versus new diagnosis of heart failure versus underlying lung cancer.  Will seek admission.  Final Clinical Impressions(s) / ED Diagnoses   Final diagnoses:  Metabolic encephalopathy  Elevated blood pressure reading without diagnosis of hypertension  Shortness of breath  Acute pulmonary edema [facility])  COPD exacerbation [facility])    ED Discharge Orders    None       Albesa Seen, PA-C 01/01/18 [place], Ankit, MD 01/02/18 [phone]

## 2018-01-02 ENCOUNTER — Observation Stay (HOSPITAL_BASED_OUTPATIENT_CLINIC_OR_DEPARTMENT_OTHER): Payer: Self-pay

## 2018-01-02 ENCOUNTER — Observation Stay (HOSPITAL_COMMUNITY): Payer: Self-pay

## 2018-01-02 DIAGNOSIS — R0602 Shortness of breath: Secondary | ICD-10-CM

## 2018-01-02 DIAGNOSIS — F17200 Nicotine dependence, unspecified, uncomplicated: Secondary | ICD-10-CM

## 2018-01-02 DIAGNOSIS — I8510 Secondary esophageal varices without bleeding: Secondary | ICD-10-CM

## 2018-01-02 DIAGNOSIS — K7030 Alcoholic cirrhosis of liver without ascites: Secondary | ICD-10-CM

## 2018-01-02 LAB — CBC
HEMATOCRIT: 48.2 % (ref 39.0–52.0)
Hemoglobin: 16.7 g/dL (ref 13.0–17.0)
MCH: 34.1 pg — AB (ref 26.0–34.0)
MCHC: 34.6 g/dL (ref 30.0–36.0)
MCV: 98.4 fL (ref 78.0–100.0)
PLATELETS: 118 10*3/uL — AB (ref 150–400)
RBC: 4.9 MIL/uL (ref 4.22–5.81)
RDW: 15.4 % (ref 11.5–15.5)
WBC: 6.8 10*3/uL (ref 4.0–10.5)

## 2018-01-02 LAB — BASIC METABOLIC PANEL
Anion gap: 12 (ref 5–15)
BUN: 16 mg/dL (ref 6–20)
CHLORIDE: 103 mmol/L (ref 98–111)
CO2: 26 mmol/L (ref 22–32)
CREATININE: 0.96 mg/dL (ref 0.61–1.24)
Calcium: 10.5 mg/dL — ABNORMAL HIGH (ref 8.9–10.3)
GFR calc Af Amer: >60 mL/min (ref >60)
GFR calc non Af Amer: >60 mL/min (ref >60)
Glucose, Bld: 165 mg/dL — ABNORMAL HIGH (ref 70–99)
Potassium: 3.8 mmol/L (ref 3.5–5.1)
Sodium: 141 mmol/L (ref 135–145)

## 2018-01-02 LAB — TSH: TSH: 0.494 u[IU]/mL (ref 0.350–4.500)

## 2018-01-02 LAB — ECHOCARDIOGRAM COMPLETE
Height: 70 in
WEIGHTICAEL: 4246.94 [oz_av]

## 2018-01-02 LAB — VITAMIN B12: Vitamin B-12: 877 pg/mL (ref 180–914)

## 2018-01-02 LAB — MAGNESIUM: Magnesium: 1.9 mg/dL (ref 1.7–2.4)

## 2018-01-02 MED ORDER — ORAL CARE MOUTH RINSE
15.0000 mL | Freq: Two times a day (BID) | OROMUCOSAL | Status: DC
  Administered 2018-01-02 – 2018-01-03 (×2): 15 mL via OROMUCOSAL

## 2018-01-02 MED ORDER — CHLORHEXIDINE GLUCONATE 0.12 % MT SOLN
15.0000 mL | Freq: Two times a day (BID) | OROMUCOSAL | Status: DC
  Administered 2018-01-02 – 2018-01-03 (×2): 15 mL via OROMUCOSAL
  Filled 2018-01-02 (×3): qty 15

## 2018-01-02 MED ORDER — ACETAMINOPHEN 325 MG PO TABS: 650.0000 mg | ORAL_TABLET | Freq: Two times a day (BID) | ORAL | Status: DC | PRN

## 2018-01-02 MED ORDER — TRAMADOL HCL 50 MG PO TABS
50.0000 mg | ORAL_TABLET | Freq: Two times a day (BID) | ORAL | Status: DC | PRN
  Administered 2018-01-02 – 2018-01-03 (×2): 50 mg via ORAL
  Filled 2018-01-02 (×2): qty 1

## 2018-01-02 NOTE — Evaluation (Signed)
Clinical/Bedside Swallow Evaluation Patient Details  Name: Michael Mcfarland MRN: [id number] Date of Birth: 1965/08/26  Today's Date: 01/02/2018 Time: SLP Start Time (ACUTE ONLY): 1150 SLP Stop Time (ACUTE ONLY): 1210 SLP Time Calculation (min) (ACUTE ONLY): 20 min  Past Medical History:  Past Medical History:  Diagnosis Date  . Alcohol abuse    /notes 01/18/2017  . Alcohol related seizure ([place]) 01/17/2017   Archie Endo 01/17/2017  . Anxiety   . Arthritis   . Cirrhosis, alcoholic ([place]) 35/4656  . COPD (chronic obstructive pulmonary disease) ([place])   . Depression   . Emphysema lung ([place])   . Frequent headaches   . GERD (gastroesophageal reflux disease)   . Hay fever   . Hepatitis B   . IBS (irritable bowel syndrome)   . Migraine    frequency "depends on the weather" (01/19/2017)  . Panic attacks   . Pneumonia   . Portal hypertensive gastropathy ([place])   . Seizure ([place]) 05/20/2017 X 2  . Stroke [facility])    Past Surgical History:  Past Surgical History:  Procedure Laterality Date  . ANTERIOR CERVICAL DECOMP/DISCECTOMY FUSION    . ESOPHAGOGASTRODUODENOSCOPY N/A 06/18/2017   Procedure: ESOPHAGOGASTRODUODENOSCOPY (EGD);  Surgeon: Doran Stabler, MD;  Location: [facility];  Service: Gastroenterology;  Laterality: N/A;  . LUMBAR DISC SURGERY     HPI:   [name] is a 52 y.o. male with hx of alcoholism with hx of withdrawal seizures, alcoholic cirrhosis, prior SDH, HTN, COPD and recently discovered cervical LAD and hoarseness x 2 months concerning for metastatic disease who presented with episodes of delirium. Per ED provider, his sister brought him to [facility] ED due to episodes of confusion and strange behavior for the past several weeks. Of note he has also had neck swelling, mild dysphagia, hoarsness x 2 months and a recent CT soft neck ordered by PCP showed extensive cervical LAD and L vocal paresis concerning for a primary lung malignancy. Outpatient CT chest was ordered but not yet  completed. Patient was calm and cooperative during my exam and does recall any particular event that has brought him to the hospital, although does report neck discomfort and increasing SOB. He has still been able to maintain PO intake.   Patient with CT of the soft tissues of the neck with the following findings:  Left greater than right cervical and mediastinal lymphadenopathy consistent with metastatic disease. Chest CT is recommended to evaluate for a primary lung malignancy.  2. Findings suggesting left vocal cord paresis.  3. Small right upper lobe lung nodules measuring up to 5 mm in size.  CT head is negative for acute findings.     Assessment / Plan / Recommendation Clinical Impression  Clinical swallowing evaluation was completed using thin liquids, pureed material and dry solids.  The patient endorses trouble swallowing but only with particulate type foods which feel like they do not want to go down.  He does not endorse any issues with liquids or with the feeling that material goes down the wrong way.  Oral mechanism exam was completed and remarkable for right sided lingual weakness and little to no vocal cord closure.  The patient's voice was essentially aphonic.  Per patient this has been an ongoing issue of at least a few months that has gradually gotten worse.  Given clinical exam there is concern for a pharyngeal dysphagia.  Laryngeal movement appeared diminished and the patient was noted to have multiple swallows across bolus textures.  Intermittent  coughing was seen given intake.  Given results of clinical exam, reason for admission and concern for involvement of the recurrent laryngeal nerve recommend MBS to assess current swallowing physiology.   SLP Visit Diagnosis: Dysphagia, unspecified (R13.10)    Aspiration Risk  Moderate aspiration risk    Diet Recommendation   Continue current diet pending MBS results       Other  Recommendations Recommended Consults: Consider ENT  evaluation(to assess vocal cord movement) Oral Care Recommendations: Oral care BID             Swallow Study   General Date of Onset: 01/01/18 HPI:  [name] is a 52 y.o. male with hx of alcoholism with hx of withdrawal seizures, alcoholic cirrhosis, prior SDH, HTN, COPD and recently discovered cervical LAD and hoarseness x 2 months concerning for metastatic disease who presented with episodes of delirium. Per ED provider, his sister brought him to [facility] ED due to episodes of confusion and strange behavior for the past several weeks. Of note he has also had neck swelling, mild dysphagia, hoarsness x 2 months and a recent CT soft neck ordered by PCP showed extensive cervical LAD and L vocal paresis concerning for a primary lung malignancy. Outpatient CT chest was ordered but not yet completed. Patient was calm and cooperative during my exam and does recall any particular event that has brought him to the hospital, although does report neck discomfort and increasing SOB. He has still been able to maintain PO intake.   Patient with CT of the soft tissues of the neck with the following findings:  Left greater than right cervical and mediastinal lymphadenopathy consistent with metastatic disease. Chest CT is recommended to evaluate for a primary lung malignancy.  2. Findings suggesting left vocal cord paresis.  3. Small right upper lobe lung nodules measuring up to 5 mm in size.  CT head is negative for acute findings.   Type of Study: Bedside Swallow Evaluation Previous Swallow Assessment: None noted at [facility]. Diet Prior to this Study: Regular;Thin liquids Temperature Spikes Noted: No Respiratory Status: Room air History of Recent Intubation: No Behavior/Cognition: Alert;Cooperative;Pleasant mood Oral Cavity Assessment: Within Functional Limits Oral Care Completed by SLP: No Vision: Functional for self-feeding Self-Feeding Abilities: Able to feed self Patient Positioning: Upright in bed Baseline  Vocal Quality: Aphonic Volitional Cough: Weak Volitional Swallow: Able to elicit    Oral/Motor/Sensory Function Overall Oral Motor/Sensory Function: Mild impairment Facial ROM: Within Functional Limits Facial Symmetry: Within Functional Limits Facial Strength: Within Functional Limits Lingual ROM: Within Functional Limits Lingual Symmetry: Within Functional Limits Lingual Strength: Reduced Mandible: Within Functional Limits   Ice Chips Ice chips: Not tested   Thin Liquid Thin Liquid: Impaired Presentation: Cup;Spoon;Straw;Self Fed Pharyngeal  Phase Impairments: Cough - Immediate;Cough - Delayed;Decreased hyoid-laryngeal movement    Nectar Thick Nectar Thick Liquid: Not tested   Honey Thick Honey Thick Liquid: Not tested   Puree Puree: Impaired Presentation: Spoon;Self Fed Pharyngeal Phase Impairments: Cough - Delayed;Decreased hyoid-laryngeal movement   Solid     Solid: Impaired Presentation: Self Fed Pharyngeal Phase Impairments: Decreased hyoid-laryngeal movement;Cough - Delayed     Shelly Flatten, MA, CCC-SLP Acute Rehab SLP [phone]  Lamar Sprinkles 01/02/2018,12:18 PM

## 2018-01-02 NOTE — Progress Notes (Signed)
PROGRESS NOTE    Michael Mcfarland  DTO:[id number] DOB: Apr 30, 1965 DOA: 01/01/2018 PCP: [name], MD   Brief Narrative: Michael Mcfarland is a 52 y.o. male with hx of alcoholism with hx of withdrawal seizures, alcoholic cirrhosis, prior SDH, HTN, COPD and recently discovered cervical LAD and hoarseness x 2 months concerning for metastatic disease. He presented secondary to delirium and shortness of breath concerning for mild COPD exacerbation vs heart failure. Improved.   Assessment & Plan:   Active Problems:   Delirium   COPD with acute exacerbation ([place])   Delirium In setting of alcohol abuse. Unclear etiology, however. History of hepatic encephalopathy. Not completely baseline. Ammonia low. UDS insignificant -Continue lactulose  COPD exacerbation Mild. -Continue prednisone and azithromycin  Shortness of breath Improved. Thought secondary to COPD, however, BNP was elevated. -Transthoracic Echocardiogram pending -Continue Lasix pending Transthoracic Echocardiogram  Neck adenopathy Multiple lung nodules -Pulmonology consult -IR consult for biopsy  Alcoholic cirrhosis Alcohol abuse History of withdrawal in the past. -CIWA with scheduled and prn ativan  Thrombocytopenia In setting of cirrhosis. Stable. No bleeding  Esophageal varices Non bleeding currently. -Continue nadolol  Elevated blood pressure ?Secondary to withdrawal symptoms. -Continue Nadolol  Tobacco abuse Currently declining nicotine patch. States he plans to quit.   DVT prophylaxis: Lovenox Code Status:   Code Status: Partial Code Family Communication: Sister Education officer, community) at bedside Disposition Plan: Discharge pending improvement of symptoms, pulmonology recommendations, IR management   Consultants:   Pulmonology  Interventional radiology  Procedures:   None  Antimicrobials:  Azithromycin    Subjective: Dyspnea improved. Mental status not at baseline per  sister.  Objective: Vitals:   01/02/18 0432 01/02/18 0915 01/02/18 0917 01/02/18 1416  BP: (!) 135/92   (!) 157/94  Pulse: 79   82  Resp: 16   16  Temp: 98.5 F (36.9 C)   98 F (36.7 C)  TempSrc:    Oral  SpO2: 93% 91% 91% 91%  Weight:      Height:        Intake/Output Summary (Last 24 hours) at 01/02/2018 1432 Last data filed at 01/02/2018 1417 Gross per 24 hour  Intake 240 ml  Output -  Net 240 ml   Filed Weights   01/01/18 2020  Weight: 120.4 kg    Examination:  General exam: Appears calm and comfortable Respiratory system: Bilateral rales at bases Respiratory effort normal. Cardiovascular system: S1 & S2 heard, RRR. No murmurs, rubs, gallops or clicks. Gastrointestinal system: Abdomen is nondistended, soft and nontender. Normal bowel sounds heard. Central nervous system: Alert and oriented to person and palce. No focal neurological deficits. Extremities: No edema. No calf tenderness Skin: No cyanosis. No rashes Psychiatry: Affect flat    Data Reviewed: I have personally reviewed following labs and imaging studies  CBC: Recent Labs  Lab 01/01/18 1509 01/02/18 0527  WBC 7.4 6.8  NEUTROABS 4.6  --   HGB 16.5 16.7  HCT 48.6 48.2  MCV 99.4 98.4  PLT 121* 983*   Basic Metabolic Panel: Recent Labs  Lab 01/01/18 1509 01/02/18 0527  NA 142 141  K 4.0 3.8  CL 106 103  CO2 25 26  GLUCOSE 81 165*  BUN 14 16  CREATININE 0.93 0.96  CALCIUM 10.1 10.5*  MG  --  1.9   GFR: Estimated Creatinine Clearance: 114.5 mL/min (by C-G formula based on SCr of 0.96 mg/dL). Liver Function Tests: Recent Labs  Lab 01/01/18 1509  AST 59*  ALT 21  ALKPHOS 151*  BILITOT 2.4*  PROT 7.1  ALBUMIN 3.3*   No results for input(s): LIPASE, AMYLASE in the last 168 hours. Recent Labs  Lab 01/01/18 1509  AMMONIA 41*   Coagulation Profile: Recent Labs  Lab 01/01/18 1509  INR 1.40   Cardiac Enzymes: Recent Labs  Lab 01/01/18 1509  TROPONINI <0.03   BNP (last  3 results) No results for input(s): PROBNP in the last 8760 hours. HbA1C: No results for input(s): HGBA1C in the last 72 hours. CBG: No results for input(s): GLUCAP in the last 168 hours. Lipid Profile: No results for input(s): CHOL, HDL, LDLCALC, TRIG, CHOLHDL, LDLDIRECT in the last 72 hours. Thyroid Function Tests: Recent Labs    01/02/18 0527  TSH 0.494   Anemia Panel: Recent Labs    01/02/18 0527  VITAMINB12 877   Sepsis Labs: No results for input(s): PROCALCITON, LATICACIDVEN in the last 168 hours.  No results found for this or any previous visit (from the past 240 hour(s)).       Radiology Studies: Dg Chest 2 View  Result Date: 01/01/2018 CLINICAL DATA:  Low back pain and shortness of breath for 2 months. EXAM: CHEST - 2 VIEW COMPARISON:  PA and lateral chest 06/15/2017, 05/20/2017 and 01/17/2017. FINDINGS: Hazy bilateral pulmonary opacities are identified. No pneumothorax or pleural effusion. Heart size is enlarged. No acute or focal bony abnormality. IMPRESSION: Hazy bilateral pulmonary opacities have an appearance most consistent with pulmonary edema. Electronically Signed   By: Inge Rise M.D.   On: 01/01/2018 16:33   Dg Lumbar Spine Complete  Result Date: 01/01/2018 CLINICAL DATA:  Low back pain EXAM: LUMBAR SPINE - COMPLETE 4+ VIEW COMPARISON:  07/31/2008 FINDINGS: Degenerative spurring anteriorly. Slight disc space narrowing at L5-S1. Moderate compression deformity at T12, age indeterminate but new since 2010. No subluxation. SI joints are symmetric and unremarkable. IMPRESSION: Moderate compression deformity at T12, age indeterminate but new since 2010. Electronically Signed   By: Rolm Baptise M.D.   On: 01/01/2018 15:40   Ct Head Wo Contrast  Result Date: 01/01/2018 CLINICAL DATA:  Altered mental status EXAM: CT HEAD WITHOUT CONTRAST TECHNIQUE: Contiguous axial images were obtained from the base of the skull through the vertex without intravenous contrast.  COMPARISON:  07/17/2017 FINDINGS: Brain: No acute intracranial abnormality. Specifically, no hemorrhage, hydrocephalus, mass lesion, acute infarction, or significant intracranial injury. Vascular: No hyperdense vessel or unexpected calcification. Skull: No acute calvarial abnormality. Sinuses/Orbits: Visualized paranasal sinuses and mastoids clear. Orbital soft tissues unremarkable. Other: None IMPRESSION: No acute intracranial abnormality. Electronically Signed   By: Rolm Baptise M.D.   On: 01/01/2018 15:41   Ct Chest W Contrast  Result Date: 01/01/2018 CLINICAL DATA:  Dyspnea and difficulty swallowing. EXAM: CT CHEST WITH CONTRAST TECHNIQUE: Multidetector CT imaging of the chest was performed during intravenous contrast administration. CONTRAST:  13mL OMNIPAQUE IOHEXOL 300 MG/ML  SOLN COMPARISON:  12/24/2017 neck CT FINDINGS: Cardiovascular: Atherosclerosis at the origins of the great vessels. Nonaneurysmal thoracic aorta with atherosclerosis. Moderate degree of three-vessel coronary arteriosclerosis. Heart size is top normal without pericardial effusion. No large central pulmonary embolus is identified though the study is not tailored toward assessment of pulmonary emboli. Mediastinum/Nodes: Extensive mediastinal and bilateral hilar lymphadenopathy with representative lymph nodes as follows: 1. Right paratracheal 2.4 cm short axis lymph node, series 2/48 2. Left paratracheal 2.1 cm short axis lymph node, series 2/62 3. AP window lymph node measuring 2.3 cm short axis, series 2/58 4. Right tracheobronchial 2.5 cm short  axis lymph node, series 2/68 5. Left hilar lymph node measuring 2.7 cm short axis, series 2/77 6. Subcarinal 2.1 cm short axis lymph node, series 2/75 Other additional smaller prevascular and left lower paratracheal lymph nodes are present. Lungs/Pleura: Too numerous to count pulmonary nodules some of which demonstrate cavitation are identified scattered throughout both lungs. The dominant  nodule is seen in the posterior basal segment of the right lower lobe measuring 1.5 x 1.6 x 1.6 cm. Subpleural wedge-shaped consolidation in the lingula likely reflects an area of atelectatic lung. No pleural effusion or pneumothorax. Upper Abdomen: Morphologic changes of cirrhosis with surface nodularity of the liver noted. No definite adrenal mass. Small epigastric nodules may reflect adenopathy. Splenules are present about the spleen. Musculoskeletal: No aggressive osseous lesions. Chronic degenerative change along the thoracic and upper lumbar spine with chronic compression deformity of T12. ACDF of the included lower cervical spine. IMPRESSION: 1. Multiple, too numerous to count pulmonary nodules some which are slightly cavitary in appearance, the dominant nodule in the right lower lobe measuring 1.5 x 1.6 x 1.6 cm. Referral to pulmonology or cardiothoracic surgery is suggested. 2. Mediastinal, bilateral hilar and epigastric lymphadenopathy concerning for metastasis. 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 3. Chronic degenerative change of the midthoracic spine with chronic moderate compression deformity of T12. 4. Morphologic changes of cirrhosis. Aortic Atherosclerosis (ICD10-I70.0). Electronically Signed   By: Ashley Royalty M.D.   On: 01/01/2018 19:12        Scheduled Meds: . azithromycin  250 mg Oral Daily  . chlorhexidine  15 mL Mouth Rinse BID  . enoxaparin (LOVENOX) injection  40 mg Subcutaneous Q24H  . folic acid  1 mg Oral Daily  . furosemide  20 mg Oral Daily  . lactulose  20 g Oral BID  . LORazepam  0-4 mg Intravenous Q6H   Or  . LORazepam  0-4 mg Oral Q6H  . [START ON 01/03/2018] LORazepam  0-4 mg Intravenous Q12H   Or  . [START ON 01/03/2018] LORazepam  0-4 mg Oral Q12H  . mouth rinse  15 mL Mouth Rinse q12n4p  . nadolol  20 mg Oral Daily  . nortriptyline  150 mg Oral QHS  . pantoprazole  40 mg Oral Daily  .  predniSONE  40 mg Oral Q breakfast  . thiamine  100 mg Oral Daily   Or  . thiamine  100 mg Intravenous Daily  . tiotropium  1 capsule Inhalation q morning - 10a   Continuous Infusions:   LOS: 0 days     Cordelia Poche, MD [facility] 01/02/2018, 2:32 PM  If 7PM-7AM, please contact night-coverage [email or web address] 01/02/2018, 2:32 PM

## 2018-01-02 NOTE — Progress Notes (Signed)
  Echocardiogram 2D Echocardiogram has been performed.  Michael Mcfarland 01/02/2018, 11:25 AM

## 2018-01-02 NOTE — Progress Notes (Signed)
Modified Barium Swallow Progress Note  Patient Details  Name: Michael Mcfarland MRN: [id number] Date of Birth: [birth year]  Today's Date: 01/02/2018  Modified Barium Swallow completed.  Full report located under Chart Review in the Imaging Section.  Brief recommendations include the following:  Clinical Impression  MBS was completed using thin liquids, nectar thick liquids, pureed material and moist solids.  The patient presented with a moderate pharyngeal dysphagia characterized by the following physiologic deficits:  decreased laryngeal elevation, decreased laryngeal vestibule closure, intermittently decreased epiglottic inversion and decreased base of tongue retraction.  Given thin liquids material reached level of pyriform sinuses prior to the swallow trigger.  All other material generally reached the vallecula prior to the swallow trigger.   These physiologic deficits led to vallecula residue across all textures and penetration during and after the swallow.  Suspect the patient is not sensate to this residue as he is pointing to the esophageal region when he complains of material not wanting to go down.        The patient was observed to penetrate thin liquids via cup sips into the laryngeal vestibule during the swallow.  On the cued cough and reswallow he was noted to penetrate to the level of the vocal cords on the pharyngeal residue that was present during the swallow.   Following trial of nectar thick liquids while fluoroscopy was turned off patient appeared to penetrate the pharyngeal residue to the level of the vocal cords as material was seen on the vocal cords when fluoroscopy was turned back on.  The patient was not sensate to this and never made an attempt to spontaneously cough.        Use of a chin tuck was helpful in preventing the penetration during the swallow.  However, patient is at high risk for penetration/aspiration after the swallow given pharyngeal residue and issues with  penetration after the swallow from pharyngeal residue seen on this study.  Esophageal sweep revealed it slow to clear.   Liquid wash was helpful but did not completely assist in esophageal clearance.        Recommend a regular diet with thin liquids.  Patient should tuck chin for all liquid boluses.  Encouraged aggressive oral care PRIOR to intake to mitigate aspiration risk.  ST will follow during acute stay.  He would benefit from ST follow up after DC.    MD - patient would also benefit from ENT consult when appropriate to assess movement of his vocal cords.   Swallow Evaluation Recommendations   Recommended Consults: Consider ENT evaluation   SLP Diet Recommendations: Thin liquid;Regular solids   Liquid Administration via: Straw;Cup   Medication Administration: Whole meds with puree   Supervision: Patient able to self feed   Compensations: Chin tuck;Small sips/bites   Postural Changes: Seated upright at 90 degrees;Remain semi-upright after after feeds/meals (Comment)   Oral Care Recommendations: Oral care before and after [place], MA, CCC-SLP Acute Rehab SLP [phone]  Lamar Sprinkles 01/02/2018,2:01 PM

## 2018-01-03 ENCOUNTER — Telehealth: Payer: Self-pay | Admitting: Family Medicine

## 2018-01-03 ENCOUNTER — Observation Stay (HOSPITAL_COMMUNITY): Payer: Self-pay

## 2018-01-03 ENCOUNTER — Other Ambulatory Visit (HOSPITAL_COMMUNITY)
Admission: RE | Admit: 2018-01-03 | Discharge: 2018-01-03 | Disposition: A | Discharge status: Home / Self Care | Payer: Self-pay | Source: Ambulatory Visit | Attending: Internal Medicine | Admitting: Internal Medicine

## 2018-01-03 DIAGNOSIS — R410 Disorientation, unspecified: Secondary | ICD-10-CM

## 2018-01-03 DIAGNOSIS — R221 Localized swelling, mass and lump, neck: Secondary | ICD-10-CM | POA: Insufficient documentation

## 2018-01-03 DIAGNOSIS — J441 Chronic obstructive pulmonary disease with (acute) exacerbation: Secondary | ICD-10-CM

## 2018-01-03 DIAGNOSIS — I8510 Secondary esophageal varices without bleeding: Secondary | ICD-10-CM

## 2018-01-03 DIAGNOSIS — R918 Other nonspecific abnormal finding of lung field: Secondary | ICD-10-CM

## 2018-01-03 DIAGNOSIS — D696 Thrombocytopenia, unspecified: Secondary | ICD-10-CM

## 2018-01-03 LAB — BASIC METABOLIC PANEL
Anion gap: 11 (ref 5–15)
BUN: 18 mg/dL (ref 6–20)
CO2: 25 mmol/L (ref 22–32)
CREATININE: 0.82 mg/dL (ref 0.61–1.24)
Calcium: 10.1 mg/dL (ref 8.9–10.3)
Chloride: 105 mmol/L (ref 98–111)
GFR calc Af Amer: >60 mL/min (ref >60)
GFR calc non Af Amer: >60 mL/min (ref >60)
Glucose, Bld: 124 mg/dL — ABNORMAL HIGH (ref 70–99)
Potassium: 3.7 mmol/L (ref 3.5–5.1)
SODIUM: 141 mmol/L (ref 135–145)

## 2018-01-03 LAB — CBC
HCT: 48.4 % (ref 39.0–52.0)
Hemoglobin: 16.3 g/dL (ref 13.0–17.0)
MCH: 33.7 pg (ref 26.0–34.0)
MCHC: 33.7 g/dL (ref 30.0–36.0)
MCV: 100 fL (ref 78.0–100.0)
PLATELETS: 125 10*3/uL — AB (ref 150–400)
RBC: 4.84 MIL/uL (ref 4.22–5.81)
RDW: 15.7 % — ABNORMAL HIGH (ref 11.5–15.5)
WBC: 11.9 10*3/uL — ABNORMAL HIGH (ref 4.0–10.5)

## 2018-01-03 LAB — MAGNESIUM: Magnesium: 2 mg/dL (ref 1.7–2.4)

## 2018-01-03 MED ORDER — FENTANYL CITRATE (PF) 100 MCG/2ML IJ SOLN
INTRAMUSCULAR | Status: AC
  Filled 2018-01-03: qty 2

## 2018-01-03 MED ORDER — MIDAZOLAM HCL 2 MG/2ML IJ SOLN
INTRAMUSCULAR | Status: AC
  Filled 2018-01-03: qty 4

## 2018-01-03 MED ORDER — PREDNISONE 20 MG PO TABS: 40.0000 mg | ORAL_TABLET | Freq: Every day | ORAL | 0 refills | Status: AC

## 2018-01-03 MED ORDER — THIAMINE HCL 100 MG PO TABS: 100.0000 mg | ORAL_TABLET | Freq: Every day | ORAL | Status: AC

## 2018-01-03 MED ORDER — FENTANYL CITRATE (PF) 100 MCG/2ML IJ SOLN
INTRAMUSCULAR | Status: AC | PRN
  Administered 2018-01-03 (×2): 50 ug via INTRAVENOUS

## 2018-01-03 MED ORDER — LIDOCAINE HCL (PF) 1 % IJ SOLN
INTRAMUSCULAR | Status: AC | PRN
  Administered 2018-01-03: 10 mL

## 2018-01-03 MED ORDER — MIDAZOLAM HCL 2 MG/2ML IJ SOLN
INTRAMUSCULAR | Status: AC | PRN
  Administered 2018-01-03 (×2): 1 mg via INTRAVENOUS

## 2018-01-03 MED ORDER — LIDOCAINE-EPINEPHRINE (PF) 2 %-1:200000 IJ SOLN
INTRAMUSCULAR | Status: AC
  Filled 2018-01-03: qty 20

## 2018-01-03 NOTE — Discharge Instructions (Addendum)
Michael Mcfarland,  You were admitted because of shortness of breath. This was secondary to a COPD exacerbation. Your heart function was good. Please continue the breathing treatments in addition to the steroids. You were also found to have multiple lung nodules. You had a biopsy of a mass of your neck. Please follow-up with your primary care physician, pulmonology (if needed) and the oncologist.

## 2018-01-03 NOTE — Progress Notes (Signed)
MEDICATION-RELATED CONSULT NOTE   IR Procedure Consult - Anticoagulant/Antiplatelet PTA/Inpatient Med List Review by Pharmacist    Procedure: left cervical lymph node biopsy    Completed: 01/03/18 at ~1:55p  Post-Procedural bleeding risk per IR MD assessment:  standard  Antithrombotic medications on inpatient or PTA profile prior to procedure:   Lovenox 40 mg SQ daily    Recommended restart time per IR Post-Procedure Guidelines:  Next day (in the morning)   Plan:     - resume lovenox 40 mg SQ daily on 01/04/18 at [place] will sign off.  Re-consult [place] if need further assistance.    Thank you for asking pharmacy to participate in this patient's care.  Dia Sitter, PharmD, BCPS 01/03/2018 2:28 PM

## 2018-01-03 NOTE — Progress Notes (Signed)
SATURATION QUALIFICATIONS: (This note is used to comply with regulatory documentation for home oxygen)  Patient Saturations on Room Air at Rest = 96%  Patient Saturations on Room Air while Ambulating = 92%  Patient Saturations on 0 Liters of oxygen while Ambulating = %  Please briefly explain why patient needs home oxygen:no oxygen needed

## 2018-01-03 NOTE — Procedures (Signed)
Pre Procedure Dx: Cervical lymphadenopathy Post Procedural Dx: Same  Technically successful US guided biopsy of indeterminate left cervical lymph node.  EBL: None  No immediate complications.   Ronny Bacon, MD Pager #: [phone]

## 2018-01-03 NOTE — Progress Notes (Signed)
Patient came back from IR biopsy to [place] at 1410. Alert and oriented x3. Keep patient in bed in 1hr (until 1510)

## 2018-01-03 NOTE — Telephone Encounter (Signed)
Copied from [place] [phone]. Topic: Quick Communication - See Telephone Encounter >> Jan 03, 2018 10:09 AM Gardiner Ramus wrote: CRM for notification. See Telephone encounter for: 01/03/18.Tiotropium Bromide Monohydrate (SPIRIVA RESPIMAT) 2.5 MCG/ACT AERS [[id number]]  pt sister Mariann Laster called and stated that medication was $400 with the good rx card. She would like to know if something more affordable could be called in. HE#[id number]

## 2018-01-03 NOTE — Consult Note (Signed)
NAME:  Michael Mcfarland, MRN:  [id number], DOB:  July 27, 1965, LOS: 0 ADMISSION DATE:  01/01/2018, CONSULTATION DATE: 10/7 REFERRING MD: Lonny Prude, CHIEF COMPLAINT: Abnormal CT scan as well as COPD  Brief History   52 year old smoker with history of COPD, and recently identified cervical lymphadenopathy admitted on 10/5 with worsening shortness of breath and intermittent delirium.  CT of chest ordered showing too numerous to count pulmonary lymphadenopathy Pulmonary asked to evaluate  Past Medical History  Hypertension, COPD, alcohol abuse, seizures related to withdrawal, recently identified lymphadenopathy cervical region with voice change, prior subdural hematoma Significant Hospital Events   Admitted on 10/5 with shortness of breath and mental status change.  CT chest obtained demonstrating diffuse adenopathy pulmonary asked to see   Consults: date of consult/date signed off & final recs:  Pulmonary 10/7  Procedures (surgical and bedside):    Significant Diagnostic Tests:  CT head 10/5: Negative CT chest 10/5:1. Multiple, too numerous to count pulmonary nodules some which are slightly cavitary in appearance, the dominant nodule in the right lower lobe measuring 1.5 x 1.6 x 1.6 cm. Referral to pulmonology or cardiothoracic surgery is suggested. 2. Mediastinal, bilateral hilar and epigastric lymphadenopathy concerning for metastasis. 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 3. Chronic degenerative change of the midthoracic spine with chronic moderate compression deformity of T12. 4. Morphologic changes of cirrhosis. Modified barium swallow on 10/6: Right sided lingular weakness with little to no vocal cord closure.  Essentially aphonic.  Laryngeal movement was diminished.  There was intermittent cough.  No aspiration but was felt moderate aspiration risk  Micro Data:   Antimicrobials:    Subjective:  Denies pain or  shortness of breath currently  Objective   Blood pressure 138/87, pulse 80, temperature 97.8 F (36.6 C), temperature source Oral, resp. rate 20, height 5\' 10"  (1.778 m), weight 120.4 kg, SpO2 95 %.        Intake/Output Summary (Last 24 hours) at 01/03/2018 1041 Last data filed at 01/03/2018 8546 Gross per 24 hour  Intake 480 ml  Output no documentation  Net 480 ml   Filed Weights   01/01/18 2020  Weight: 120.4 kg    Examination: General: 52 year old white male currently in no acute distress, aphonic HENT: Normocephalic atraumatic no jugular venous distention he does have a palpable cervical lymph node particularly on the left Lungs: Clear to auscultation without accessory use Cardiovascular: Regular rate and rhythm Abdomen: Warm and dry Extremities: Brisk cap refill no edema Neuro: Awake oriented GU: Voiding  Resolved Hospital Problem list   Acute encephalopathy  Assessment & Plan:  Diffuse lymphadenopathy involving the chest, but also cervical region left greater than right -Suspect this reflects a stage IV malignancy Plan/rec Proceed with interventional radiology guided core biopsy of lymph node Would recommend early oncology evaluation No role for bronchoscopy   COPD Plan/rec Continue home medications  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 for now Pain/Anxiety/Delirium protocol (if indicated): Not indicated VAP protocol (if indicated): Not indicated DVT prophylaxis: Holding for biopsy GI prophylaxis: not Indicated Hyperglycemia protocol: Not indicated Mobility: As able Code Status: Full  code Family Communication: Sister updated  at length in regards to plan of care  Labs   CBC: Recent Labs  Lab 01/01/18 1509 01/02/18 0527 01/03/18 0516  WBC 7.4 6.8 11.9*  NEUTROABS 4.6  --   --   HGB 16.5 16.7 16.3  HCT 48.6 48.2 48.4  MCV 99.4 98.4 100.0  PLT 121* 118* 125*    Basic Metabolic Panel: Recent Labs  Lab 01/01/18 1509 01/02/18 0527 01/03/18 0516  NA 142 141 141  K 4.0 3.8 3.7  CL 106 103 105  CO2 25 26 25   GLUCOSE 81 165* 124*  BUN 14 16 18   CREATININE 0.93 0.96 0.82  CALCIUM 10.1 10.5* 10.1  MG  --  1.9 2.0   GFR: Estimated Creatinine Clearance: 134 mL/min (by C-G formula based on SCr of 0.82 mg/dL). Recent Labs  Lab 01/01/18 1509 01/02/18 0527 01/03/18 0516  WBC 7.4 6.8 11.9*    Liver Function Tests: Recent Labs  Lab 01/01/18 1509  AST 59*  ALT 21  ALKPHOS 151*  BILITOT 2.4*  PROT 7.1  ALBUMIN 3.3*   No results for input(s): LIPASE, AMYLASE in the last 168 hours. Recent Labs  Lab 01/01/18 1509  AMMONIA 41*    ABG    Component Value Date/Time   PHART 7.471 (H) 01/01/2018 1805   PCO2ART 36.8 01/01/2018 1805   PO2ART 61.3 (L) 01/01/2018 1805   HCO3 26.5 01/01/2018 1805   O2SAT 91.7 01/01/2018 1805     Coagulation Profile: Recent Labs  Lab 01/01/18 1509  INR 1.40    Cardiac Enzymes: Recent Labs  Lab 01/01/18 1509  TROPONINI <0.03    HbA1C: Hgb A1c MFr Bld  Date/Time Value Ref Range Status  02/10/2017 02:00 PM 5.6 <5.7 % of total Hgb Final    Comment:    For the purpose of screening for the presence of diabetes: . <5.7%       Consistent with the absence of diabetes 5.7-6.4%    Consistent with increased risk for diabetes             (prediabetes) > or =6.5%  Consistent with diabetes . This assay result is consistent with a decreased risk of diabetes. . Currently, no consensus exists regarding use of hemoglobin A1c for diagnosis of diabetes in children. . According to American Diabetes Association  (ADA) guidelines, hemoglobin A1c <7.0% represents optimal control in non-pregnant diabetic patients. Different metrics may apply to specific patient populations.  Standards of Medical Care in Diabetes(ADA). .     CBG: No results for input(s): GLUCAP in the last 168 hours.  Admitting History of Present Illness.   This is a 52 year old male patient, with history of alcoholic cirrhosis, prior subdural hematoma, and COPD.  Also recently discovered cervical lymphadenopathy and vocal hoarseness x2 months raising concern for metastatic disease.  He actually was to be referred to our office pending CT results of the chest.  In the meantime he was brought to the emergency room accompanied by his sister on 10/5 with new delirium and confusion as well as worsening shortness of breath.  Of note he has a history of alcoholism, and drinks daily.  He was treated for COPD exacerbation as well as supportively for his delirium.  He had extensive CT imaging obtained CT head was negative.  CT chest was obtained, this was extensive showing right paratracheal 2.4 cm lymph node, left paratracheal 2.1 cm lymph node, a large AP window lymph node of 2.3 cm, right tracheobronchial 2.5 cm short axis lymph node, left  hilar lymph node at 2.7 cm and subcarinal with other smaller prevascular and left lower lobe paratracheal lymph nodes present some of which we are showing cavitation.  Interventional radiology was consulted for ultrasound-guided biopsy of cervical lymph nodes.  We were asked to see in regards to his CT findings  Review of Systems:   Review of Systems - History obtained from the patient General ROS: positive for  - fatigue Psychological ROS: positive for - anxiety, behavioral disorder, concentration difficulties and disorientation negative for - depression, hallucinations, irritability, obsessive thoughts, physical abuse or sexual abuse ENT ROS: positive for - nasal congestion negative for - epistaxis or  headaches Hematological and Lymphatic ROS: positive for - swollen lymph nodes negative for - bleeding problems, blood clots, blood transfusions or bruising Endocrine ROS: negative Respiratory ROS: positive for - cough, shortness of breath, sputum changes and sputum is discolored  negative for - hemoptysis, orthopnea, pleuritic pain, tachypnea or wheezing Cardiovascular ROS: no chest pain or dyspnea on exertion negative for - irregular heartbeat, loss of consciousness, murmur, orthopnea, palpitations or paroxysmal nocturnal dyspnea Gastrointestinal ROS: no abdominal pain, change in bowel habits, or black or bloody stools Musculoskeletal ROS: negative Neurological ROS: negative  Past Medical History  He,  has a past medical history of Alcohol abuse, Alcohol related seizure ([place]) (01/17/2017), Anxiety, Arthritis, Cirrhosis, alcoholic ([place]) (60/7371), COPD (chronic obstructive pulmonary disease) ([place]), Depression, Emphysema lung ([facility]), Frequent headaches, GERD (gastroesophageal reflux disease), Hay fever, Hepatitis B, IBS (irritable bowel syndrome), Migraine, Panic attacks, Pneumonia, Portal hypertensive gastropathy ([place]), Seizure ([place]) (05/20/2017 X 2), and Stroke ([place]).   Surgical History    Past Surgical History:  Procedure Laterality Date  . ANTERIOR CERVICAL DECOMP/DISCECTOMY FUSION    . ESOPHAGOGASTRODUODENOSCOPY N/A 06/18/2017   Procedure: ESOPHAGOGASTRODUODENOSCOPY (EGD);  Surgeon: Doran Stabler, MD;  Location: [facility];  Service: Gastroenterology;  Laterality: N/A;  . LUMBAR DISC SURGERY       Social History   Social History   Socioeconomic History  . Marital status: Widowed    Spouse name: Not on file  . Number of children: 1  . Years of education: Not on file  . Highest education level: Not on file  Occupational History  . Occupation: Dealer    Comment: self employed but stopped working ~ 06-27-2009 following death of wife and after having neck surgery.    Social  Needs  . Financial resource strain: Not on file  . Food insecurity:    Worry: Not on file    Inability: Not on file  . Transportation needs:    Medical: Not on file    Non-medical: Not on file  Tobacco Use  . Smoking status: Current Some Day Smoker    Packs/day: 0.25    Years: 35.00    Pack years: 8.75    Types: Cigarettes  . Smokeless tobacco: Never Used  . Tobacco comment: quit smoking after admission 04/2017  Substance and Sexual Activity  . Alcohol use: Not Currently    Alcohol/week: 18.0 standard drinks    Types: 18 Cans of beer per week    Comment: until admission 04/2017 drinking 12 to 18 beers daily.  since then, no beer or other ETOH (as of 06/17/17)  . Drug use: Not Currently    Comment: 05/20/2017 "been a long time; I tried different things when I was younger"  . Sexual activity: Not Currently    Comment: widowed in 06/27/09  Lifestyle  . Physical activity:    Days per  week: Not on file    Minutes per session: Not on file  . Stress: Not on file  Relationships  . Social connections:    Talks on phone: Not on file    Gets together: Not on file    Attends religious service: Not on file    Active member of club or organization: Not on file    Attends meetings of clubs or organizations: Not on file    Relationship status: Not on file  . Intimate partner violence:    Fear of current or ex partner: Not on file    Emotionally abused: Not on file    Physically abused: Not on file    Forced sexual activity: Not on file  Other Topics Concern  . Not on file  Social History Narrative  . Not on file  ,  reports that he has been smoking cigarettes. He has a 8.75 pack-year smoking history. He has never used smokeless tobacco. He reports that he drank about 18.0 standard drinks of alcohol per week. He reports that he has current or past drug history.   Family History   His family history includes Arthritis in his father and mother; Depression in his mother; Diabetes in his mother  and sister; Heart attack in his paternal grandfather; Heart disease in his brother and father; Hypertension in his mother; Liver disease in his sister.   Allergies No Known Allergies   Home Medications  Prior to Admission medications   Medication Sig Start Date End Date Taking? Authorizing Provider  albuterol (PROVENTIL HFA;VENTOLIN HFA) 108 (90 Base) MCG/ACT inhaler Inhale 2 puffs into the lungs every 6 (six) hours as needed for wheezing or shortness of breath. 12/28/17  Yes [name], MD  baclofen (LIORESAL) 10 MG tablet Take 1 tablet (10 mg total) by mouth 3 (three) times daily. 11/12/17  Yes [name], MD  Chlorpheniramine Maleate (ALLERGY PO) Take 1 tablet by mouth daily as needed (allergies).   Yes [provider]  diclofenac (VOLTAREN) 50 MG EC tablet Take 1 tablet (50 mg total) by mouth 2 (two) times daily. 11/12/17  Yes [name], MD  feeding supplement, ENSURE ENLIVE, (ENSURE ENLIVE) LIQD Take 237 mLs by mouth 2 (two) times daily between meals. 06/18/17  Yes Kayleen Memos, DO  folic acid (FOLVITE) 1 MG tablet Take 1 tablet (1 mg total) by mouth daily. 07/24/17  Yes Emokpae, Courage, MD  ipratropium (ATROVENT) 0.06 % nasal spray Place 2 sprays into both nostrils 4 (four) times daily. 11/12/17  Yes [name], MD  lactulose [facility]) 10 GM/15ML solution Take 55mL one to three times daily to titrate for 2-3 soft stools per day 11/12/17  Yes [name], MD  Multiple Vitamin (MULTIVITAMIN WITH MINERALS) TABS tablet Take 1 tablet by mouth daily. 07/25/17  Yes Emokpae, Courage, MD  nadolol (CORGARD) 20 MG tablet Take 1 tablet (20 mg total) by mouth daily. 11/12/17  Yes [name], MD  nortriptyline (PAMELOR) 75 MG capsule Take 2 capsules (150 mg total) by mouth at bedtime. 12/28/17  Yes [name], MD  nystatin (MYCOSTATIN/NYSTOP) powder Apply topically 4 (four) times daily. 11/12/17  Yes [name], MD  pantoprazole (PROTONIX) 40 MG tablet Take 1  tablet (40 mg total) by mouth daily at 12 noon. 07/08/17  Yes [name], MD  Tiotropium Bromide Monohydrate (SPIRIVA RESPIMAT) 2.5 MCG/ACT AERS Inhale 2 puffs into the lungs daily. 12/28/17  Yes [name],  MD     Erick Colace ACNP-BC [place] Pager # [phone] OR # [phone] if no answer

## 2018-01-03 NOTE — Discharge Summary (Signed)
Physician Discharge Summary  Michael Mcfarland WYO:[id number] DOB: October 13, 1965 DOA: 01/01/2018  PCP: [name], MD  Admit date: 01/01/2018 Discharge date: 01/03/2018  Admitted From: Home Disposition: Home  Recommendations for Outpatient Follow-up:  1. Follow up with PCP in 1 week 2. Follow up with Medical oncology in 1-2 weeks 3. Please follow up on the following pending results: Core biopsy  Home Health: None Equipment/Devices: None  Discharge Condition: Stable CODE STATUS: No CPR Diet recommendation: Heart healthy   Brief/Interim Summary:  Admission HPI written by Colbert Ewing, MD   Chief Complaint: transient delirium, dyspnea and relative hypoxia  HPI: Michael Mcfarland is a 52 y.o. male with hx of alcoholism with hx of withdrawal seizures, alcoholic cirrhosis, prior SDH, HTN, COPD and recently discovered cervical LAD and hoarseness x 2 months concerning for metastatic disease who presented with episodes of delirium. Per ED provider, his sister brought him to [facility] ED due to episodes of confusion and strange behavior for the past several weeks. Of note he has also had neck swelling, mild dysphagia, hoarsness x 2 months and a recent CT soft neck ordered by PCP showed extensive cervical LAD and L vocal paresis concerning for a primary lung malignancy. Outpatient CT chest was ordered but not yet completed. Patient was calm and cooperative during my exam and does recall any particular event that has brought him to the hospital, although does report neck discomfort and increasing SOB. He has still been able to maintain PO intake. Last drink was yesterday ~ per sister's report he drinks about 8 bottles of wine/day. Denies SI/HI or auditory or visual hallucinations.   Review of Systems:  + dyspnea, hoarseness + reported prior episodes of altered mentation - no fevers/chills - no cough - no chest pain  - no edema, PND, orthopnea - no nausea/vomiting; no tarry, melanotic or bloody  stools - no dysuria, increased urinary frequency - no weight changes Rest of systems reviewed are negative, except as per above history.   ED course:  Vitals Blood pressure (!) 147/97, pulse 79, temperature 97.6 F (36.4 C), temperature source Oral, resp. rate (!) 24, SpO2 92 %. Reported desat to 88%while ambulating to restroom. SO2 was 94% during my exam.  Received albulterol and atrovent nebs x2; lasix 40mg  IV x 1; solumedrol 125mg  x 1;    Hospital course:  Delirium In setting of alcohol abuse. Unclear etiology, however. History of hepatic encephalopathy. Improved to baseline. UDS insignificant. Continued lactulose  COPD exacerbation Mild. Started on nebulizer treatments in addition to prednisone and azithromycin Improved to baseline. Complete prednisone burst. Outpatient pulmonology per PCP recommendations. Patient ambulated with pulse oximetry and significant for resting saturation of 96% and ambulatory saturation of 92%.  Shortness of breath Thought secondary to COPD, however, BNP was elevated. Transthoracic Echocardiogram only with mild TR. Lasix given but discontinued. Improved with treatment of COPD exacerbation  Neck adenopathy Multiple lung nodules Pulmonology consulted. Recommend medical oncology follow-up. Neck adenopathy biopsy performed 01/03/18. Results pending. Patient to follow-up with Medical oncology.  Alcoholic cirrhosis Alcohol abuse History of withdrawal in the past. CIWA with scheduled and prn ativan. Low scores.  Thrombocytopenia In setting of cirrhosis. Stable. No bleeding  Esophageal varices Non bleeding currently. Continued nadolol  Elevated blood pressure Worsened on admission. Possibly secondary to withdrawal symptoms. Improved prior to discharge. Continued Nadolol  Tobacco abuse Counseled. Declined nicotine patch. States he plans to quit.  Discharge Diagnoses:  Active Problems:   Cirrhosis ([place])  Thrombocytopenia ([place])   Nicotine  dependence with current use   Pulmonary nodules   Delirium   COPD with acute exacerbation ([place])   Secondary esophageal varices without bleeding [facility])    Discharge Instructions   Allergies as of 01/03/2018   No Known Allergies     Medication List    TAKE these medications   albuterol 108 (90 Base) MCG/ACT inhaler Commonly known as:  PROVENTIL HFA;VENTOLIN HFA Inhale 2 puffs into the lungs every 6 (six) hours as needed for wheezing or shortness of breath.   ALLERGY PO Take 1 tablet by mouth daily as needed (allergies).   baclofen 10 MG tablet Commonly known as:  LIORESAL Take 1 tablet (10 mg total) by mouth 3 (three) times daily.   diclofenac 50 MG EC tablet Commonly known as:  VOLTAREN Take 1 tablet (50 mg total) by mouth 2 (two) times daily.   feeding supplement (ENSURE ENLIVE) Liqd Take 237 mLs by mouth 2 (two) times daily between meals.   folic acid 1 MG tablet Commonly known as:  FOLVITE Take 1 tablet (1 mg total) by mouth daily.   ipratropium 0.06 % nasal spray Commonly known as:  ATROVENT Place 2 sprays into both nostrils 4 (four) times daily.   lactulose 10 GM/15ML solution Commonly known as:  CHRONULAC Take 34mL one to three times daily to titrate for 2-3 soft stools per day   multivitamin with minerals Tabs tablet Take 1 tablet by mouth daily.   nadolol 20 MG tablet Commonly known as:  CORGARD Take 1 tablet (20 mg total) by mouth daily.   nortriptyline 75 MG capsule Commonly known as:  PAMELOR Take 2 capsules (150 mg total) by mouth at bedtime.   nystatin powder Commonly known as:  MYCOSTATIN/NYSTOP Apply topically 4 (four) times daily.   pantoprazole 40 MG tablet Commonly known as:  PROTONIX Take 1 tablet (40 mg total) by mouth daily at 12 noon.   predniSONE 20 MG tablet Commonly known as:  DELTASONE Take 2 tablets (40 mg total) by mouth daily with breakfast for 2 days. Start taking on:  01/04/2018   thiamine 100 MG tablet Take 1 tablet  (100 mg total) by mouth daily. Start taking on:  01/04/2018   Tiotropium Bromide Monohydrate 2.5 MCG/ACT Aers Inhale 2 puffs into the lungs daily.       No Known Allergies  Consultations:  Pulmonology  Medical Oncology (telephone)   Procedures/Studies: Dg Chest 2 View  Result Date: 01/01/2018 CLINICAL DATA:  Low back pain and shortness of breath for 2 months. EXAM: CHEST - 2 VIEW COMPARISON:  PA and lateral chest 06/15/2017, 05/20/2017 and 01/17/2017. FINDINGS: Hazy bilateral pulmonary opacities are identified. No pneumothorax or pleural effusion. Heart size is enlarged. No acute or focal bony abnormality. IMPRESSION: Hazy bilateral pulmonary opacities have an appearance most consistent with pulmonary edema. Electronically Signed   By: Inge Rise M.D.   On: 01/01/2018 16:33   Dg Lumbar Spine Complete  Result Date: 01/01/2018 CLINICAL DATA:  Low back pain EXAM: LUMBAR SPINE - COMPLETE 4+ VIEW COMPARISON:  07/31/2008 FINDINGS: Degenerative spurring anteriorly. Slight disc space narrowing at L5-S1. Moderate compression deformity at T12, age indeterminate but new since 2010. No subluxation. SI joints are symmetric and unremarkable. IMPRESSION: Moderate compression deformity at T12, age indeterminate but new since 2010. Electronically Signed   By: Rolm Baptise M.D.   On: 01/01/2018 15:40   Ct Head Wo Contrast  Result Date: 01/01/2018 CLINICAL DATA:  Altered mental status  EXAM: CT HEAD WITHOUT CONTRAST TECHNIQUE: Contiguous axial images were obtained from the base of the skull through the vertex without intravenous contrast. COMPARISON:  07/17/2017 FINDINGS: Brain: No acute intracranial abnormality. Specifically, no hemorrhage, hydrocephalus, mass lesion, acute infarction, or significant intracranial injury. Vascular: No hyperdense vessel or unexpected calcification. Skull: No acute calvarial abnormality. Sinuses/Orbits: Visualized paranasal sinuses and mastoids clear. Orbital soft  tissues unremarkable. Other: None IMPRESSION: No acute intracranial abnormality. Electronically Signed   By: Rolm Baptise M.D.   On: 01/01/2018 15:41   Ct Soft Tissue Neck W Contrast  Result Date: 12/24/2017 CLINICAL DATA:  Lymphadenopathy. Left-sided neck knot for a couple of months. Hoarseness for 2 weeks. History of smoking and cervical spine fusion. EXAM: CT NECK WITH CONTRAST TECHNIQUE: Multidetector CT imaging of the neck was performed using the standard protocol following the bolus administration of intravenous contrast. CONTRAST:  18mL ISOVUE-300 IOPAMIDOL (ISOVUE-300) INJECTION 61% COMPARISON:  Cervical spine CT 05/20/2017 FINDINGS: Pharynx and larynx: Slight asymmetric fullness of the left posterolateral oropharyngeal soft tissues without differential enhancing mass evident. Prominent asymmetric enlargement of the left laryngeal ventricle. Slight asymmetric enlargement of the left piriform sinus. Salivary glands: Subcentimeter soft tissue nodules in the parotid glands, likely intraparotid lymph nodes. Fatty infiltration of the submandibular glands. No evidence of acute inflammation. Thyroid: Unremarkable. Lymph nodes: Low-density bilateral supraclavicular lymphadenopathy, bulky on the left and extending superiorly into level III. The largest individual node measures 2.5 cm in short axis in the left level IV/supraclavicular region. Right supraclavicular nodes measure up to 1.3 cm in short axis. Vascular: Mild-to-moderate calcified atherosclerosis about the right greater than left carotid bifurcations. Aortic arch atherosclerosis. Limited intracranial: Unremarkable. Mastoids and visualized paranasal sinuses: Mild bilateral maxillary sinus mucosal thickening. Clear mastoid air cells. Skeleton: Fixation wires at the angle of the mandible on the left. Dental caries and periapical lucencies associated with maxillary and mandibular molar teeth. Solid C5-C7 ACDF. No suspicious osseous lesion. Upper chest:  Partially visualized low-density mediastinal lymphadenopathy including 2.2 cm short axis right paratracheal lymph node and 2.4 cm prevascular lymph node. Centrilobular emphysema. Small right upper lobe lung nodules measuring up to 5 mm in size. Other: None. IMPRESSION: 1. Left greater than right cervical and mediastinal lymphadenopathy consistent with metastatic disease. Chest CT is recommended to evaluate for a primary lung malignancy. 2. Findings suggesting left vocal cord paresis. 3. Small right upper lobe lung nodules measuring up to 5 mm in size. 4. Aortic Atherosclerosis (ICD10-I70.0) and Emphysema (ICD10-J43.9). Electronically Signed   By: Logan Bores M.D.   On: 12/24/2017 15:39   Ct Chest W Contrast  Result Date: 01/01/2018 CLINICAL DATA:  Dyspnea and difficulty swallowing. EXAM: CT CHEST WITH CONTRAST TECHNIQUE: Multidetector CT imaging of the chest was performed during intravenous contrast administration. CONTRAST:  28mL OMNIPAQUE IOHEXOL 300 MG/ML  SOLN COMPARISON:  12/24/2017 neck CT FINDINGS: Cardiovascular: Atherosclerosis at the origins of the great vessels. Nonaneurysmal thoracic aorta with atherosclerosis. Moderate degree of three-vessel coronary arteriosclerosis. Heart size is top normal without pericardial effusion. No large central pulmonary embolus is identified though the study is not tailored toward assessment of pulmonary emboli. Mediastinum/Nodes: Extensive mediastinal and bilateral hilar lymphadenopathy with representative lymph nodes as follows: 1. Right paratracheal 2.4 cm short axis lymph node, series 2/48 2. Left paratracheal 2.1 cm short axis lymph node, series 2/62 3. AP window lymph node measuring 2.3 cm short axis, series 2/58 4. Right tracheobronchial 2.5 cm short axis lymph node, series 2/68 5. Left hilar lymph node measuring 2.7  cm short axis, series 2/77 6. Subcarinal 2.1 cm short axis lymph node, series 2/75 Other additional smaller prevascular and left lower paratracheal  lymph nodes are present. Lungs/Pleura: Too numerous to count pulmonary nodules some of which demonstrate cavitation are identified scattered throughout both lungs. The dominant nodule is seen in the posterior basal segment of the right lower lobe measuring 1.5 x 1.6 x 1.6 cm. Subpleural wedge-shaped consolidation in the lingula likely reflects an area of atelectatic lung. No pleural effusion or pneumothorax. Upper Abdomen: Morphologic changes of cirrhosis with surface nodularity of the liver noted. No definite adrenal mass. Small epigastric nodules may reflect adenopathy. Splenules are present about the spleen. Musculoskeletal: No aggressive osseous lesions. Chronic degenerative change along the thoracic and upper lumbar spine with chronic compression deformity of T12. ACDF of the included lower cervical spine. IMPRESSION: 1. Multiple, too numerous to count pulmonary nodules some which are slightly cavitary in appearance, the dominant nodule in the right lower lobe measuring 1.5 x 1.6 x 1.6 cm. Referral to pulmonology or cardiothoracic surgery is suggested. 2. Mediastinal, bilateral hilar and epigastric lymphadenopathy concerning for metastasis. 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 3. Chronic degenerative change of the midthoracic spine with chronic moderate compression deformity of T12. 4. Morphologic changes of cirrhosis. Aortic Atherosclerosis (ICD10-I70.0). Electronically Signed   By: Ashley Royalty M.D.   On: 01/01/2018 19:12      Subjective: Mild coughing. No sputum.  Discharge Exam: Vitals:   01/03/18 0726 01/03/18 1331  BP:  (!) 158/96  Pulse:  80  Resp:  (!) 24  Temp:    SpO2: 95% 92%   Vitals:   01/02/18 2006 01/03/18 0449 01/03/18 0726 01/03/18 1331  BP: 129/88 138/87  (!) 158/96  Pulse: 84 80  80  Resp: (!) 22 20  (!) 24  Temp: 97.9 F (36.6 C) 97.8 F (36.6 C)    TempSrc: Oral Oral    SpO2: 92% (!) 88% 95%  92%  Weight:      Height:        General: Pt is alert, awake, not in acute distress Cardiovascular: RRR, S1/S2 +, no rubs, no gallops Respiratory: Decreased breath sounds. Abdominal: Soft, NT, ND, bowel sounds + Extremities: no edema, no cyanosis    The results of significant diagnostics from this hospitalization (including imaging, microbiology, ancillary and laboratory) are listed below for reference.     Microbiology: No results found for this or any previous visit (from the past 240 hour(s)).   Labs: BNP (last 3 results) Recent Labs    01/01/18 1821  BNP 656.8*   Basic Metabolic Panel: Recent Labs  Lab 01/01/18 1509 01/02/18 0527 01/03/18 0516  NA 142 141 141  K 4.0 3.8 3.7  CL 106 103 105  CO2 25 26 25   GLUCOSE 81 165* 124*  BUN 14 16 18   CREATININE 0.93 0.96 0.82  CALCIUM 10.1 10.5* 10.1  MG  --  1.9 2.0   Liver Function Tests: Recent Labs  Lab 01/01/18 1509  AST 59*  ALT 21  ALKPHOS 151*  BILITOT 2.4*  PROT 7.1  ALBUMIN 3.3*   No results for input(s): LIPASE, AMYLASE in the last 168 hours. Recent Labs  Lab 01/01/18 1509  AMMONIA 41*   CBC: Recent Labs  Lab 01/01/18 1509 01/02/18 0527 01/03/18 0516  WBC 7.4 6.8 11.9*  NEUTROABS 4.6  --   --   HGB 16.5 16.7 16.3  HCT 48.6 48.2  48.4  MCV 99.4 98.4 100.0  PLT 121* 118* 125*   Cardiac Enzymes: Recent Labs  Lab 01/01/18 1509  TROPONINI <0.03   BNP: Invalid input(s): POCBNP CBG: No results for input(s): GLUCAP in the last 168 hours. D-Dimer No results for input(s): DDIMER in the last 72 hours. Hgb A1c No results for input(s): HGBA1C in the last 72 hours. Lipid Profile No results for input(s): CHOL, HDL, LDLCALC, TRIG, CHOLHDL, LDLDIRECT in the last 72 hours. Thyroid function studies Recent Labs    01/02/18 0527  TSH 0.494   Anemia work up Recent Labs    01/02/18 0527  VITAMINB12 877   Urinalysis    Component Value Date/Time   COLORURINE AMBER (A) 01/01/2018 [place] 01/01/2018 1509   LABSPEC 1.021 01/01/2018 1509   PHURINE 5.0 01/01/2018 [place] 01/01/2018 1509   HGBUR NEGATIVE 01/01/2018 [place] 01/01/2018 1509   [place] 01/01/2018 [place] 01/01/2018 1509   NITRITE NEGATIVE 01/01/2018 1509   LEUKOCYTESUR NEGATIVE 01/01/2018 1509     SIGNED:   Cordelia Poche, MD [facility] 01/03/2018, 1:43 PM

## 2018-01-03 NOTE — Consult Note (Signed)
Chief Complaint: Patient was seen in consultation today for US guided left cervical lymph node biopsy  Referring Physician(s): Nettey,R  Supervising Physician: Sandi Mariscal  Patient Status: [facility] - In-pt  History of Present Illness: Michael Mcfarland is a 52 y.o. male smoker with hx of alcoholism/ withdrawal seizures, alcoholic cirrhosis, prior SDH, HTN, COPD and recently discovered cervical LAD and hoarseness x 2 months concerning for metastatic disease who presented with episodes of delirium. No prior hx cancer. CT chest done 10/5 revealed:  1. Multiple, too numerous to count pulmonary nodules some which are slightly cavitary in appearance, the dominant nodule in the right lower lobe measuring 1.5 x 1.6 x 1.6 cm. Referral to pulmonology or cardiothoracic surgery is suggested. 2. Mediastinal, bilateral hilar and epigastric lymphadenopathy concerning for metastasis. 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 3. Chronic degenerative change of the midthoracic spine with chronic moderate compression deformity of T12. 4. Morphologic changes of cirrhosis  Request received for image guided cervical lymph node biopsy for further evaluation.   Past Medical History:  Diagnosis Date  . Alcohol abuse    /notes 01/18/2017  . Alcohol related seizure ([place]) 01/17/2017   Archie Endo 01/17/2017  . Anxiety   . Arthritis   . Cirrhosis, alcoholic ([place]) [id number]  . COPD (chronic obstructive pulmonary disease) ([place])   . Depression   . Emphysema lung ([place])   . Frequent headaches   . GERD (gastroesophageal reflux disease)   . Hay fever   . Hepatitis B   . IBS (irritable bowel syndrome)   . Migraine    frequency "depends on the weather" (01/19/2017)  . Panic attacks   . Pneumonia   . Portal hypertensive gastropathy ([place])   . Seizure ([place]) 05/20/2017 X 2  . Stroke [facility])     Past Surgical History:  Procedure Laterality Date  .  ANTERIOR CERVICAL DECOMP/DISCECTOMY FUSION    . ESOPHAGOGASTRODUODENOSCOPY N/A 06/18/2017   Procedure: ESOPHAGOGASTRODUODENOSCOPY (EGD);  Surgeon: Doran Stabler, MD;  Location: [place];  Service: Gastroenterology;  Laterality: N/A;  . LUMBAR DISC SURGERY      Allergies: Patient has no known allergies.  Medications: Prior to Admission medications   Medication Sig Start Date End Date Taking? Authorizing Provider  albuterol (PROVENTIL HFA;VENTOLIN HFA) 108 (90 Base) MCG/ACT inhaler Inhale 2 puffs into the lungs every 6 (six) hours as needed for wheezing or shortness of breath. 12/28/17  Yes [name], MD  baclofen (LIORESAL) 10 MG tablet Take 1 tablet (10 mg total) by mouth 3 (three) times daily. 11/12/17  Yes [name], MD  Chlorpheniramine Maleate (ALLERGY PO) Take 1 tablet by mouth daily as needed (allergies).   Yes [provider]  diclofenac (VOLTAREN) 50 MG EC tablet Take 1 tablet (50 mg total) by mouth 2 (two) times daily. 11/12/17  Yes [name], MD  feeding supplement, ENSURE ENLIVE, (ENSURE ENLIVE) LIQD Take 237 mLs by mouth 2 (two) times daily between meals. 06/18/17  Yes Kayleen Memos, DO  folic acid (FOLVITE) 1 MG tablet Take 1 tablet (1 mg total) by mouth daily. 07/24/17  Yes Emokpae, Courage, MD  ipratropium (ATROVENT) 0.06 % nasal spray Place 2 sprays into both nostrils 4 (four) times daily. 11/12/17  Yes [name], MD  lactulose [facility]) 10 GM/15ML solution Take 73mL one to three times daily to titrate for 2-3 soft stools per day 11/12/17  Yes [name], MD  Multiple  Vitamin (MULTIVITAMIN WITH MINERALS) TABS tablet Take 1 tablet by mouth daily. 07/25/17  Yes Emokpae, Courage, MD  nadolol (CORGARD) 20 MG tablet Take 1 tablet (20 mg total) by mouth daily. 11/12/17  Yes [name], MD  nortriptyline (PAMELOR) 75 MG capsule Take 2 capsules (150 mg total) by mouth at bedtime. 12/28/17  Yes [name], MD  nystatin (MYCOSTATIN/NYSTOP)  powder Apply topically 4 (four) times daily. 11/12/17  Yes [name], MD  pantoprazole (PROTONIX) 40 MG tablet Take 1 tablet (40 mg total) by mouth daily at 12 noon. 07/08/17  Yes [name], MD  Tiotropium Bromide Monohydrate (SPIRIVA RESPIMAT) 2.5 MCG/ACT AERS Inhale 2 puffs into the lungs daily. 12/28/17  Yes [name], MD     Family History  Problem Relation Age of Onset  . Diabetes Mother   . Depression Mother   . Hypertension Mother   . Arthritis Mother   . Arthritis Father   . Heart disease Father   . Liver disease Sister        fatty liver  . Heart disease Brother   . Heart attack Paternal Grandfather   . Diabetes Sister     Social History   Socioeconomic History  . Marital status: Widowed    Spouse name: Not on file  . Number of children: 1  . Years of education: Not on file  . Highest education level: Not on file  Occupational History  . 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    Social Needs  . Financial resource strain: Not on file  . Food insecurity:    Worry: Not on file    Inability: Not on file  . Transportation needs:    Medical: Not on file    Non-medical: Not on file  Tobacco Use  . Smoking status: Current Some Day Smoker    Packs/day: 0.25    Years: 35.00    Pack years: 8.75    Types: Cigarettes  . Smokeless tobacco: Never Used  . Tobacco comment: quit smoking after admission 04/2017  Substance and Sexual Activity  . Alcohol use: Not Currently    Alcohol/week: 18.0 standard drinks    Types: 18 Cans of beer per week    Comment: until admission 04/2017 drinking 12 to 18 beers daily.  since then, no beer or other ETOH (as of 06/17/17)  . Drug use: Not Currently    Comment: 05/20/2017 "been a long time; I tried different things when I was younger"  . Sexual activity: Not Currently    Comment: widowed in 06/23/2009  Lifestyle  . Physical activity:     Days per week: Not on file    Minutes per session: Not on file  . Stress: Not on file  Relationships  . Social connections:    Talks on phone: Not on file    Gets together: Not on file    Attends religious service: Not on file    Active member of club or organization: Not on file    Attends meetings of clubs or organizations: Not on file    Relationship status: Not on file  Other Topics Concern  . Not on file  Social History Narrative  . Not on file      Review of Systems denies fever, CP,abd pain,N/V or bleeding; he is hoarse with dyspnea with exertion/cough, back/neck pain  Vital Signs: BP 138/87  Pulse 80   Temp 97.8 F (36.6 C) (Oral)   Resp 20   Ht 5\' 10"  (1.778 m)   Wt 265 lb 6.9 oz (120.4 kg)   SpO2 95%   BMI 38.09 kg/m   Physical Exam awake/answers questions appropriately; chest- distant BS bilat; heart- RRR; abd- soft,+BS,NT; no LE edema; palpable left lower cervical adenopathy  Imaging: Dg Chest 2 View  Result Date: 01/01/2018 CLINICAL DATA:  Low back pain and shortness of breath for 2 months. EXAM: CHEST - 2 VIEW COMPARISON:  PA and lateral chest 06/15/2017, 05/20/2017 and 01/17/2017. FINDINGS: Hazy bilateral pulmonary opacities are identified. No pneumothorax or pleural effusion. Heart size is enlarged. No acute or focal bony abnormality. IMPRESSION: Hazy bilateral pulmonary opacities have an appearance most consistent with pulmonary edema. Electronically Signed   By: Inge Rise M.D.   On: 01/01/2018 16:33   Dg Lumbar Spine Complete  Result Date: 01/01/2018 CLINICAL DATA:  Low back pain EXAM: LUMBAR SPINE - COMPLETE 4+ VIEW COMPARISON:  07/31/2008 FINDINGS: Degenerative spurring anteriorly. Slight disc space narrowing at L5-S1. Moderate compression deformity at T12, age indeterminate but new since 2010. No subluxation. SI joints are symmetric and unremarkable. IMPRESSION: Moderate compression deformity at T12, age indeterminate but new since 2010.  Electronically Signed   By: Rolm Baptise M.D.   On: 01/01/2018 15:40   Ct Head Wo Contrast  Result Date: 01/01/2018 CLINICAL DATA:  Altered mental status EXAM: CT HEAD WITHOUT CONTRAST TECHNIQUE: Contiguous axial images were obtained from the base of the skull through the vertex without intravenous contrast. COMPARISON:  07/17/2017 FINDINGS: Brain: No acute intracranial abnormality. Specifically, no hemorrhage, hydrocephalus, mass lesion, acute infarction, or significant intracranial injury. Vascular: No hyperdense vessel or unexpected calcification. Skull: No acute calvarial abnormality. Sinuses/Orbits: Visualized paranasal sinuses and mastoids clear. Orbital soft tissues unremarkable. Other: None IMPRESSION: No acute intracranial abnormality. Electronically Signed   By: Rolm Baptise M.D.   On: 01/01/2018 15:41   Ct Soft Tissue Neck W Contrast  Result Date: 12/24/2017 CLINICAL DATA:  Lymphadenopathy. Left-sided neck knot for a couple of months. Hoarseness for 2 weeks. History of smoking and cervical spine fusion. EXAM: CT NECK WITH CONTRAST TECHNIQUE: Multidetector CT imaging of the neck was performed using the standard protocol following the bolus administration of intravenous contrast. CONTRAST:  30mL ISOVUE-300 IOPAMIDOL (ISOVUE-300) INJECTION 61% COMPARISON:  Cervical spine CT 05/20/2017 FINDINGS: Pharynx and larynx: Slight asymmetric fullness of the left posterolateral oropharyngeal soft tissues without differential enhancing mass evident. Prominent asymmetric enlargement of the left laryngeal ventricle. Slight asymmetric enlargement of the left piriform sinus. Salivary glands: Subcentimeter soft tissue nodules in the parotid glands, likely intraparotid lymph nodes. Fatty infiltration of the submandibular glands. No evidence of acute inflammation. Thyroid: Unremarkable. Lymph nodes: Low-density bilateral supraclavicular lymphadenopathy, bulky on the left and extending superiorly into level III. The  largest individual node measures 2.5 cm in short axis in the left level IV/supraclavicular region. Right supraclavicular nodes measure up to 1.3 cm in short axis. Vascular: Mild-to-moderate calcified atherosclerosis about the right greater than left carotid bifurcations. Aortic arch atherosclerosis. Limited intracranial: Unremarkable. Mastoids and visualized paranasal sinuses: Mild bilateral maxillary sinus mucosal thickening. Clear mastoid air cells. Skeleton: Fixation wires at the angle of the mandible on the left. Dental caries and periapical lucencies associated with maxillary and mandibular molar teeth. Solid C5-C7 ACDF. No suspicious osseous lesion. Upper chest: Partially visualized low-density mediastinal lymphadenopathy including 2.2 cm short axis right paratracheal lymph node and 2.4 cm prevascular lymph  node. Centrilobular emphysema. Small right upper lobe lung nodules measuring up to 5 mm in size. Other: None. IMPRESSION: 1. Left greater than right cervical and mediastinal lymphadenopathy consistent with metastatic disease. Chest CT is recommended to evaluate for a primary lung malignancy. 2. Findings suggesting left vocal cord paresis. 3. Small right upper lobe lung nodules measuring up to 5 mm in size. 4. Aortic Atherosclerosis (ICD10-I70.0) and Emphysema (ICD10-J43.9). Electronically Signed   By: Logan Bores M.D.   On: 12/24/2017 15:39   Ct Chest W Contrast  Result Date: 01/01/2018 CLINICAL DATA:  Dyspnea and difficulty swallowing. EXAM: CT CHEST WITH CONTRAST TECHNIQUE: Multidetector CT imaging of the chest was performed during intravenous contrast administration. CONTRAST:  30mL OMNIPAQUE IOHEXOL 300 MG/ML  SOLN COMPARISON:  12/24/2017 neck CT FINDINGS: Cardiovascular: Atherosclerosis at the origins of the great vessels. Nonaneurysmal thoracic aorta with atherosclerosis. Moderate degree of three-vessel coronary arteriosclerosis. Heart size is top normal without pericardial effusion. No large  central pulmonary embolus is identified though the study is not tailored toward assessment of pulmonary emboli. Mediastinum/Nodes: Extensive mediastinal and bilateral hilar lymphadenopathy with representative lymph nodes as follows: 1. Right paratracheal 2.4 cm short axis lymph node, series 2/48 2. Left paratracheal 2.1 cm short axis lymph node, series 2/62 3. AP window lymph node measuring 2.3 cm short axis, series 2/58 4. Right tracheobronchial 2.5 cm short axis lymph node, series 2/68 5. Left hilar lymph node measuring 2.7 cm short axis, series 2/77 6. Subcarinal 2.1 cm short axis lymph node, series 2/75 Other additional smaller prevascular and left lower paratracheal lymph nodes are present. Lungs/Pleura: Too numerous to count pulmonary nodules some of which demonstrate cavitation are identified scattered throughout both lungs. The dominant nodule is seen in the posterior basal segment of the right lower lobe measuring 1.5 x 1.6 x 1.6 cm. Subpleural wedge-shaped consolidation in the lingula likely reflects an area of atelectatic lung. No pleural effusion or pneumothorax. Upper Abdomen: Morphologic changes of cirrhosis with surface nodularity of the liver noted. No definite adrenal mass. Small epigastric nodules may reflect adenopathy. Splenules are present about the spleen. Musculoskeletal: No aggressive osseous lesions. Chronic degenerative change along the thoracic and upper lumbar spine with chronic compression deformity of T12. ACDF of the included lower cervical spine. IMPRESSION: 1. Multiple, too numerous to count pulmonary nodules some which are slightly cavitary in appearance, the dominant nodule in the right lower lobe measuring 1.5 x 1.6 x 1.6 cm. Referral to pulmonology or cardiothoracic surgery is suggested. 2. Mediastinal, bilateral hilar and epigastric lymphadenopathy concerning for metastasis. Given history of difficulty swelling and vocal cord paralysis, suspect recurrent laryngeal nerve  involvement. 3. Chronic degenerative change of the midthoracic spine with chronic moderate compression deformity of T12. 4. Morphologic changes of cirrhosis. Aortic Atherosclerosis (ICD10-I70.0). Electronically Signed   By: Ashley Royalty M.D.   On: 01/01/2018 19:12    Labs:  CBC: Recent Labs    07/22/17 0242 01/01/18 1509 01/02/18 0527 01/03/18 0516  WBC 10.0 7.4 6.8 11.9*  HGB 13.8 16.5 16.7 16.3  HCT 40.1 48.6 48.2 48.4  PLT 95* 121* 118* 125*    COAGS: Recent Labs    06/03/17 1615 06/15/17 2323 07/16/17 1825 01/01/18 1509  INR 1.3* 1.44 1.38 1.40  APTT  --  39*  --   --     BMP: Recent Labs    07/24/17 0612 07/29/17 1506 01/01/18 1509 01/02/18 0527 01/03/18 0516  NA 132* 135 142 141 141  K 4.1 4.1 4.0 3.8  3.7  CL 101 101 106 103 105  CO2 21* 18* 25 26 25   GLUCOSE 104* 110* 81 165* 124*  BUN 7 9 14 16 18   CALCIUM 8.8* 9.7 10.1 10.5* 10.1  CREATININE 0.78 0.85 0.93 0.96 0.82  GFRNONAA >60  --  >60 >60 >60  GFRAA >60  --  >60 >60 >60    LIVER FUNCTION TESTS: Recent Labs    07/22/17 0242 07/24/17 0612 07/29/17 1506 01/01/18 1509  BILITOT 2.2* 2.0* 1.7* 2.4*  AST 44* 36 61* 59*  ALT 23 21 32 21  ALKPHOS 173* 166* 218* 151*  PROT 6.9 6.6 7.5 7.1  ALBUMIN 3.0* 2.8* 3.8 3.3*    TUMOR MARKERS: No results for input(s): AFPTM, CEA, CA199, CHROMGRNA in the last 8760 hours.  Assessment and Plan: 52 y.o. male smoker with hx of alcoholism/ withdrawal seizures, alcoholic cirrhosis, prior SDH, HTN, COPD and recently discovered cervical LAD and hoarseness x 2 months concerning for metastatic disease who presented with episodes of delirium. No prior hx cancer. CT chest done 10/5 revealed:  1. Multiple, too numerous to count pulmonary nodules some which are slightly cavitary in appearance, the dominant nodule in the right lower lobe measuring 1.5 x 1.6 x 1.6 cm. Referral to pulmonology or cardiothoracic surgery is suggested. 2. Mediastinal, bilateral hilar and  epigastric lymphadenopathy concerning for metastasis. 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 3. Chronic degenerative change of the midthoracic spine with chronic moderate compression deformity of T12. 4. Morphologic changes of cirrhosis  Request received for image guided cervical lymph node biopsy for further evaluation. Imaging has been reviewed by Dr. Pascal Lux. Risks and benefits discussed with the patient/sister(POA) including, but not limited to bleeding, infection, damage to adjacent structures or low yield requiring additional tests.  All of the patient's questions were answered, patient is agreeable to proceed. Consent signed and in chart.  Procedure scheduled for today   Thank you for this interesting consult.  I greatly enjoyed meeting MARY HOCKEY and look forward to participating in their care.  A copy of this report was sent to the requesting provider on this date.  Electronically Signed: D. Rowe Robert, PA-C 01/03/2018, 11:33 AM   I spent a total of 25 minutes  in face to face in clinical consultation, greater than 50% of which was counseling/coordinating care for US guided left cervical lymph node biopsy

## 2018-01-04 ENCOUNTER — Telehealth: Payer: Self-pay | Admitting: *Deleted

## 2018-01-04 NOTE — Telephone Encounter (Signed)
Left voicemail requesting call back.  

## 2018-01-05 ENCOUNTER — Telehealth: Payer: Self-pay | Admitting: Family Medicine

## 2018-01-05 NOTE — Telephone Encounter (Signed)
Attempted to call patient again, mailbox was full. Unable to leave voicemail.

## 2018-01-05 NOTE — Telephone Encounter (Signed)
Currently looking into alternatives, but the patient has a sample in the mean time.

## 2018-01-05 NOTE — Telephone Encounter (Signed)
Copied from [place] [id number]. Topic: General - Other >> Jan 05, 2018  2:22 PM Cecelia Byars, NT wrote: Reason for CRM:Dr Cammie Sickle at  [facility] called and  would like a call from Dr [name] or his nurse to [phone] as soon as possible

## 2018-01-05 NOTE — Telephone Encounter (Signed)
Called and spoke to Dr Cammie Sickle. She states that patient had a lymph node biopsy and it showed metastatic carcinoma.

## 2018-01-06 ENCOUNTER — Institutional Professional Consult (permissible substitution): Payer: Self-pay | Admitting: Pulmonary Disease

## 2018-01-06 ENCOUNTER — Other Ambulatory Visit: Payer: Self-pay

## 2018-01-06 ENCOUNTER — Telehealth: Payer: Self-pay | Admitting: *Deleted

## 2018-01-06 DIAGNOSIS — R918 Other nonspecific abnormal finding of lung field: Secondary | ICD-10-CM

## 2018-01-06 NOTE — Telephone Encounter (Signed)
Patient has an appointment scheduled with oncology.

## 2018-01-06 NOTE — Telephone Encounter (Signed)
Left a voicemail requesting call back on sister Michael Mcfarland phone.

## 2018-01-06 NOTE — Telephone Encounter (Signed)
Oncology Nurse Navigator Documentation  Oncology Nurse Navigator Flowsheets 01/06/2018  Navigator Location [facility]  Referral date to RadOnc/MedOnc 01/03/2018  Navigator Encounter Type Telephone/pathology results came back today. Notified of appt for [facility] 01/13/18 arrive at 3:30  Telephone Outgoing Call  Treatment Phase Pre-Tx/Tx Discussion  Barriers/Navigation Needs Coordination of Care  Interventions Coordination of Care  Coordination of Care Appts  Acuity Level 1  Time Spent with Patient 30

## 2018-01-06 NOTE — Telephone Encounter (Signed)
Results noted.  Please place a referral to oncology. Please reach out to patient to schedule hospital follow up visit.  Algis Greenhouse. [name], MD 01/06/2018 8:15 AM

## 2018-01-10 ENCOUNTER — Telehealth: Payer: Self-pay

## 2018-01-10 NOTE — Telephone Encounter (Signed)
Pt is still working on getting assistance coverage from [employer]. Will call him back in a few weeks to follow up.

## 2018-01-10 NOTE — Telephone Encounter (Signed)
Attempted to contact patient. Mailbox was full. Patient has appt tomorrow with Dr [name].

## 2018-01-10 NOTE — Telephone Encounter (Signed)
-----   Message from [place] sent at 11/10/2017  9:06 AM EDT ----- Needs Twinrix series. No benefits at time of office visit on 08-04-2017. Pt in process of getting health benefits. Set up a nurse shot visit.

## 2018-01-11 ENCOUNTER — Ambulatory Visit (INDEPENDENT_AMBULATORY_CARE_PROVIDER_SITE_OTHER): Payer: Self-pay | Admitting: Family Medicine

## 2018-01-11 ENCOUNTER — Encounter: Payer: Self-pay | Admitting: Family Medicine

## 2018-01-11 VITALS — BP 134/80 | HR 105 | Temp 97.4°F | Ht 69.0 in | Wt 226.6 lb

## 2018-01-11 DIAGNOSIS — L0390 Cellulitis, unspecified: Secondary | ICD-10-CM

## 2018-01-11 DIAGNOSIS — B372 Candidiasis of skin and nail: Secondary | ICD-10-CM

## 2018-01-11 DIAGNOSIS — G8929 Other chronic pain: Secondary | ICD-10-CM

## 2018-01-11 DIAGNOSIS — J439 Emphysema, unspecified: Secondary | ICD-10-CM

## 2018-01-11 DIAGNOSIS — C3490 Malignant neoplasm of unspecified part of unspecified bronchus or lung: Secondary | ICD-10-CM

## 2018-01-11 DIAGNOSIS — M545 Low back pain: Secondary | ICD-10-CM

## 2018-01-11 MED ORDER — DOXYCYCLINE HYCLATE 100 MG PO TABS: 100.0000 mg | ORAL_TABLET | Freq: Two times a day (BID) | ORAL | 0 refills | Status: AC

## 2018-01-11 MED ORDER — FLUCONAZOLE 150 MG PO TABS: 150.0000 mg | ORAL_TABLET | Freq: Once | ORAL | 0 refills | Status: AC

## 2018-01-11 MED ORDER — BACLOFEN 10 MG PO TABS: 10.0000 mg | ORAL_TABLET | Freq: Three times a day (TID) | ORAL | 0 refills | Status: AC

## 2018-01-11 NOTE — Assessment & Plan Note (Addendum)
Baclofen refilled.

## 2018-01-11 NOTE — Assessment & Plan Note (Signed)
Worsened.  We will give one-time dose of Diflucan.  Continue topical nystatin.

## 2018-01-11 NOTE — Assessment & Plan Note (Signed)
Respiratory status is stable.  Spiriva samples were given to patient today.

## 2018-01-11 NOTE — Progress Notes (Signed)
Subjective:  Michael Mcfarland is a 52 y.o. male who presents today for a TCM visit.  HPI:  Summary of Hospital admission: Reason for admission: Metabolic encephalopathy Date of admission: 01/01/2018 Date of discharge: 01/03/2018 Date of Interactive contact: 01/04/2018 Summary of Hospital course: Patient presented to the ED on 01/01/2018 with episodes of delirium.  Patient was admitted to the hospital.  He was continued on his home lactulose and his symptoms gradually improved.  While hospitalized, patient also underwent further work-up for his pulmonary nodules.  He underwent lymph node biopsy on the day of discharge.  Patient gradually improved back to his baseline and was discharged home.  Interim History:   Metabolic encephalopathy Patient is now back at his baseline.  He is compliant with his lactulose.  His sister is with him today and also reports that his mental status is back to his baseline.  Malignancy Patient underwent lymph node biopsy while hospitalized.  He has an oncology appointment scheduled for later this week.  He still has quite a bit of swelling to his left neck and has almost entirely lost his voice.  COPD Patient's breathing is back to baseline.  He does not currently have insurance and it is very difficult for him to afford the Spiriva.  He was given samples at his last office visit and has been compliant with these medications.  Is also been using the albuterol as needed.  Rash Patient was seen 2 to 3 months ago for candidal intertrigo with possible abscess formation.  However last week, patient has had a return of the symptoms.  Does not have any drainage to the area.  Has quite a bit of redness and swelling to his left groin.  No specific treatments tried.  Low back pain Chronic problem. Takes baclofen as needed.  Malignancy  ROS: Per HPI, otherwise a complete review of systems was negative.   PMH:  The following were reviewed and entered/updated in  epic: Past Medical History:  Diagnosis Date  . Alcohol abuse    /notes 01/18/2017  . Alcohol related seizure ([place]) 01/17/2017   Archie Endo 01/17/2017  . Anxiety   . Arthritis   . Cirrhosis, alcoholic ([place]) 40/0867  . COPD (chronic obstructive pulmonary disease) ([place])   . Depression   . Emphysema lung ([place])   . Frequent headaches   . GERD (gastroesophageal reflux disease)   . Hay fever   . Hepatitis B   . IBS (irritable bowel syndrome)   . Migraine    frequency "depends on the weather" (01/19/2017)  . Panic attacks   . Pneumonia   . Portal hypertensive gastropathy ([place])   . Seizure ([place]) 05/20/2017 X 2  . Stroke [facility])    Patient Active Problem List   Diagnosis Date Noted  . Non-small cell carcinoma of lung ([place]) 01/11/2018  . Secondary esophageal varices without bleeding ([place]) 01/02/2018  . Delirium 01/01/2018  . COPD with acute exacerbation ([place]) 01/01/2018  . Pulmonary nodules 12/28/2017  . Pulmonary emphysema ([place]) 12/28/2017  . Aortic atherosclerosis ([place]) 12/28/2017  . Candidal intertrigo 11/12/2017  . Nicotine dependence with current use 09/17/2017  . Contraindication to anticoagulation therapy 09/17/2017  . Eustachian tube dysfunction 08/30/2017  . Protein-calorie malnutrition ([place]) 07/29/2017  . Alcohol withdrawal syndrome with perceptual disturbance ([place]) 07/18/2017  . Benzodiazepine abuse, episodic ([place]) 07/18/2017  . SDH (subdural hematoma) ([place]) 07/16/2017  . Insomnia 07/08/2017  . Low back pain 07/08/2017  . Esophageal varices in alcoholic cirrhosis ([place])   .  Hepatic encephalopathy ([place]) 06/15/2017  . Anxiety 06/03/2017  . Seizure ([place])   . Cirrhosis ([place])   . Thrombocytopenia ([place])   . Alcohol withdrawal seizure ([place]) 01/17/2017   Past Surgical History:  Procedure Laterality Date  . ANTERIOR CERVICAL DECOMP/DISCECTOMY FUSION    . ESOPHAGOGASTRODUODENOSCOPY N/A 06/18/2017   Procedure: ESOPHAGOGASTRODUODENOSCOPY (EGD);  Surgeon: Doran Stabler, MD;   Location: [facility];  Service: Gastroenterology;  Laterality: N/A;  . LUMBAR DISC SURGERY      Family History  Problem Relation Age of Onset  . Diabetes Mother   . Depression Mother   . Hypertension Mother   . Arthritis Mother   . Arthritis Father   . Heart disease Father   . Liver disease Sister        fatty liver  . Heart disease Brother   . Heart attack Paternal Grandfather   . Diabetes Sister     Medications- Reconciled discharge and current medications in Epic.  Current Outpatient Medications  Medication Sig Dispense Refill  . albuterol (PROVENTIL HFA;VENTOLIN HFA) 108 (90 Base) MCG/ACT inhaler Inhale 2 puffs into the lungs every 6 (six) hours as needed for wheezing or shortness of breath. 1 Inhaler 0  . baclofen (LIORESAL) 10 MG tablet Take 1 tablet (10 mg total) by mouth 3 (three) times daily. 30 each 0  . Chlorpheniramine Maleate (ALLERGY PO) Take 1 tablet by mouth daily as needed (allergies).    . diclofenac (VOLTAREN) 50 MG EC tablet Take 1 tablet (50 mg total) by mouth 2 (two) times daily. 30 tablet 0  . feeding supplement, ENSURE ENLIVE, (ENSURE ENLIVE) LIQD Take 237 mLs by mouth 2 (two) times daily between meals. 814 mL 0  . folic acid (FOLVITE) 1 MG tablet Take 1 tablet (1 mg total) by mouth daily. 30 tablet 5  . ipratropium (ATROVENT) 0.06 % nasal spray Place 2 sprays into both nostrils 4 (four) times daily. 15 mL 0  . lactulose (CHRONULAC) 10 GM/15ML solution Take 4mL one to three times daily to titrate for 2-3 soft stools per day 946 mL 5  . Multiple Vitamin (MULTIVITAMIN WITH MINERALS) TABS tablet Take 1 tablet by mouth daily. 30 tablet 5  . nadolol (CORGARD) 20 MG tablet Take 1 tablet (20 mg total) by mouth daily. 30 tablet 3  . nortriptyline (PAMELOR) 75 MG capsule Take 2 capsules (150 mg total) by mouth at bedtime. 180 capsule 1  . nystatin (MYCOSTATIN/NYSTOP) powder Apply topically 4 (four) times daily. 60 g 0  . pantoprazole (PROTONIX) 40 MG tablet Take 1  tablet (40 mg total) by mouth daily at 12 noon. 90 tablet 3  . thiamine 100 MG tablet Take 1 tablet (100 mg total) by mouth daily.    . Tiotropium Bromide Monohydrate (SPIRIVA RESPIMAT) 2.5 MCG/ACT AERS Inhale 2 puffs into the lungs daily. 4 g 3  . doxycycline (VIBRA-TABS) 100 MG tablet Take 1 tablet (100 mg total) by mouth 2 (two) times daily. 14 tablet 0  . fluconazole (DIFLUCAN) 150 MG tablet Take 1 tablet (150 mg total) by mouth once for 1 dose. 1 tablet 0   No current facility-administered medications for this visit.     Allergies-reviewed and updated No Known Allergies  Social History   Socioeconomic History  . Marital status: Widowed    Spouse name: Not on file  . Number of children: 1  . Years of education: Not on file  . Highest education level: Not on file  Occupational History  .  Occupation: Dealer    Comment: self employed but stopped working ~ 06-20-2009 following death of wife and after having neck surgery.    Social Needs  . Financial resource strain: Not on file  . Food insecurity:    Worry: Not on file    Inability: Not on file  . Transportation needs:    Medical: Not on file    Non-medical: Not on file  Tobacco Use  . Smoking status: Current Some Day Smoker    Packs/day: 0.25    Years: 35.00    Pack years: 8.75    Types: Cigarettes  . Smokeless tobacco: Never Used  . Tobacco comment: quit smoking after admission 04/2017  Substance and Sexual Activity  . Alcohol use: Not Currently    Alcohol/week: 18.0 standard drinks    Types: 18 Cans of beer per week    Comment: until admission 04/2017 drinking 12 to 18 beers daily.  since then, no beer or other ETOH (as of 06/17/17)  . Drug use: Not Currently    Comment: 05/20/2017 "been a long time; I tried different things when I was younger"  . Sexual activity: Not Currently    Comment: widowed in 06/20/09  Lifestyle  . Physical activity:    Days per week: Not on file    Minutes per session: Not on file  . Stress: Not on  file  Relationships  . Social connections:    Talks on phone: Not on file    Gets together: Not on file    Attends religious service: Not on file    Active member of club or organization: Not on file    Attends meetings of clubs or organizations: Not on file    Relationship status: Not on file  Other Topics Concern  . Not on file  Social History Narrative  . Not on file    Objective:  Physical Exam: BP 134/80 (BP Location: Left Arm, Patient Position: Sitting, Cuff Size: Normal)   Pulse (!) 105   Temp (!) 97.4 F (36.3 C) (Oral)   Ht 5\' 9"  (1.753 m)   Wt 226 lb 9.6 oz (102.8 kg)   SpO2 94%   BMI 33.46 kg/m   Gen: NAD, resting comfortably HEENT: Left neck with significant adenopathy and ecchymosis. CV: RRR with no murmurs appreciated Pulm: NWOB, CTAB with no crackles, wheezes, or rhonchi GI: Normal bowel sounds present. Soft, Nontender, Nondistended. MSK: No edema, cyanosis, or clubbing noted Skin: Warm, dry.  Left groin with significant amount of intertrigo.  Small indurated area without obvious fluctuance.  Small amount spreading erythema. Neuro: Grossly normal, moves all extremities Psych: Normal affect and thought content  Assessment/Plan:  Pulmonary emphysema (HCC) Respiratory status is stable.  Spiriva samples were given to patient today.  Non-small cell carcinoma of lung [facility]) Discussed pathology report with patient.  Discussed findings of non-small cell carcinoma.  He has oncology follow-up later this week.  He is also currently looking into getting disability.  Low back pain Baclofen refilled.  Candidal intertrigo Worsened.  We will give one-time dose of Diflucan.  Continue topical nystatin.  Cellulitis No signs of systemic infection.  Start course of doxycycline.  Also treat his candidal intertrigo as noted above.  Algis Greenhouse. [name], MD 01/11/2018 4:20 PM

## 2018-01-11 NOTE — Assessment & Plan Note (Signed)
Discussed pathology report with patient.  Discussed findings of non-small cell carcinoma.  He has oncology follow-up later this week.  He is also currently looking into getting disability.

## 2018-01-11 NOTE — Patient Instructions (Addendum)
It was very nice to see you today!  I will send in more baclofen for you.  Please also start the doxycycline.  Take the dose of diflucan.   Come back to see me in 3-6 months, or sooner as needed.  Take care, Dr [name]

## 2018-01-13 ENCOUNTER — Encounter: Payer: Self-pay | Admitting: *Deleted

## 2018-01-13 ENCOUNTER — Telehealth: Payer: Self-pay | Admitting: Internal Medicine

## 2018-01-13 ENCOUNTER — Inpatient Hospital Stay: Payer: Self-pay | Attending: Internal Medicine | Admitting: Internal Medicine

## 2018-01-13 ENCOUNTER — Encounter: Payer: Self-pay | Admitting: Internal Medicine

## 2018-01-13 ENCOUNTER — Inpatient Hospital Stay: Payer: Self-pay

## 2018-01-13 VITALS — BP 145/79 | HR 85 | Temp 98.4°F | Resp 18 | Ht 69.0 in | Wt 227.0 lb

## 2018-01-13 DIAGNOSIS — C3431 Malignant neoplasm of lower lobe, right bronchus or lung: Secondary | ICD-10-CM

## 2018-01-13 DIAGNOSIS — Z7189 Other specified counseling: Secondary | ICD-10-CM

## 2018-01-13 DIAGNOSIS — R590 Localized enlarged lymph nodes: Secondary | ICD-10-CM | POA: Insufficient documentation

## 2018-01-13 DIAGNOSIS — Z87891 Personal history of nicotine dependence: Secondary | ICD-10-CM | POA: Insufficient documentation

## 2018-01-13 DIAGNOSIS — R1310 Dysphagia, unspecified: Secondary | ICD-10-CM | POA: Insufficient documentation

## 2018-01-13 DIAGNOSIS — Z79899 Other long term (current) drug therapy: Secondary | ICD-10-CM | POA: Insufficient documentation

## 2018-01-13 DIAGNOSIS — C3491 Malignant neoplasm of unspecified part of right bronchus or lung: Secondary | ICD-10-CM | POA: Insufficient documentation

## 2018-01-13 DIAGNOSIS — F1010 Alcohol abuse, uncomplicated: Secondary | ICD-10-CM | POA: Insufficient documentation

## 2018-01-13 DIAGNOSIS — Z5111 Encounter for antineoplastic chemotherapy: Secondary | ICD-10-CM

## 2018-01-13 DIAGNOSIS — C3490 Malignant neoplasm of unspecified part of unspecified bronchus or lung: Secondary | ICD-10-CM | POA: Insufficient documentation

## 2018-01-13 DIAGNOSIS — F17210 Nicotine dependence, cigarettes, uncomplicated: Secondary | ICD-10-CM

## 2018-01-13 NOTE — Telephone Encounter (Signed)
Scheduled appt per 10/17 los - gave patient aVS and calender per los.

## 2018-01-13 NOTE — Progress Notes (Signed)
[place] [phone]   [phone] Multidisciplinary thoracic oncology clinic  CONSULT NOTE  REFERRING PHYSICIAN: Dr. Dimas Chyle  REASON FOR CONSULTATION:  52 years old white male recently diagnosed with lung cancer.  HPI Michael Mcfarland is a 52 y.o. male with past medical history significant for alcohol abuse, and anxiety, COPD, GERD, irritable bowel syndrome, migraine headache, depression, seizure activity as well as a stroke and hepatitis B.  The patient presented to the emergency department on January 01, 2018 complaining of low back pain and shortness of breath for 2 months.  He also had altered mental status.  CT scan of the head without contrast showed no acute intracranial abnormalities.  Chest x-ray performed at that time showed hazy bilateral pulmonary opacities most consistent with pulmonary edema.  CT scan of the chest on January 01, 2018 showed multiple too numerous to count pulmonary nodules some are slightly cavitary in appearance, the dominant nodule in the right lower lobe measured 1.5 x 1.6 x 1.6 cm.  There was also mediastinal, bilateral hilar and epigastric lymphadenopathy concerning for metastasis.  On January 03, 2018 the patient underwent ultrasound-guided core biopsy of the left lateral cervical lymph node by interventional radiology. The final pathology ([id number]) showed poorly differentiated non-small cell carcinoma and by immunohistochemistry squamous cell carcinoma was favored.  PDL 1 expression is a still pending. The patient was referred to the multidisciplinary thoracic oncology clinic for further evaluation and recommendation regarding his condition. When seen today the patient complains of chronic back pain as well as cough with mild shortness of breath but no significant chest pain or hemoptysis.  He denied having any nausea, vomiting, diarrhea or constipation.  He has no current headache or visual changes.  He has no weight  loss or night sweats. Family history significant for mother with heart disease and kidney problems.  Father had congestive heart failure and lung cancer.  Sister had fatty liver and died last year and another sister with type 1 diabetes mellitus. The patient is a widow and has 1 son.  He was accompanied by his sister [name].  He is to work as a Dealer.  The patient has a history for smoking up to 2 pack/day for around 25 years and unfortunately continues to smoke.  He also drinks 6 to 12 pack of beer on daily basis more on the weekend.  No history of drug abuse.  HPI  Past Medical History:  Diagnosis Date  . Alcohol abuse    /notes 01/18/2017  . Alcohol related seizure ([place]) 01/17/2017   Archie Endo 01/17/2017  . Anxiety   . Arthritis   . Cirrhosis, alcoholic ([place]) [id number]  . COPD (chronic obstructive pulmonary disease) ([place])   . Depression   . Emphysema lung ([place])   . Frequent headaches   . GERD (gastroesophageal reflux disease)   . Hay fever   . Hepatitis B   . IBS (irritable bowel syndrome)   . Migraine    frequency "depends on the weather" (01/19/2017)  . Panic attacks   . Pneumonia   . Portal hypertensive gastropathy ([place])   . Seizure ([place]) 05/20/2017 X 2  . Stroke [facility])     Past Surgical History:  Procedure Laterality Date  . ANTERIOR CERVICAL DECOMP/DISCECTOMY FUSION    . ESOPHAGOGASTRODUODENOSCOPY N/A 06/18/2017   Procedure: ESOPHAGOGASTRODUODENOSCOPY (EGD);  Surgeon: Doran Stabler, MD;  Location: [facility];  Service: Gastroenterology;  Laterality: N/A;  . [place]  SURGERY      Family History  Problem Relation Age of Onset  . Diabetes Mother   . Depression Mother   . Hypertension Mother   . Arthritis Mother   . Arthritis Father   . Heart disease Father   . Liver disease Sister        fatty liver  . Heart disease Brother   . Heart attack Paternal Grandfather   . Diabetes Sister     Social History Social History   Tobacco Use  . Smoking  status: Current Some Day Smoker    Packs/day: 0.25    Years: 35.00    Pack years: 8.75    Types: Cigarettes  . Smokeless tobacco: Never Used  . Tobacco comment: quit smoking after admission 04/2017  Substance Use Topics  . Alcohol use: Not Currently    Alcohol/week: 18.0 standard drinks    Types: 18 Cans of beer per week    Comment: until admission 04/2017 drinking 12 to 18 beers daily.  since then, no beer or other ETOH (as of 06/17/17)  . Drug use: Not Currently    Comment: 05/20/2017 "been a long time; I tried different things when I was younger"    No Known Allergies  Current Outpatient Medications  Medication Sig Dispense Refill  . albuterol (PROVENTIL HFA;VENTOLIN HFA) 108 (90 Base) MCG/ACT inhaler Inhale 2 puffs into the lungs every 6 (six) hours as needed for wheezing or shortness of breath. 1 Inhaler 0  . baclofen (LIORESAL) 10 MG tablet Take 1 tablet (10 mg total) by mouth 3 (three) times daily. 30 each 0  . Chlorpheniramine Maleate (ALLERGY PO) Take 1 tablet by mouth daily as needed (allergies).    . diclofenac (VOLTAREN) 50 MG EC tablet Take 1 tablet (50 mg total) by mouth 2 (two) times daily. 30 tablet 0  . doxycycline (VIBRA-TABS) 100 MG tablet Take 1 tablet (100 mg total) by mouth 2 (two) times daily. 14 tablet 0  . feeding supplement, ENSURE ENLIVE, (ENSURE ENLIVE) LIQD Take 237 mLs by mouth 2 (two) times daily between meals. 161 mL 0  . folic acid (FOLVITE) 1 MG tablet Take 1 tablet (1 mg total) by mouth daily. 30 tablet 5  . ipratropium (ATROVENT) 0.06 % nasal spray Place 2 sprays into both nostrils 4 (four) times daily. 15 mL 0  . lactulose (CHRONULAC) 10 GM/15ML solution Take 63mL one to three times daily to titrate for 2-3 soft stools per day 946 mL 5  . Multiple Vitamin (MULTIVITAMIN WITH MINERALS) TABS tablet Take 1 tablet by mouth daily. 30 tablet 5  . nadolol (CORGARD) 20 MG tablet Take 1 tablet (20 mg total) by mouth daily. 30 tablet 3  . nortriptyline (PAMELOR)  75 MG capsule Take 2 capsules (150 mg total) by mouth at bedtime. 180 capsule 1  . nystatin (MYCOSTATIN/NYSTOP) powder Apply topically 4 (four) times daily. 60 g 0  . pantoprazole (PROTONIX) 40 MG tablet Take 1 tablet (40 mg total) by mouth daily at 12 noon. 90 tablet 3  . thiamine 100 MG tablet Take 1 tablet (100 mg total) by mouth daily.    . Tiotropium Bromide Monohydrate (SPIRIVA RESPIMAT) 2.5 MCG/ACT AERS Inhale 2 puffs into the lungs daily. 4 g 3   No current facility-administered medications for this visit.     Review of Systems  Constitutional: positive for fatigue Eyes: negative Ears, nose, mouth, throat, and face: positive for hoarseness Respiratory: positive for cough and dyspnea on exertion Cardiovascular: negative  Gastrointestinal: negative Genitourinary:negative Integument/breast: negative Hematologic/lymphatic: negative Musculoskeletal:positive for back pain Neurological: negative Behavioral/Psych: negative Endocrine: negative Allergic/Immunologic: negative  Physical Exam  OZH:YQMVH, healthy, no distress, well nourished, well developed and anxious SKIN: skin color, texture, turgor are normal, no rashes or significant lesions HEAD: Normocephalic, No masses, lesions, tenderness or abnormalities EYES: normal, PERRLA, Conjunctiva are pink and non-injected EARS: External ears normal, Canals clear OROPHARYNX:no exudate, no erythema and lips, buccal mucosa, and tongue normal  NECK: Large left supraclavicular lymphadenopathy LYMPH:  Large left supraclavicular lymphadenopathy LUNGS: clear to auscultation , and palpation HEART: regular rate & rhythm, no murmurs and no gallops ABDOMEN:abdomen soft, non-tender, normal bowel sounds and no masses or organomegaly BACK: Back symmetric, no curvature., No CVA tenderness EXTREMITIES:no joint deformities, effusion, or inflammation, no edema  NEURO: alert & oriented x 3 with fluent speech, no focal motor/sensory  deficits  PERFORMANCE STATUS: ECOG 1  LABORATORY DATA: Lab Results  Component Value Date   WBC 11.9 (H) 01/03/2018   HGB 16.3 01/03/2018   HCT 48.4 01/03/2018   MCV 100.0 01/03/2018   PLT 125 (L) 01/03/2018      Chemistry      Component Value Date/Time   NA 141 01/03/2018 0516   K 3.7 01/03/2018 0516   CL 105 01/03/2018 0516   CO2 25 01/03/2018 0516   BUN 18 01/03/2018 0516   CREATININE 0.82 01/03/2018 0516   CREATININE 0.88 02/10/2017 1400      Component Value Date/Time   CALCIUM 10.1 01/03/2018 0516   ALKPHOS 151 (H) 01/01/2018 1509   AST 59 (H) 01/01/2018 1509   ALT 21 01/01/2018 1509   BILITOT 2.4 (H) 01/01/2018 1509       RADIOGRAPHIC STUDIES: Dg Chest 2 View  Result Date: 01/01/2018 CLINICAL DATA:  Low back pain and shortness of breath for 2 months. EXAM: CHEST - 2 VIEW COMPARISON:  PA and lateral chest 06/15/2017, 05/20/2017 and 01/17/2017. FINDINGS: Hazy bilateral pulmonary opacities are identified. No pneumothorax or pleural effusion. Heart size is enlarged. No acute or focal bony abnormality. IMPRESSION: Hazy bilateral pulmonary opacities have an appearance most consistent with pulmonary edema. Electronically Signed   By: Inge Rise M.D.   On: 01/01/2018 16:33   Dg Lumbar Spine Complete  Result Date: 01/01/2018 CLINICAL DATA:  Low back pain EXAM: LUMBAR SPINE - COMPLETE 4+ VIEW COMPARISON:  07/31/2008 FINDINGS: Degenerative spurring anteriorly. Slight disc space narrowing at L5-S1. Moderate compression deformity at T12, age indeterminate but new since 2010. No subluxation. SI joints are symmetric and unremarkable. IMPRESSION: Moderate compression deformity at T12, age indeterminate but new since 2010. Electronically Signed   By: Rolm Baptise M.D.   On: 01/01/2018 15:40   Ct Head Wo Contrast  Result Date: 01/01/2018 CLINICAL DATA:  Altered mental status EXAM: CT HEAD WITHOUT CONTRAST TECHNIQUE: Contiguous axial images were obtained from the base of the  skull through the vertex without intravenous contrast. COMPARISON:  07/17/2017 FINDINGS: Brain: No acute intracranial abnormality. Specifically, no hemorrhage, hydrocephalus, mass lesion, acute infarction, or significant intracranial injury. Vascular: No hyperdense vessel or unexpected calcification. Skull: No acute calvarial abnormality. Sinuses/Orbits: Visualized paranasal sinuses and mastoids clear. Orbital soft tissues unremarkable. Other: None IMPRESSION: No acute intracranial abnormality. Electronically Signed   By: Rolm Baptise M.D.   On: 01/01/2018 15:41   Ct Soft Tissue Neck W Contrast  Result Date: 12/24/2017 CLINICAL DATA:  Lymphadenopathy. Left-sided neck knot for a couple of months. Hoarseness for 2 weeks. History of smoking and cervical spine  fusion. EXAM: CT NECK WITH CONTRAST TECHNIQUE: Multidetector CT imaging of the neck was performed using the standard protocol following the bolus administration of intravenous contrast. CONTRAST:  34mL ISOVUE-300 IOPAMIDOL (ISOVUE-300) INJECTION 61% COMPARISON:  Cervical spine CT 05/20/2017 FINDINGS: Pharynx and larynx: Slight asymmetric fullness of the left posterolateral oropharyngeal soft tissues without differential enhancing mass evident. Prominent asymmetric enlargement of the left laryngeal ventricle. Slight asymmetric enlargement of the left piriform sinus. Salivary glands: Subcentimeter soft tissue nodules in the parotid glands, likely intraparotid lymph nodes. Fatty infiltration of the submandibular glands. No evidence of acute inflammation. Thyroid: Unremarkable. Lymph nodes: Low-density bilateral supraclavicular lymphadenopathy, bulky on the left and extending superiorly into level III. The largest individual node measures 2.5 cm in short axis in the left level IV/supraclavicular region. Right supraclavicular nodes measure up to 1.3 cm in short axis. Vascular: Mild-to-moderate calcified atherosclerosis about the right greater than left carotid  bifurcations. Aortic arch atherosclerosis. Limited intracranial: Unremarkable. Mastoids and visualized paranasal sinuses: Mild bilateral maxillary sinus mucosal thickening. Clear mastoid air cells. Skeleton: Fixation wires at the angle of the mandible on the left. Dental caries and periapical lucencies associated with maxillary and mandibular molar teeth. Solid C5-C7 ACDF. No suspicious osseous lesion. Upper chest: Partially visualized low-density mediastinal lymphadenopathy including 2.2 cm short axis right paratracheal lymph node and 2.4 cm prevascular lymph node. Centrilobular emphysema. Small right upper lobe lung nodules measuring up to 5 mm in size. Other: None. IMPRESSION: 1. Left greater than right cervical and mediastinal lymphadenopathy consistent with metastatic disease. Chest CT is recommended to evaluate for a primary lung malignancy. 2. Findings suggesting left vocal cord paresis. 3. Small right upper lobe lung nodules measuring up to 5 mm in size. 4. Aortic Atherosclerosis (ICD10-I70.0) and Emphysema (ICD10-J43.9). Electronically Signed   By: Logan Bores M.D.   On: 12/24/2017 15:39   Ct Chest W Contrast  Result Date: 01/01/2018 CLINICAL DATA:  Dyspnea and difficulty swallowing. EXAM: CT CHEST WITH CONTRAST TECHNIQUE: Multidetector CT imaging of the chest was performed during intravenous contrast administration. CONTRAST:  84mL OMNIPAQUE IOHEXOL 300 MG/ML  SOLN COMPARISON:  12/24/2017 neck CT FINDINGS: Cardiovascular: Atherosclerosis at the origins of the great vessels. Nonaneurysmal thoracic aorta with atherosclerosis. Moderate degree of three-vessel coronary arteriosclerosis. Heart size is top normal without pericardial effusion. No large central pulmonary embolus is identified though the study is not tailored toward assessment of pulmonary emboli. Mediastinum/Nodes: Extensive mediastinal and bilateral hilar lymphadenopathy with representative lymph nodes as follows: 1. Right paratracheal 2.4 cm  short axis lymph node, series 2/48 2. Left paratracheal 2.1 cm short axis lymph node, series 2/62 3. AP window lymph node measuring 2.3 cm short axis, series 2/58 4. Right tracheobronchial 2.5 cm short axis lymph node, series 2/68 5. Left hilar lymph node measuring 2.7 cm short axis, series 2/77 6. Subcarinal 2.1 cm short axis lymph node, series 2/75 Other additional smaller prevascular and left lower paratracheal lymph nodes are present. Lungs/Pleura: Too numerous to count pulmonary nodules some of which demonstrate cavitation are identified scattered throughout both lungs. The dominant nodule is seen in the posterior basal segment of the right lower lobe measuring 1.5 x 1.6 x 1.6 cm. Subpleural wedge-shaped consolidation in the lingula likely reflects an area of atelectatic lung. No pleural effusion or pneumothorax. Upper Abdomen: Morphologic changes of cirrhosis with surface nodularity of the liver noted. No definite adrenal mass. Small epigastric nodules may reflect adenopathy. Splenules are present about the spleen. Musculoskeletal: No aggressive osseous lesions. Chronic degenerative change  along the thoracic and upper lumbar spine with chronic compression deformity of T12. ACDF of the included lower cervical spine. IMPRESSION: 1. Multiple, too numerous to count pulmonary nodules some which are slightly cavitary in appearance, the dominant nodule in the right lower lobe measuring 1.5 x 1.6 x 1.6 cm. Referral to pulmonology or cardiothoracic surgery is suggested. 2. Mediastinal, bilateral hilar and epigastric lymphadenopathy concerning for metastasis. 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 3. Chronic degenerative change of the midthoracic spine with chronic moderate compression deformity of T12. 4. Morphologic changes of cirrhosis. Aortic Atherosclerosis (ICD10-I70.0). Electronically Signed   By: Ashley Royalty M.D.   On: 01/01/2018 19:12   Dg  Swallowing Func-speech Pathology  Result Date: 01/11/2018 Please refer to "Notes" tab for Speech Pathology notes.  [place] Core Biopsy (lymph Nodes)  Result Date: 01/03/2018 INDICATION: No known primary, now with multiple pulmonary nodules, mediastinal, hilar and cervical lymphadenopathy. Please from ultrasound-guided left cervical lymph node biopsy for tissue diagnostic purposes. EXAM: ULTRASOUND-GUIDED BIOPSY DOMINANT LEFT LATERAL CERVICAL LYMPH NODE COMPARISON:  Chest CT - 01/01/2018; neck CT - 12/24/2017 MEDICATIONS: None ANESTHESIA/SEDATION: Moderate (conscious) sedation was employed during this procedure. A total of Versed 2 mg and Fentanyl 100 mcg was administered intravenously. Moderate Sedation Time: 12 minutes. The patient's level of consciousness and vital signs were monitored continuously by radiology nursing throughout the procedure under my direct supervision. COMPLICATIONS: None immediate. TECHNIQUE: Informed written consent was obtained from the patient after a discussion of the risks, benefits and alternatives to treatment. Questions regarding the procedure were encouraged and answered. Initial ultrasound scanning demonstrated multiple pathologically enlarged left cervical lymph nodes. A dominant left lateral cervical lymph node measuring approximately 3.1 x 1.5 cm (image 10) correlating with the lymph node seen on preceding contrast-enhanced neck CT image 65, series 3) was targeted for biopsy given location and sonographic window. An ultrasound image was saved for documentation purposes. The procedure was planned. A timeout was performed prior to the initiation of the procedure. The operative was prepped and draped in the usual sterile fashion, and a sterile drape was applied covering the operative field. A timeout was performed prior to the initiation of the procedure. Local anesthesia was provided with 1% lidocaine with epinephrine. Under direct ultrasound guidance, an 18 gauge core needle  device was utilized to obtain to obtain 6 core needle biopsies of the dominant left cervical lymph node. The samples were placed in saline and submitted to pathology. The needle was removed and hemostasis was achieved with manual compression. Post procedure scan was negative for significant hematoma. A dressing was placed. The patient tolerated the procedure well without immediate postprocedural complication. IMPRESSION: Technically successful ultrasound guided biopsy of dominant left lateral cervical lymph node. Electronically Signed   By: Sandi Mariscal M.D.   On: 01/03/2018 15:03    ASSESSMENT: This is a very pleasant 52 years old white male with history of alcohol and tobacco abuse who was recently diagnosed with stage IV (T1 a, N3, M1a) non-small cell lung cancer, squamous cell carcinoma presented with right lower lobe lung nodule in addition to bilateral mediastinal as well as left supraclavicular lymphadenopathy and multiple bilateral pulmonary nodules diagnosed in October 2019.   PLAN: I had a lengthy discussion with the patient and his sister today about his current disease stage, prognosis and treatment options. I personally and independently reviewed his scan images and discussed the result and showed the images to the patient and his sister.  I recommended for the patient to complete the staging work-up of his disease by ordering a PET scan as well as MRI of the brain. I also asked the pathologist to send his tissue block for PDL 1 expression. I had a lengthy discussion with the patient and his sister regarding his treatment options including palliative care and hospice referral versus consideration of palliative systemic chemotherapy with carboplatin, paclitaxel and Keytruda versus enrollment in the medical clinical trial with the patient would be randomized to treatment with Keytruda versus Keytruda plus lenvatinib if his PDL 1 expression is over 1% and the patient is eligible for the trial. The  patient is interested in some form of the treatment. I will see him back for follow-up visit in 2 weeks for more detailed discussion of his treatment options after completing the staging work-up as well as the PDL 1 expression. For smoking and alcohol abuse, I strongly encouraged the patient to quit smoking and alcohol drinking and he understand that systemic therapy would be significantly toxic if he continues with his current addiction. The patient was seen during the multidisciplinary thoracic oncology clinic today by medical oncology, thoracic navigator and social worker. The patient was advised to call immediately if he has any concerning symptoms in the interval. The patient voices understanding of current disease status and treatment options and is in agreement with the current care plan.  All questions were answered. The patient knows to call the clinic with any problems, questions or concerns. We can certainly see the patient much sooner if necessary.  Thank you so much for allowing me to participate in the care of Ulice Dash. I will continue to follow up the patient with you and assist in his care.  I spent 40 minutes counseling the patient face to face. The total time spent in the appointment was 60 minutes.  Disclaimer: This note was dictated with voice recognition software. Similar sounding words can inadvertently be transcribed and may not be corrected upon review.   Eilleen Kempf January 13, 2018, 3:45 PM

## 2018-01-13 NOTE — Progress Notes (Signed)
Oncology Nurse Navigator Documentation  Oncology Nurse Navigator Flowsheets 01/13/2018  Navigator Location [facility]  Navigator Encounter Type Clinic/MDC/I spoke with patient and sister today at thoracic clinic. I gave and explained information on lung cancer, treatment, side effects, and resources at the cancer center.   Per Dr. Julien Nordmann, I requested PDL 1 testing on biopsy on 01/03/18.    Abnormal Finding Date 01/01/2018  Confirmed Diagnosis Date 01/03/2018  Multidisiplinary Clinic Date 01/13/2018  Patient Visit Type MedOnc  Treatment Phase Pre-Tx/Tx Discussion  Barriers/Navigation Needs Education;Coordination of Care  Education Understanding Cancer/ Treatment Options;Newly Diagnosed Cancer Education;Other  Interventions Coordination of Care;Education  Coordination of Care Other  Education Method Verbal;Written  Acuity Level 2  Time Spent with Patient 30

## 2018-01-13 NOTE — Progress Notes (Signed)
[facility] Clinical Social Work  Clinical Social Work met with patient/family at [facility] appointment to offer support and assess for psychosocial needs. Patient was accompanied by his sister.  Patient was eager to leave after visit, did not share any emotional concerns.   Patient shared his typical methods of coping with anxiety including consumption of alcohol and smoking.  CSW encouraged patient to follow up with CSW to determine healthy ways of coping with illness.  CSW explored practical needs- patient reported he does not receive income and has lack of resources. Patient currently enrolled with [facility] for disability assistance.  Patient possibly in need of transportation- currently his sister provide rides to all appointments. Clinical Social Work briefly discussed Clinical Social Work role and [facility] support programs/services.  Clinical Social Work encouraged patient to call with any additional questions or concerns.  ONCBCN DISTRESS SCREENING 01/13/2018  Screening Type Initial Screening  Distress experienced in past week (1-10) 9  Emotional problem type Depression;Nervousness/Anxiety  Physical Problem type Nausea/vomiting;Loss of appetitie     [place] , MSW, LCSW, OSW-C Clinical Social Worker [facility] [phone]  

## 2018-01-20 ENCOUNTER — Other Ambulatory Visit: Payer: Self-pay | Admitting: Family Medicine

## 2018-01-21 ENCOUNTER — Ambulatory Visit (HOSPITAL_COMMUNITY)
Admission: RE | Admit: 2018-01-21 | Discharge: 2018-01-21 | Disposition: A | Discharge status: Home / Self Care | Payer: Self-pay | Source: Ambulatory Visit | Attending: Internal Medicine | Admitting: Internal Medicine

## 2018-01-21 ENCOUNTER — Encounter (HOSPITAL_COMMUNITY)
Admission: RE | Admit: 2018-01-21 | Discharge: 2018-01-21 | Disposition: A | Discharge status: Home / Self Care | Payer: Self-pay | Source: Ambulatory Visit | Attending: Internal Medicine | Admitting: Internal Medicine

## 2018-01-21 DIAGNOSIS — C3490 Malignant neoplasm of unspecified part of unspecified bronchus or lung: Secondary | ICD-10-CM

## 2018-01-21 DIAGNOSIS — J383 Other diseases of vocal cords: Secondary | ICD-10-CM | POA: Insufficient documentation

## 2018-01-21 DIAGNOSIS — G9389 Other specified disorders of brain: Secondary | ICD-10-CM | POA: Insufficient documentation

## 2018-01-21 DIAGNOSIS — R590 Localized enlarged lymph nodes: Secondary | ICD-10-CM | POA: Insufficient documentation

## 2018-01-21 LAB — GLUCOSE, CAPILLARY: GLUCOSE-CAPILLARY: 77 mg/dL (ref 70–99)

## 2018-01-21 MED ORDER — FLUDEOXYGLUCOSE F - 18 (FDG) INJECTION
11.3700 | Freq: Once | INTRAVENOUS | Status: AC | PRN
  Administered 2018-01-21: 11.37 via INTRAVENOUS

## 2018-01-21 MED ORDER — GADOBUTROL 1 MMOL/ML IV SOLN
10.0000 mL | Freq: Once | INTRAVENOUS | Status: AC | PRN
  Administered 2018-01-21: 10 mL via INTRAVENOUS

## 2018-01-27 ENCOUNTER — Encounter: Payer: Self-pay | Admitting: Internal Medicine

## 2018-01-27 ENCOUNTER — Inpatient Hospital Stay (HOSPITAL_BASED_OUTPATIENT_CLINIC_OR_DEPARTMENT_OTHER): Payer: Self-pay | Admitting: Internal Medicine

## 2018-01-27 ENCOUNTER — Inpatient Hospital Stay: Payer: Self-pay

## 2018-01-27 ENCOUNTER — Telehealth: Payer: Self-pay | Admitting: Family Medicine

## 2018-01-27 VITALS — BP 124/67 | HR 96 | Temp 98.3°F | Resp 22 | Ht 69.0 in | Wt 217.5 lb

## 2018-01-27 DIAGNOSIS — R590 Localized enlarged lymph nodes: Secondary | ICD-10-CM

## 2018-01-27 DIAGNOSIS — Z7189 Other specified counseling: Secondary | ICD-10-CM

## 2018-01-27 DIAGNOSIS — F10232 Alcohol dependence with withdrawal with perceptual disturbance: Secondary | ICD-10-CM

## 2018-01-27 DIAGNOSIS — Z79899 Other long term (current) drug therapy: Secondary | ICD-10-CM

## 2018-01-27 DIAGNOSIS — R1310 Dysphagia, unspecified: Secondary | ICD-10-CM

## 2018-01-27 DIAGNOSIS — C3490 Malignant neoplasm of unspecified part of unspecified bronchus or lung: Secondary | ICD-10-CM

## 2018-01-27 DIAGNOSIS — K7030 Alcoholic cirrhosis of liver without ascites: Secondary | ICD-10-CM

## 2018-01-27 DIAGNOSIS — F1010 Alcohol abuse, uncomplicated: Secondary | ICD-10-CM

## 2018-01-27 DIAGNOSIS — F10932 Alcohol use, unspecified with withdrawal with perceptual disturbance: Secondary | ICD-10-CM

## 2018-01-27 DIAGNOSIS — Z5111 Encounter for antineoplastic chemotherapy: Secondary | ICD-10-CM

## 2018-01-27 DIAGNOSIS — Z87891 Personal history of nicotine dependence: Secondary | ICD-10-CM

## 2018-01-27 LAB — CMP (CANCER CENTER ONLY)
ALBUMIN: 3 g/dL — AB (ref 3.5–5.0)
ALK PHOS: 180 U/L — AB (ref 38–126)
ALT: 17 U/L (ref 0–44)
AST: 52 U/L — AB (ref 15–41)
Anion gap: 10 (ref 5–15)
BUN: 13 mg/dL (ref 6–20)
CALCIUM: 11.1 mg/dL — AB (ref 8.9–10.3)
CO2: 27 mmol/L (ref 22–32)
Chloride: 100 mmol/L (ref 98–111)
Creatinine: 0.87 mg/dL (ref 0.61–1.24)
GFR, Est AFR Am: >60 mL/min (ref >60)
GFR, Estimated: >60 mL/min (ref >60)
GLUCOSE: 128 mg/dL — AB (ref 70–99)
Potassium: 3.9 mmol/L (ref 3.5–5.1)
Sodium: 137 mmol/L (ref 135–145)
Total Bilirubin: 4.2 mg/dL (ref 0.3–1.2)
Total Protein: 7.2 g/dL (ref 6.5–8.1)

## 2018-01-27 LAB — CBC WITH DIFFERENTIAL (CANCER CENTER ONLY)
ABS IMMATURE GRANULOCYTES: 0.08 10*3/uL — AB (ref 0.00–0.07)
BASOS ABS: 0 10*3/uL (ref 0.0–0.1)
BASOS PCT: 0 %
EOS ABS: 0 10*3/uL (ref 0.0–0.5)
Eosinophils Relative: 0 %
HCT: 44.4 % (ref 39.0–52.0)
Hemoglobin: 14.9 g/dL (ref 13.0–17.0)
IMMATURE GRANULOCYTES: 1 %
Lymphocytes Relative: 11 %
Lymphs Abs: 1.4 10*3/uL (ref 0.7–4.0)
MCH: 32.8 pg (ref 26.0–34.0)
MCHC: 33.6 g/dL (ref 30.0–36.0)
MCV: 97.8 fL (ref 80.0–100.0)
Monocytes Absolute: 1 10*3/uL (ref 0.1–1.0)
Monocytes Relative: 8 %
NEUTROS ABS: 10.1 10*3/uL — AB (ref 1.7–7.7)
Neutrophils Relative %: 80 %
Platelet Count: 110 10*3/uL — ABNORMAL LOW (ref 150–400)
RBC: 4.54 MIL/uL (ref 4.22–5.81)
RDW: 14.2 % (ref 11.5–15.5)
WBC Count: 12.7 10*3/uL — ABNORMAL HIGH (ref 4.0–10.5)
nRBC: 0 % (ref 0.0–0.2)

## 2018-01-27 NOTE — Telephone Encounter (Signed)
Ok with referral to palliative care.  Would be ok with tramadol for pain if they want. This is a low dose narcotic. We can send this in if needed.  Algis Greenhouse. [name], MD 01/27/2018 3:36 PM

## 2018-01-27 NOTE — Progress Notes (Signed)
[facility] [phone]   [phone]  OFFICE PROGRESS NOTE  [name], MD [place] [address]  DIAGNOSIS:  stage IV (T1a, N3, M1a) non-small cell lung cancer, squamous cell carcinoma presented with right lower lobe lung nodule in addition to bilateral mediastinal as well as left supraclavicular lymphadenopathy and multiple bilateral pulmonary nodules diagnosed in October 2019.  PDL 1 expression 1%  PRIOR THERAPY: None  CURRENT THERAPY: None  INTERVAL HISTORY: Michael Mcfarland 52 y.o. male returns to the clinic today for follow-up visit accompanied by his sister and her husband.  The patient continues to complain of increasing fatigue and weakness as well as hoarseness of his voice and shortness of breath.  He has a swelling of his neck secondary to the lymphadenopathy.  He denied having any chest pain but has mild cough with no hemoptysis.  He has no recent weight loss or night sweats.  He has no nausea, vomiting, diarrhea or constipation.  Unfortunately he continues to smoke and drinks alcohol at regular basis and he is not willing to quit.  He had several studies performed recently including MRI of the brain that showed no evidence of metastatic disease to the brain.  The patient also had a PET scan performed recently and he is here for evaluation and discussion of his treatment options based on the recent imaging studies.  MEDICAL HISTORY: Past Medical History:  Diagnosis Date  . Alcohol abuse    /notes 01/18/2017  . Alcohol related seizure ([facility]) 01/17/2017   Archie Endo 01/17/2017  . Anxiety   . Arthritis   . Cirrhosis, alcoholic ([facility]) 60/6301  . COPD (chronic obstructive pulmonary disease) ([facility])   . Depression   . Emphysema lung ([facility])   . Frequent headaches   . GERD (gastroesophageal reflux disease)   . Hay fever   . Hepatitis B   . IBS (irritable bowel syndrome)   . Migraine    frequency "depends on the weather"  (01/19/2017)  . Panic attacks   . Pneumonia   . Portal hypertensive gastropathy ([facility])   . Seizure ([place]) 05/20/2017 X 2  . Stroke [facility])     ALLERGIES:  has No Known Allergies.  MEDICATIONS:  Current Outpatient Medications  Medication Sig Dispense Refill  . albuterol (PROVENTIL HFA;VENTOLIN HFA) 108 (90 Base) MCG/ACT inhaler TAKE 2 PUFFS BY MOUTH EVERY 6 HOURS AS NEEDED FOR WHEEZE OR SHORTNESS OF BREATH 8.5 Inhaler 0  . baclofen (LIORESAL) 10 MG tablet Take 1 tablet (10 mg total) by mouth 3 (three) times daily. 30 each 0  . doxycycline (VIBRA-TABS) 100 MG tablet Take 1 tablet (100 mg total) by mouth 2 (two) times daily. 14 tablet 0  . feeding supplement, ENSURE ENLIVE, (ENSURE ENLIVE) LIQD Take 237 mLs by mouth 2 (two) times daily between meals. 601 mL 0  . folic acid (FOLVITE) 1 MG tablet Take 1 tablet (1 mg total) by mouth daily. 30 tablet 5  . ipratropium (ATROVENT) 0.06 % nasal spray Place 2 sprays into both nostrils 4 (four) times daily. 15 mL 0  . lactulose (CHRONULAC) 10 GM/15ML solution Take 16mL one to three times daily to titrate for 2-3 soft stools per day 946 mL 5  . Multiple Vitamin (MULTIVITAMIN WITH MINERALS) TABS tablet Take 1 tablet by mouth daily. 30 tablet 5  . nadolol (CORGARD) 20 MG tablet Take 1 tablet (20 mg total) by mouth daily. 30 tablet 3  . nortriptyline (PAMELOR)  75 MG capsule Take 2 capsules (150 mg total) by mouth at bedtime. 180 capsule 1  . nystatin (MYCOSTATIN/NYSTOP) powder Apply topically 4 (four) times daily. 60 g 0  . pantoprazole (PROTONIX) 40 MG tablet Take 1 tablet (40 mg total) by mouth daily at 12 noon. 90 tablet 3  . thiamine 100 MG tablet Take 1 tablet (100 mg total) by mouth daily.    . Tiotropium Bromide Monohydrate (SPIRIVA RESPIMAT) 2.5 MCG/ACT AERS Inhale 2 puffs into the lungs daily. 4 g 3   No current facility-administered medications for this visit.     SURGICAL HISTORY:  Past Surgical History:  Procedure Laterality Date  . ANTERIOR  CERVICAL DECOMP/DISCECTOMY FUSION    . ESOPHAGOGASTRODUODENOSCOPY N/A 06/18/2017   Procedure: ESOPHAGOGASTRODUODENOSCOPY (EGD);  Surgeon: Doran Stabler, MD;  Location: [facility];  Service: Gastroenterology;  Laterality: N/A;  . LUMBAR DISC SURGERY      REVIEW OF SYSTEMS:  Constitutional: positive for anorexia, fatigue and weight loss Eyes: negative Ears, nose, mouth, throat, and face: positive for hoarseness Respiratory: positive for dyspnea on exertion Cardiovascular: negative Gastrointestinal: negative Genitourinary:negative Integument/breast: negative Hematologic/lymphatic: negative Musculoskeletal:negative Neurological: negative Behavioral/Psych: negative Endocrine: negative Allergic/Immunologic: negative   PHYSICAL EXAMINATION: General appearance: alert, distracted, fatigued and mild distress Head: Normocephalic, without obvious abnormality, atraumatic Neck: marked anterior cervical adenopathy, no JVD, supple, symmetrical, trachea midline and thyroid not enlarged, symmetric, no tenderness/mass/nodules Lymph nodes: Cervical adenopathy: Palpable cervical lymphadenopathy bilaterally. Resp: wheezes bilaterally Back: symmetric, no curvature. ROM normal. No CVA tenderness. Cardio: regular rate and rhythm, S1, S2 normal, no murmur, click, rub or gallop GI: soft, non-tender; bowel sounds normal; no masses,  no organomegaly Extremities: extremities normal, atraumatic, no cyanosis or edema  ECOG PERFORMANCE STATUS: 1 - Symptomatic but completely ambulatory  Blood pressure 124/67, pulse 96, temperature 98.3 F (36.8 C), temperature source Oral, resp. rate (!) 22, height 5\' 9"  (1.753 m), weight 217 lb 8 oz (98.7 kg), SpO2 90 %.  LABORATORY DATA: Lab Results  Component Value Date   WBC 12.7 (H) 01/27/2018   HGB 14.9 01/27/2018   HCT 44.4 01/27/2018   MCV 97.8 01/27/2018   PLT 110 (L) 01/27/2018      Chemistry      Component Value Date/Time   NA 137 01/27/2018 1123   K  3.9 01/27/2018 1123   CL 100 01/27/2018 1123   CO2 27 01/27/2018 1123   BUN 13 01/27/2018 1123   CREATININE 0.87 01/27/2018 1123   CREATININE 0.88 02/10/2017 1400      Component Value Date/Time   CALCIUM 11.1 (H) 01/27/2018 1123   ALKPHOS 180 (H) 01/27/2018 1123   AST 52 (H) 01/27/2018 1123   ALT 17 01/27/2018 1123   BILITOT 4.2 (HH) 01/27/2018 1123       RADIOGRAPHIC STUDIES: Dg Chest 2 View  Result Date: 01/01/2018 CLINICAL DATA:  Low back pain and shortness of breath for 2 months. EXAM: CHEST - 2 VIEW COMPARISON:  PA and lateral chest 06/15/2017, 05/20/2017 and 01/17/2017. FINDINGS: Hazy bilateral pulmonary opacities are identified. No pneumothorax or pleural effusion. Heart size is enlarged. No acute or focal bony abnormality. IMPRESSION: Hazy bilateral pulmonary opacities have an appearance most consistent with pulmonary edema. Electronically Signed   By: Inge Rise M.D.   On: 01/01/2018 16:33   Dg Lumbar Spine Complete  Result Date: 01/01/2018 CLINICAL DATA:  Low back pain EXAM: LUMBAR SPINE - COMPLETE 4+ VIEW COMPARISON:  07/31/2008 FINDINGS: Degenerative spurring anteriorly. Slight disc space narrowing at  L5-S1. Moderate compression deformity at T12, age indeterminate but new since 2010. No subluxation. SI joints are symmetric and unremarkable. IMPRESSION: Moderate compression deformity at T12, age indeterminate but new since 2010. Electronically Signed   By: Rolm Baptise M.D.   On: 01/01/2018 15:40   Ct Head Wo Contrast  Result Date: 01/01/2018 CLINICAL DATA:  Altered mental status EXAM: CT HEAD WITHOUT CONTRAST TECHNIQUE: Contiguous axial images were obtained from the base of the skull through the vertex without intravenous contrast. COMPARISON:  07/17/2017 FINDINGS: Brain: No acute intracranial abnormality. Specifically, no hemorrhage, hydrocephalus, mass lesion, acute infarction, or significant intracranial injury. Vascular: No hyperdense vessel or unexpected  calcification. Skull: No acute calvarial abnormality. Sinuses/Orbits: Visualized paranasal sinuses and mastoids clear. Orbital soft tissues unremarkable. Other: None IMPRESSION: No acute intracranial abnormality. Electronically Signed   By: Rolm Baptise M.D.   On: 01/01/2018 15:41   Ct Chest W Contrast  Result Date: 01/01/2018 CLINICAL DATA:  Dyspnea and difficulty swallowing. EXAM: CT CHEST WITH CONTRAST TECHNIQUE: Multidetector CT imaging of the chest was performed during intravenous contrast administration. CONTRAST:  57mL OMNIPAQUE IOHEXOL 300 MG/ML  SOLN COMPARISON:  12/24/2017 neck CT FINDINGS: Cardiovascular: Atherosclerosis at the origins of the great vessels. Nonaneurysmal thoracic aorta with atherosclerosis. Moderate degree of three-vessel coronary arteriosclerosis. Heart size is top normal without pericardial effusion. No large central pulmonary embolus is identified though the study is not tailored toward assessment of pulmonary emboli. Mediastinum/Nodes: Extensive mediastinal and bilateral hilar lymphadenopathy with representative lymph nodes as follows: 1. Right paratracheal 2.4 cm short axis lymph node, series 2/48 2. Left paratracheal 2.1 cm short axis lymph node, series 2/62 3. AP window lymph node measuring 2.3 cm short axis, series 2/58 4. Right tracheobronchial 2.5 cm short axis lymph node, series 2/68 5. Left hilar lymph node measuring 2.7 cm short axis, series 2/77 6. Subcarinal 2.1 cm short axis lymph node, series 2/75 Other additional smaller prevascular and left lower paratracheal lymph nodes are present. Lungs/Pleura: Too numerous to count pulmonary nodules some of which demonstrate cavitation are identified scattered throughout both lungs. The dominant nodule is seen in the posterior basal segment of the right lower lobe measuring 1.5 x 1.6 x 1.6 cm. Subpleural wedge-shaped consolidation in the lingula likely reflects an area of atelectatic lung. No pleural effusion or pneumothorax.  Upper Abdomen: Morphologic changes of cirrhosis with surface nodularity of the liver noted. No definite adrenal mass. Small epigastric nodules may reflect adenopathy. Splenules are present about the spleen. Musculoskeletal: No aggressive osseous lesions. Chronic degenerative change along the thoracic and upper lumbar spine with chronic compression deformity of T12. ACDF of the included lower cervical spine. IMPRESSION: 1. Multiple, too numerous to count pulmonary nodules some which are slightly cavitary in appearance, the dominant nodule in the right lower lobe measuring 1.5 x 1.6 x 1.6 cm. Referral to pulmonology or cardiothoracic surgery is suggested. 2. Mediastinal, bilateral hilar and epigastric lymphadenopathy concerning for metastasis. 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 3. Chronic degenerative change of the midthoracic spine with chronic moderate compression deformity of T12. 4. Morphologic changes of cirrhosis. Aortic Atherosclerosis (ICD10-I70.0). Electronically Signed   By: Ashley Royalty M.D.   On: 01/01/2018 19:12   Mr Jeri Cos GG Contrast  Result Date: 01/21/2018 CLINICAL DATA:  52 y/o  M; non-small cell lung cancer for staging. EXAM: MRI HEAD WITHOUT AND WITH CONTRAST TECHNIQUE: Multiplanar, multiecho pulse sequences of the brain and surrounding structures were obtained without  and with intravenous contrast. CONTRAST:  10 cc Gadavist COMPARISON:  06/16/2017 MRI head.  01/01/2018 CT head. FINDINGS: Brain: Motion degradation of multiple sequences. No acute infarction, hemorrhage, hydrocephalus, extra-axial collection or mass lesion. No significant signal abnormality. Mild volume loss. After administration of intravenous contrast there is no abnormal enhancement. Vascular: Normal flow voids. Skull and upper cervical spine: Normal marrow signal. Sinuses/Orbits: Mild mucosal thickening of paranasal sinuses. No normal signal of  mastoid air cells. Orbits are unremarkable. Other: None. IMPRESSION: 1. Motion degradation of multiple sequences. 2. No acute intracranial abnormality or intracranial metastasis identified. 3. Stable mild volume loss of the brain. Electronically Signed   By: Kristine Garbe M.D.   On: 01/21/2018 17:26   Nm Pet Image Initial (pi) Skull Base To Thigh  Result Date: 01/23/2018 CLINICAL DATA:  Initial treatment strategy for metastatic non-small cell lung cancer. EXAM: NUCLEAR MEDICINE PET SKULL BASE TO THIGH TECHNIQUE: 11.37 mCi F-18 FDG was injected intravenously. Full-ring PET imaging was performed from the skull base to thigh after the radiotracer. CT data was obtained and used for attenuation correction and anatomic localization. Fasting blood glucose: 77 mg/dl COMPARISON:  Chest CT 01/01/2018 FINDINGS: Mediastinal blood pool activity: SUV max 2.37 NECK: Bulky left supraclavicular nodal mass appears somewhat necrotic. It measures approximately 3.7 x 2.8 cm on image number 43 and is hypermetabolic with SUV max of 84.1 13.5 mm right supraclavicular node on image number 37 has an SUV max of 13.67. The right vocal cord is hypermetabolic. This is likely due to paralysis of the left 0 cul cord. Incidental CT findings: none CHEST: Extensive mediastinal hilar lymphadenopathy as demonstrated on the recent chest CT. All of the nodes are markedly hypermetabolic consistent with nodal metastasis. Right paratracheal node on image number 59 measures 22 mm and SUV max is 17.4. Right hilar node on image number 74 measures 22 mm and SUV max is 15.06. 19 mm subcarinal lymph node on image number 75 has an SUV max of 11.4. There are innumerable bilateral pulmonary nodules consistent with pulmonary metastatic disease. Several of these are large enough to be hypermetabolic and are consistent with metastasis. The largest nodule at the right lung base measures 17 mm and the SUV max is 7.73. Incidental CT findings: none  ABDOMEN/PELVIS: No hypermetabolic lesions in the liver or adrenal glands to suggest metastatic disease. The spleen, pancreas and kidneys are also unremarkable. No enlarged or hypermetabolic abdominal/pelvic lymph nodes to suggest metastatic disease. Mild hypermetabolism in the anal region is likely due to hemorrhoids. Incidental CT findings: Numerous layering gallstones in the gallbladder. Advanced atherosclerotic calcifications involving the aorta and iliac arteries. SKELETON: No focal hypermetabolic activity to suggest skeletal metastasis. Incidental CT findings: none IMPRESSION: 1. Extensive hypermetabolic mediastinal, hilar and supraclavicular adenopathy consistent with metastatic nodal disease. 2. Numerous hypermetabolic pulmonary nodules consistent with metastatic disease. The largest lesion at the right lung base may be the primary. 3. No findings for metastatic disease involving the abdomen/pelvis or bony structures. 4. Hypermetabolic right vocal cord likely due to paralysis of the left vocal cord. Electronically Signed   By: Marijo Sanes M.D.   On: 01/23/2018 12:46   Dg Swallowing Func-speech Pathology  Result Date: 01/11/2018 Please refer to "Notes" tab for Speech Pathology notes.  [place] Core Biopsy (lymph Nodes)  Result Date: 01/03/2018 INDICATION: No known primary, now with multiple pulmonary nodules, mediastinal, hilar and cervical lymphadenopathy. Please from ultrasound-guided left cervical lymph node biopsy for tissue diagnostic purposes. EXAM: ULTRASOUND-GUIDED BIOPSY DOMINANT LEFT LATERAL  CERVICAL LYMPH NODE COMPARISON:  Chest CT - 01/01/2018; neck CT - 12/24/2017 MEDICATIONS: None ANESTHESIA/SEDATION: Moderate (conscious) sedation was employed during this procedure. A total of Versed 2 mg and Fentanyl 100 mcg was administered intravenously. Moderate Sedation Time: 12 minutes. The patient's level of consciousness and vital signs were monitored continuously by radiology nursing throughout the  procedure under my direct supervision. COMPLICATIONS: None immediate. TECHNIQUE: Informed written consent was obtained from the patient after a discussion of the risks, benefits and alternatives to treatment. Questions regarding the procedure were encouraged and answered. Initial ultrasound scanning demonstrated multiple pathologically enlarged left cervical lymph nodes. A dominant left lateral cervical lymph node measuring approximately 3.1 x 1.5 cm (image 10) correlating with the lymph node seen on preceding contrast-enhanced neck CT image 65, series 3) was targeted for biopsy given location and sonographic window. An ultrasound image was saved for documentation purposes. The procedure was planned. A timeout was performed prior to the initiation of the procedure. The operative was prepped and draped in the usual sterile fashion, and a sterile drape was applied covering the operative field. A timeout was performed prior to the initiation of the procedure. Local anesthesia was provided with 1% lidocaine with epinephrine. Under direct ultrasound guidance, an 18 gauge core needle device was utilized to obtain to obtain 6 core needle biopsies of the dominant left cervical lymph node. The samples were placed in saline and submitted to pathology. The needle was removed and hemostasis was achieved with manual compression. Post procedure scan was negative for significant hematoma. A dressing was placed. The patient tolerated the procedure well without immediate postprocedural complication. IMPRESSION: Technically successful ultrasound guided biopsy of dominant left lateral cervical lymph node. Electronically Signed   By: Sandi Mariscal M.D.   On: 01/03/2018 15:03    ASSESSMENT AND PLAN: This is a very pleasant 52 years old white male recently diagnosed with a stage IV non-small cell lung cancer, squamous cell carcinoma with PDL 1 expression of 1%.  He presented with massive bilateral cervical and supraclavicular  lymphadenopathy as well as bilateral mediastinal lymphadenopathy. The patient has a long-standing history of tobacco and alcohol abuse.  He also has liver cirrhosis.  Serum bilirubin is elevated today. I had a lengthy discussion with the patient and his sister and her husband about his current condition and treatment options.  I explained to the patient that he has incurable condition and all the treatment will be of palliative nature. I gave the patient the option of palliative care and hospice referral versus consideration of palliative systemic chemotherapy with carboplatin, paclitaxel and Keytruda for 4 cycles followed by maintenance Keytruda versus only palliative radiotherapy to the massive lymphadenopathy in the neck and chest mainly for quality of life. The patient was unable to make any decision but his sister does not believe that he will be strong enough to receive any systemic therapy.  She is interested in talking to the radiation oncologist for discussion of the palliative radiotherapy but they may consider just palliative care and hospice referral.  The patient and his sister understand to call Dr. [name] if he decided to go with the hospice care. I will see him on as-needed basis at this point. The patient was advised to call if he has any other concerning symptoms or if he change his mind regarding treatment with systemic chemotherapy which could be challenging with his liver cirrhosis and hepatic dysfunction. The patient voices understanding of current disease status and treatment options and is in  agreement with the current care plan.  All questions were answered. The patient knows to call the clinic with any problems, questions or concerns. We can certainly see the patient much sooner if necessary.  I spent 15 minutes counseling the patient face to face. The total time spent in the appointment was 25 minutes.  Disclaimer: This note was dictated with voice recognition software.  Similar sounding words can inadvertently be transcribed and may not be corrected upon review.

## 2018-01-27 NOTE — Telephone Encounter (Signed)
See note

## 2018-01-27 NOTE — Telephone Encounter (Signed)
Please advise 

## 2018-01-27 NOTE — Telephone Encounter (Signed)
Copied from [facility] [phone]. Topic: General - Inquiry >> Jan 27, 2018  2:55 PM Margot Ables wrote: Reason for CRM: Met with oncology today and pt will be scheduled for radiation to help shrink the tumors in pts throat to see if that helps. The tumors are closing his airways. The family is requesting referral for hospice or palliative care as it is becoming too much for them. Please advise.  Mariann Laster also asked about steroids or other medication to help with pain. Pt is very uncomfortable. Pt has hx of substance abuse but she states he is dying. What can be done to help?  [phone](Phone) [phone](Fax)

## 2018-01-28 ENCOUNTER — Encounter (HOSPITAL_COMMUNITY): Payer: Self-pay

## 2018-01-28 ENCOUNTER — Inpatient Hospital Stay (HOSPITAL_COMMUNITY)
Admission: EM | Admit: 2018-01-28 | Discharge: 2018-02-27 | DRG: 896 | Disposition: E | Payer: Self-pay | Attending: Internal Medicine | Admitting: Internal Medicine

## 2018-01-28 ENCOUNTER — Encounter: Payer: Self-pay | Admitting: Family Medicine

## 2018-01-28 ENCOUNTER — Emergency Department (HOSPITAL_COMMUNITY): Payer: Self-pay

## 2018-01-28 ENCOUNTER — Other Ambulatory Visit: Payer: Self-pay

## 2018-01-28 DIAGNOSIS — K219 Gastro-esophageal reflux disease without esophagitis: Secondary | ICD-10-CM | POA: Diagnosis present

## 2018-01-28 DIAGNOSIS — Z8379 Family history of other diseases of the digestive system: Secondary | ICD-10-CM

## 2018-01-28 DIAGNOSIS — C3491 Malignant neoplasm of unspecified part of right bronchus or lung: Secondary | ICD-10-CM | POA: Diagnosis present

## 2018-01-28 DIAGNOSIS — I8510 Secondary esophageal varices without bleeding: Secondary | ICD-10-CM | POA: Diagnosis present

## 2018-01-28 DIAGNOSIS — Z791 Long term (current) use of non-steroidal anti-inflammatories (NSAID): Secondary | ICD-10-CM

## 2018-01-28 DIAGNOSIS — K7030 Alcoholic cirrhosis of liver without ascites: Secondary | ICD-10-CM | POA: Diagnosis present

## 2018-01-28 DIAGNOSIS — Z833 Family history of diabetes mellitus: Secondary | ICD-10-CM

## 2018-01-28 DIAGNOSIS — J9601 Acute respiratory failure with hypoxia: Secondary | ICD-10-CM | POA: Diagnosis present

## 2018-01-28 DIAGNOSIS — Z8679 Personal history of other diseases of the circulatory system: Secondary | ICD-10-CM

## 2018-01-28 DIAGNOSIS — Z8261 Family history of arthritis: Secondary | ICD-10-CM

## 2018-01-28 DIAGNOSIS — F17210 Nicotine dependence, cigarettes, uncomplicated: Secondary | ICD-10-CM | POA: Diagnosis present

## 2018-01-28 DIAGNOSIS — F10231 Alcohol dependence with withdrawal delirium: Principal | ICD-10-CM | POA: Diagnosis present

## 2018-01-28 DIAGNOSIS — G9340 Encephalopathy, unspecified: Secondary | ICD-10-CM

## 2018-01-28 DIAGNOSIS — F10931 Alcohol use, unspecified with withdrawal delirium: Secondary | ICD-10-CM

## 2018-01-28 DIAGNOSIS — M542 Cervicalgia: Secondary | ICD-10-CM | POA: Diagnosis present

## 2018-01-28 DIAGNOSIS — Z8249 Family history of ischemic heart disease and other diseases of the circulatory system: Secondary | ICD-10-CM

## 2018-01-28 DIAGNOSIS — R7989 Other specified abnormal findings of blood chemistry: Secondary | ICD-10-CM

## 2018-01-28 DIAGNOSIS — M6282 Rhabdomyolysis: Secondary | ICD-10-CM | POA: Diagnosis present

## 2018-01-28 DIAGNOSIS — J810 Acute pulmonary edema: Secondary | ICD-10-CM

## 2018-01-28 DIAGNOSIS — Z818 Family history of other mental and behavioral disorders: Secondary | ICD-10-CM

## 2018-01-28 DIAGNOSIS — E860 Dehydration: Secondary | ICD-10-CM | POA: Diagnosis present

## 2018-01-28 DIAGNOSIS — J3800 Paralysis of vocal cords and larynx, unspecified: Secondary | ICD-10-CM | POA: Diagnosis present

## 2018-01-28 DIAGNOSIS — Z79899 Other long term (current) drug therapy: Secondary | ICD-10-CM

## 2018-01-28 DIAGNOSIS — G893 Neoplasm related pain (acute) (chronic): Secondary | ICD-10-CM | POA: Diagnosis present

## 2018-01-28 DIAGNOSIS — C3490 Malignant neoplasm of unspecified part of unspecified bronchus or lung: Secondary | ICD-10-CM

## 2018-01-28 DIAGNOSIS — G9341 Metabolic encephalopathy: Secondary | ICD-10-CM | POA: Diagnosis present

## 2018-01-28 DIAGNOSIS — Z981 Arthrodesis status: Secondary | ICD-10-CM

## 2018-01-28 DIAGNOSIS — D6959 Other secondary thrombocytopenia: Secondary | ICD-10-CM | POA: Diagnosis present

## 2018-01-28 DIAGNOSIS — F10939 Alcohol use, unspecified with withdrawal, unspecified: Secondary | ICD-10-CM | POA: Diagnosis present

## 2018-01-28 DIAGNOSIS — R1319 Other dysphagia: Secondary | ICD-10-CM | POA: Diagnosis present

## 2018-01-28 DIAGNOSIS — Z66 Do not resuscitate: Secondary | ICD-10-CM | POA: Diagnosis present

## 2018-01-28 DIAGNOSIS — J690 Pneumonitis due to inhalation of food and vomit: Secondary | ICD-10-CM | POA: Diagnosis present

## 2018-01-28 DIAGNOSIS — Z7189 Other specified counseling: Secondary | ICD-10-CM

## 2018-01-28 DIAGNOSIS — Z515 Encounter for palliative care: Secondary | ICD-10-CM | POA: Diagnosis present

## 2018-01-28 DIAGNOSIS — Y95 Nosocomial condition: Secondary | ICD-10-CM | POA: Diagnosis present

## 2018-01-28 DIAGNOSIS — F10232 Alcohol dependence with withdrawal with perceptual disturbance: Secondary | ICD-10-CM

## 2018-01-28 DIAGNOSIS — J439 Emphysema, unspecified: Secondary | ICD-10-CM | POA: Diagnosis present

## 2018-01-28 DIAGNOSIS — K766 Portal hypertension: Secondary | ICD-10-CM | POA: Diagnosis present

## 2018-01-28 DIAGNOSIS — F10239 Alcohol dependence with withdrawal, unspecified: Secondary | ICD-10-CM | POA: Diagnosis present

## 2018-01-28 DIAGNOSIS — J9602 Acute respiratory failure with hypercapnia: Secondary | ICD-10-CM

## 2018-01-28 DIAGNOSIS — M545 Low back pain: Secondary | ICD-10-CM | POA: Diagnosis present

## 2018-01-28 DIAGNOSIS — J189 Pneumonia, unspecified organism: Secondary | ICD-10-CM

## 2018-01-28 DIAGNOSIS — R0902 Hypoxemia: Secondary | ICD-10-CM

## 2018-01-28 DIAGNOSIS — I700 Atherosclerosis of aorta: Secondary | ICD-10-CM | POA: Diagnosis present

## 2018-01-28 DIAGNOSIS — R945 Abnormal results of liver function studies: Secondary | ICD-10-CM

## 2018-01-28 DIAGNOSIS — Z8673 Personal history of transient ischemic attack (TIA), and cerebral infarction without residual deficits: Secondary | ICD-10-CM

## 2018-01-28 DIAGNOSIS — K7290 Hepatic failure, unspecified without coma: Secondary | ICD-10-CM | POA: Diagnosis present

## 2018-01-28 DIAGNOSIS — C7989 Secondary malignant neoplasm of other specified sites: Secondary | ICD-10-CM | POA: Diagnosis present

## 2018-01-28 DIAGNOSIS — K3189 Other diseases of stomach and duodenum: Secondary | ICD-10-CM | POA: Diagnosis present

## 2018-01-28 LAB — COMPREHENSIVE METABOLIC PANEL
ALT: 26 U/L (ref 0–44)
AST: 80 U/L — ABNORMAL HIGH (ref 15–41)
Albumin: 3.3 g/dL — ABNORMAL LOW (ref 3.5–5.0)
Alkaline Phosphatase: 142 U/L — ABNORMAL HIGH (ref 38–126)
Anion gap: 12 (ref 5–15)
BILIRUBIN TOTAL: 4.5 mg/dL — AB (ref 0.3–1.2)
BUN: 15 mg/dL (ref 6–20)
CHLORIDE: 100 mmol/L (ref 98–111)
CO2: 25 mmol/L (ref 22–32)
CREATININE: 0.83 mg/dL (ref 0.61–1.24)
Calcium: 10.9 mg/dL — ABNORMAL HIGH (ref 8.9–10.3)
GFR calc non Af Amer: >60 mL/min (ref >60)
Glucose, Bld: 122 mg/dL — ABNORMAL HIGH (ref 70–99)
Potassium: 3.7 mmol/L (ref 3.5–5.1)
Sodium: 137 mmol/L (ref 135–145)
TOTAL PROTEIN: 7.2 g/dL (ref 6.5–8.1)

## 2018-01-28 LAB — CBC WITH DIFFERENTIAL/PLATELET
ABS IMMATURE GRANULOCYTES: 0.16 10*3/uL — AB (ref 0.00–0.07)
Basophils Absolute: 0.1 10*3/uL (ref 0.0–0.1)
Basophils Relative: 0 %
Eosinophils Absolute: 0 10*3/uL (ref 0.0–0.5)
Eosinophils Relative: 0 %
HEMATOCRIT: 45.2 % (ref 39.0–52.0)
Hemoglobin: 14.7 g/dL (ref 13.0–17.0)
Immature Granulocytes: 1 %
LYMPHS ABS: 1 10*3/uL (ref 0.7–4.0)
LYMPHS PCT: 7 %
MCH: 32.4 pg (ref 26.0–34.0)
MCHC: 32.5 g/dL (ref 30.0–36.0)
MCV: 99.6 fL (ref 80.0–100.0)
MONO ABS: 1.1 10*3/uL — AB (ref 0.1–1.0)
MONOS PCT: 8 %
NEUTROS ABS: 11.5 10*3/uL — AB (ref 1.7–7.7)
Neutrophils Relative %: 84 %
Platelets: 126 10*3/uL — ABNORMAL LOW (ref 150–400)
RBC: 4.54 MIL/uL (ref 4.22–5.81)
RDW: 14.2 % (ref 11.5–15.5)
WBC: 13.8 10*3/uL — ABNORMAL HIGH (ref 4.0–10.5)
nRBC: 0 % (ref 0.0–0.2)

## 2018-01-28 LAB — URINALYSIS, ROUTINE W REFLEX MICROSCOPIC
Bacteria, UA: NONE SEEN
Bilirubin Urine: NEGATIVE
GLUCOSE, UA: NEGATIVE mg/dL
HGB URINE DIPSTICK: NEGATIVE
Ketones, ur: 5 mg/dL — AB
Leukocytes, UA: NEGATIVE
NITRITE: NEGATIVE
PH: 5 (ref 5.0–8.0)
PROTEIN: 100 mg/dL — AB
Specific Gravity, Urine: 1.031 — ABNORMAL HIGH (ref 1.005–1.030)

## 2018-01-28 LAB — I-STAT CG4 LACTIC ACID, ED
LACTIC ACID, VENOUS: 2.26 mmol/L — AB (ref 0.5–1.9)
Lactic Acid, Venous: 2.03 mmol/L (ref 0.5–1.9)

## 2018-01-28 LAB — MRSA PCR SCREENING: MRSA by PCR: NEGATIVE

## 2018-01-28 LAB — BLOOD GAS, ARTERIAL
Acid-Base Excess: 2.8 mmol/L — ABNORMAL HIGH (ref 0.0–2.0)
Acid-base deficit: 2.9 mmol/L — ABNORMAL HIGH (ref 0.0–2.0)
Bicarbonate: 27 mmol/L (ref 20.0–28.0)
DRAWN BY: 257701
O2 Content: 3 L/min
O2 Saturation: 88.9 %
PCO2 ART: 41.2 mmHg (ref 32.0–48.0)
PH ART: 7.431 (ref 7.350–7.450)
PO2 ART: 58.3 mmHg — AB (ref 83.0–108.0)
Patient temperature: 98.3

## 2018-01-28 LAB — LIPASE, BLOOD: LIPASE: 44 U/L (ref 11–51)

## 2018-01-28 LAB — BRAIN NATRIURETIC PEPTIDE: B NATRIURETIC PEPTIDE 5: 60.6 pg/mL (ref 0.0–100.0)

## 2018-01-28 LAB — APTT: APTT: 32 s (ref 24–36)

## 2018-01-28 LAB — AMMONIA: Ammonia: 34 umol/L (ref 9–35)

## 2018-01-28 LAB — TROPONIN I: Troponin I: 0.03 ng/mL (ref <0.03)

## 2018-01-28 LAB — CK: Total CK: 1517 U/L — ABNORMAL HIGH (ref 49–397)

## 2018-01-28 LAB — PROTIME-INR
INR: 1.46
PROTHROMBIN TIME: 17.6 s — AB (ref 11.4–15.2)

## 2018-01-28 MED ORDER — VANCOMYCIN HCL IN DEXTROSE 1-5 GM/200ML-% IV SOLN
1000.0000 mg | Freq: Once | INTRAVENOUS | Status: AC
  Administered 2018-01-28: 1000 mg via INTRAVENOUS
  Filled 2018-01-28: qty 200

## 2018-01-28 MED ORDER — TIOTROPIUM BROMIDE MONOHYDRATE 2.5 MCG/ACT IN AERS: 2.0000 | INHALATION_SPRAY | Freq: Every day | RESPIRATORY_TRACT | Status: DC

## 2018-01-28 MED ORDER — SODIUM CHLORIDE 0.9 % IV SOLN
2.0000 g | Freq: Once | INTRAVENOUS | Status: AC
  Administered 2018-01-28: 2 g via INTRAVENOUS
  Filled 2018-01-28: qty 2

## 2018-01-28 MED ORDER — SODIUM CHLORIDE 0.9 % IV SOLN
INTRAVENOUS | Status: DC
  Administered 2018-01-28 – 2018-01-29 (×4): via INTRAVENOUS

## 2018-01-28 MED ORDER — DEXAMETHASONE SODIUM PHOSPHATE 10 MG/ML IJ SOLN
10.0000 mg | Freq: Once | INTRAMUSCULAR | Status: AC
  Administered 2018-01-28: 10 mg via INTRAVENOUS
  Filled 2018-01-28: qty 1

## 2018-01-28 MED ORDER — MORPHINE SULFATE (PF) 2 MG/ML IV SOLN
2.0000 mg | INTRAVENOUS | Status: DC | PRN
  Administered 2018-01-30: 4 mg via INTRAVENOUS
  Filled 2018-01-28: qty 2

## 2018-01-28 MED ORDER — BISACODYL 10 MG RE SUPP: 10.0000 mg | Freq: Every day | RECTAL | Status: DC | PRN

## 2018-01-28 MED ORDER — ONDANSETRON HCL 4 MG PO TABS: 4.0000 mg | ORAL_TABLET | Freq: Four times a day (QID) | ORAL | Status: DC | PRN

## 2018-01-28 MED ORDER — ORAL CARE MOUTH RINSE
15.0000 mL | Freq: Two times a day (BID) | OROMUCOSAL | Status: DC
  Administered 2018-01-28: 15 mL via OROMUCOSAL

## 2018-01-28 MED ORDER — UMECLIDINIUM BROMIDE 62.5 MCG/INH IN AEPB
1.0000 | INHALATION_SPRAY | Freq: Every day | RESPIRATORY_TRACT | Status: DC
  Administered 2018-01-29 – 2018-01-30 (×2): 1 via RESPIRATORY_TRACT
  Filled 2018-01-28: qty 7

## 2018-01-28 MED ORDER — METRONIDAZOLE IN NACL 5-0.79 MG/ML-% IV SOLN
500.0000 mg | Freq: Three times a day (TID) | INTRAVENOUS | Status: DC
  Filled 2018-01-28: qty 100

## 2018-01-28 MED ORDER — THIAMINE HCL 100 MG/ML IJ SOLN
100.0000 mg | Freq: Every day | INTRAMUSCULAR | Status: DC
  Administered 2018-01-28 – 2018-01-30 (×3): 100 mg via INTRAVENOUS
  Filled 2018-01-28 (×3): qty 2

## 2018-01-28 MED ORDER — ONDANSETRON HCL 4 MG/2ML IJ SOLN: 4.0000 mg | Freq: Four times a day (QID) | INTRAMUSCULAR | Status: DC | PRN

## 2018-01-28 MED ORDER — FOLIC ACID 5 MG/ML IJ SOLN
1.0000 mg | Freq: Every day | INTRAMUSCULAR | Status: DC
  Administered 2018-01-28 – 2018-01-30 (×3): 1 mg via INTRAVENOUS
  Filled 2018-01-28 (×6): qty 0.2

## 2018-01-28 MED ORDER — LORAZEPAM 2 MG/ML IJ SOLN
2.0000 mg | INTRAMUSCULAR | Status: DC | PRN
  Administered 2018-01-28: 2 mg via INTRAVENOUS
  Administered 2018-01-28: 3 mg via INTRAVENOUS
  Administered 2018-01-28 (×2): 2 mg via INTRAVENOUS
  Administered 2018-01-29: 3 mg via INTRAVENOUS
  Administered 2018-01-29 – 2018-01-30 (×4): 2 mg via INTRAVENOUS
  Administered 2018-01-30: 3 mg via INTRAVENOUS
  Filled 2018-01-28: qty 2
  Filled 2018-01-28 (×2): qty 1
  Filled 2018-01-28: qty 2
  Filled 2018-01-28 (×5): qty 1
  Filled 2018-01-28: qty 2
  Filled 2018-01-28: qty 1

## 2018-01-28 MED ORDER — ALBUTEROL SULFATE (2.5 MG/3ML) 0.083% IN NEBU
2.5000 mg | INHALATION_SOLUTION | RESPIRATORY_TRACT | Status: DC | PRN
  Administered 2018-01-28 – 2018-01-30 (×2): 2.5 mg via RESPIRATORY_TRACT
  Filled 2018-01-28 (×2): qty 3

## 2018-01-28 NOTE — ED Provider Notes (Signed)
[facility] DEPT Provider Note   CSN: [id number] Arrival date & time: 02/25/2018  0703     History   Chief Complaint Chief Complaint  Patient presents with  . Shortness of Breath  . Abdominal Pain    HPI Michael Mcfarland is a 52 y.o. male.  HPI  Level 5 caveat for altered mental status.  52 year old male comes in with chief complaint of shortness of breath.  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  According to patient's sister, Ms. Mariann Laster who has medical power of attorney, patient was living with his other sister yesterday and was found on the floor in the evening.  Patient was unable to get up, and they let him rest on the floor hoping that he will be able to strengthen up and get up this morning -however patient was unable to get up therefore they had to call EMS.  Patient was doing fine yesterday and went to his cancer clinic visit and also dinner.  His last alcoholic drink was 5 days ago.  Family denies any fevers.  Patient was seen in the hospital last month for confusion.  At that time he was diagnosed with cancer.  He does not have any chemo on board at this time.  Past Medical History:  Diagnosis Date  . Alcohol abuse    /notes 01/18/2017  . Alcohol related seizure ([place]) 01/17/2017   Archie Endo 01/17/2017  . Anxiety   . Arthritis   . Cirrhosis, alcoholic ([place]) 40/9811  . COPD (chronic obstructive pulmonary disease) ([place])   . Depression   . Emphysema lung ([place])   . Frequent headaches   . GERD (gastroesophageal reflux disease)   . Hay fever   . Hepatitis B   . IBS (irritable bowel syndrome)   . Migraine    frequency "depends on the weather" (01/19/2017)  . Panic attacks   . Pneumonia   . Portal hypertensive gastropathy ([place])   . Seizure ([place]) 05/20/2017 X 2  . Stroke [facility])     Patient Active Problem List   Diagnosis Date Noted  . Encounter  for antineoplastic chemotherapy 01/13/2018  . Goals of care, counseling/discussion 01/13/2018  . 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 Non-small cell carcinoma of lung ([place]) 01/11/2018  . Secondary esophageal varices without bleeding ([place]) 01/02/2018  . Delirium 01/01/2018  . COPD with acute exacerbation ([place]) 01/01/2018  . Pulmonary nodules 12/28/2017  . Pulmonary emphysema ([place]) 12/28/2017  . Aortic atherosclerosis ([place]) 12/28/2017  . Candidal intertrigo 11/12/2017  . Nicotine dependence with current use 09/17/2017  . Contraindication to anticoagulation therapy 09/17/2017  . Eustachian tube dysfunction 08/30/2017  . Protein-calorie malnutrition ([place]) 07/29/2017  . Alcohol withdrawal syndrome with perceptual disturbance ([place]) 07/18/2017  . Benzodiazepine abuse, episodic ([place]) 07/18/2017  . SDH (subdural hematoma) ([place]) 07/16/2017  . Insomnia 07/08/2017  . Low back pain 07/08/2017  . Esophageal varices in alcoholic cirrhosis ([place])   . Hepatic encephalopathy ([place]) 06/15/2017  . Anxiety 06/03/2017  . Seizure ([place])   . Cirrhosis ([place])   . Thrombocytopenia ([place])   . Alcohol withdrawal seizure ([place]) 01/17/2017    Past Surgical History:  Procedure Laterality Date  . ANTERIOR CERVICAL DECOMP/DISCECTOMY FUSION    . ESOPHAGOGASTRODUODENOSCOPY N/A 06/18/2017   Procedure: ESOPHAGOGASTRODUODENOSCOPY (EGD);  Surgeon: Doran Stabler, MD;  Location: [facility];  Service: Gastroenterology;  Laterality: N/A;  . [place]  SURGERY          Home Medications    Prior to Admission medications   Medication Sig Start Date End Date Taking? Authorizing Provider  albuterol (PROVENTIL HFA;VENTOLIN HFA) 108 (90 Base) MCG/ACT inhaler TAKE 2 PUFFS BY MOUTH EVERY 6 HOURS AS NEEDED FOR WHEEZE OR SHORTNESS OF BREATH Patient taking differently: Inhale 2 puffs into the lungs every 6 (six) hours as needed for wheezing or shortness of breath.  01/20/18  Yes [name],  MD  baclofen (LIORESAL) 10 MG tablet Take 1 tablet (10 mg total) by mouth 3 (three) times daily. 01/11/18  Yes [name], MD  diclofenac (VOLTAREN) 50 MG EC tablet Take 50 mg by mouth 2 (two) times daily.   Yes [provider]  doxycycline (VIBRA-TABS) 100 MG tablet Take 1 tablet (100 mg total) by mouth 2 (two) times daily. 01/11/18  Yes [name], MD  folic acid (FOLVITE) 1 MG tablet Take 1 tablet (1 mg total) by mouth daily. 07/24/17  Yes Emokpae, Courage, MD  nadolol (CORGARD) 20 MG tablet Take 1 tablet (20 mg total) by mouth daily. 11/12/17  Yes [name], MD  nortriptyline (PAMELOR) 75 MG capsule Take 2 capsules (150 mg total) by mouth at bedtime. 12/28/17  Yes [name], MD  Tiotropium Bromide Monohydrate (SPIRIVA RESPIMAT) 2.5 MCG/ACT AERS Inhale 2 puffs into the lungs daily. 12/28/17  Yes [name], MD  feeding supplement, ENSURE ENLIVE, (ENSURE ENLIVE) LIQD Take 237 mLs by mouth 2 (two) times daily between meals. Patient not taking: Reported on 02/05/2018 06/18/17   Kayleen Memos, DO  ipratropium (ATROVENT) 0.06 % nasal spray Place 2 sprays into both nostrils 4 (four) times daily. Patient not taking: Reported on 02/17/2018 11/12/17   [name], MD  lactulose [facility]) 10 GM/15ML solution Take 107mL one to three times daily to titrate for 2-3 soft stools per day Patient not taking: Reported on 02/10/2018 11/12/17   [name], MD  Multiple Vitamin (MULTIVITAMIN WITH MINERALS) TABS tablet Take 1 tablet by mouth daily. Patient not taking: Reported on 02/18/2018 07/25/17   Roxan Hockey, MD  nystatin (MYCOSTATIN/NYSTOP) powder Apply topically 4 (four) times daily. Patient not taking: Reported on 02/23/2018 11/12/17   [name], MD  pantoprazole (PROTONIX) 40 MG tablet Take 1 tablet (40 mg total) by mouth daily at 12 noon. Patient not taking: Reported on 02/22/2018 07/08/17   [name], MD  thiamine 100 MG tablet Take 1 tablet (100 mg total)  by mouth daily. Patient not taking: Reported on 02/14/2018 01/04/18   Mariel Aloe, MD    Family History Family History  Problem Relation Age of Onset  . Diabetes Mother   . Depression Mother   . Hypertension Mother   . Arthritis Mother   . Arthritis Father   . Heart disease Father   . Liver disease Sister        fatty liver  . Heart disease Brother   . Heart attack Paternal Grandfather   . Diabetes Sister     Social History Social History   Tobacco Use  . Smoking status: Current Some Day Smoker    Packs/day: 0.25    Years: 35.00    Pack years: 8.75    Types: Cigarettes  . Smokeless tobacco: Never Used  . Tobacco comment: quit smoking after admission 04/2017  Substance Use Topics  . Alcohol use: Not Currently    Alcohol/week: 18.0 standard drinks  Types: 18 Cans of beer per week    Comment: until admission 04/2017 drinking 12 to 18 beers daily.  since then, no beer or other ETOH (as of 06/17/17)  . Drug use: Not Currently    Comment: 05/20/2017 "been a long time; I tried different things when I was younger"     Allergies   Patient has no known allergies.   Review of Systems Review of Systems  Unable to perform ROS: Mental status change     Physical Exam Updated Vital Signs BP 102/90   Pulse (!) 106   Temp 98.3 F (36.8 C) (Oral)   Resp (!) 34   Ht 5\' 9"  (1.753 m)   Wt 98.4 kg   SpO2 92%   BMI 32.05 kg/m   Physical Exam  Constitutional: He appears well-developed.  HENT:  Head: Atraumatic.  Eyes: EOM are normal.  Cardiovascular:  Tachycardia  Pulmonary/Chest: Accessory muscle usage and stridor present. Tachypnea noted. He has wheezes.  Tachypnea  Abdominal: Soft. There is no tenderness.  Musculoskeletal:       Right lower leg: He exhibits no edema.       Left lower leg: He exhibits no edema.  Neurological: He is disoriented.  It appears that patient is hallucinating, as he has frequent episodes of blank stares and it seems like he is  responding to internal stimuli  Nursing note and vitals reviewed.    ED Treatments / Results  Labs (all labs ordered are listed, but only abnormal results are displayed) Labs Reviewed  COMPREHENSIVE METABOLIC PANEL - Abnormal; Notable for the following components:      Result Value   Glucose, Bld 122 (*)    Calcium 10.9 (*)    Albumin 3.3 (*)    AST 80 (*)    Alkaline Phosphatase 142 (*)    Total Bilirubin 4.5 (*)    All other components within normal limits  CBC WITH DIFFERENTIAL/PLATELET - Abnormal; Notable for the following components:   WBC 13.8 (*)    Platelets 126 (*)    Neutro Abs 11.5 (*)    Monocytes Absolute 1.1 (*)    Abs Immature Granulocytes 0.16 (*)    All other components within normal limits  BLOOD GAS, ARTERIAL - Abnormal; Notable for the following components:   pO2, Arterial 58.3 (*)    Acid-Base Excess 2.8 (*)    Acid-base deficit 2.9 (*)    All other components within normal limits  PROTIME-INR - Abnormal; Notable for the following components:   Prothrombin Time 17.6 (*)    All other components within normal limits  TROPONIN I - Abnormal; Notable for the following components:   Troponin I 0.03 (*)    All other components within normal limits  CK - Abnormal; Notable for the following components:   Total CK 1,517 (*)    All other components within normal limits  I-STAT CG4 LACTIC ACID, ED - Abnormal; Notable for the following components:   Lactic Acid, Venous 2.26 (*)    All other components within normal limits  I-STAT CG4 LACTIC ACID, ED - Abnormal; Notable for the following components:   Lactic Acid, Venous 2.03 (*)    All other components within normal limits  CULTURE, BLOOD (ROUTINE X 2)  CULTURE, BLOOD (ROUTINE X 2)  APTT  BRAIN NATRIURETIC PEPTIDE  LIPASE, BLOOD  AMMONIA  URINALYSIS, ROUTINE W REFLEX MICROSCOPIC    EKG EKG Interpretation  Date/Time:  Friday January 28 2018 07:32:17 EDT Ventricular Rate:  106 PR Interval:    QRS  Duration: 103 QT Interval:  338 QTC Calculation: 449 R Axis:   -54 Text Interpretation:  Sinus tachycardia Borderline prolonged PR interval Probable left atrial enlargement LAD, consider left anterior fascicular block Probable anteroseptal infarct, old ST elevation, consider inferior injury No acute changes Confirmed by Varney Biles [id number]) on 02/20/2018 8:49:45 AM   Radiology Ct Head Wo Contrast  Result Date: 02/06/2018 CLINICAL DATA:  Weakness. EXAM: CT HEAD WITHOUT CONTRAST TECHNIQUE: Contiguous axial images were obtained from the base of the skull through the vertex without intravenous contrast. COMPARISON:  MRI 01/21/2018 FINDINGS: Brain: No acute intracranial abnormality. Specifically, no hemorrhage, hydrocephalus, mass lesion, acute infarction, or significant intracranial injury. Vascular: No hyperdense vessel or unexpected calcification. Skull: No acute calvarial abnormality. Sinuses/Orbits: Mucosal thickening in the paranasal sinuses. No air-fluid levels. Other: None IMPRESSION: No acute intracranial abnormality. Chronic sinusitis. Electronically Signed   By: Rolm Baptise M.D.   On: 02/08/2018 09:26   Dg Chest Port 1 View  Result Date: 02/20/2018 CLINICAL DATA:  Lung carcinoma with weakness EXAM: PORTABLE CHEST 1 VIEW COMPARISON:  PET-CT January 21, 2018; chest radiograph January 01, 2018 FINDINGS: There is a small left pleural effusion. There is interstitial prominence in the lung bases which may represent edema or possibly lymphangitic spread of tumor. Multiple nodular opacities seen on recent PET study are less well seen by radiography. There is no frank airspace consolidation. Heart is mildly enlarged with pulmonary vascularity normal. Adenopathy seen on CT is present but less well seen by radiography. There is aortic atherosclerosis. There is postoperative change in the lower cervical region. IMPRESSION: Cardiomegaly. Small left pleural effusion. Pilar Plate consolidation. Interstitial  prominence in the bases may represent edema or potential lymphangitic spread of tumor. Multiple nodular lesions seen on recent PET study are not well seen by radiography. There is aortic atherosclerosis. Adenopathy is better appreciated on recent CT. Aortic Atherosclerosis (ICD10-I70.0). Electronically Signed   By: Lowella Grip III M.D.   On: 02/26/2018 08:03    Procedures .Critical Care Performed by: Varney Biles, MD Authorized by: Varney Biles, MD   Critical care provider statement:    Critical care time (minutes):  50   Critical care start time:  02/02/2018 7:30 AM   Critical care end time:  01/30/2018 10:00 AM   Critical care was necessary to treat or prevent imminent or life-threatening deterioration of the following conditions:  Respiratory failure, metabolic crisis and CNS failure or compromise   Critical care was time spent personally by me on the following activities:  Discussions with consultants, evaluation of patient's response to treatment, examination of patient, ordering and performing treatments and interventions, ordering and review of laboratory studies, ordering and review of radiographic studies, pulse oximetry, re-evaluation of patient's condition, obtaining history from patient or surrogate and review of old charts   I assumed direction of critical care for this patient from another provider in my specialty: yes     (including critical care time)  Medications Ordered in ED Medications  vancomycin (VANCOCIN) IVPB 1000 mg/200 mL premix (1,000 mg Intravenous New Bag/Given 02/25/2018 0942)  ceFEPIme (MAXIPIME) 2 g in sodium chloride 0.9 % 100 mL IVPB (0 g Intravenous Stopped 01/30/2018 0833)  dexamethasone (DECADRON) injection 10 mg (10 mg Intravenous Given 02/19/2018 5284)     Initial Impression / Assessment and Plan / ED Course  I have reviewed the triage vital signs and the nursing notes.  Pertinent labs & imaging results that were available during  my care of the  patient were reviewed by me and considered in my medical decision making (see chart for details).     52 year old male comes in with chief complaint of shortness of breath. He has medical history of pulmonary cancer with mets to the throat along with alcoholic liver cirrhosis and COPD.  Patient's last alcoholic beverage was 4 days ago.  Patient is not undergoing chemotherapy.  He arrives to the emergency room with tachypnea, tachycardia and hypoxia.  Patient was wheezing and was given breathing treatment.  He is now requiring 4 L of oxygen to keep O2 sats over 90%, and he is still tachypneic but not in respiratory distress.  Upon further discussion with the patient's sister, who has the POA -patient was doing well yesterday and went to his doctor's visit and also dinner at cookout.  He was found on the floor last evening, and stayed there all night because he was unable to get up.  Differential diagnosis for his hypoxia at this time includes pneumonia, PE, COPD exacerbation.  As far as the altered mental status is concerned, differential includes hypercapnic respiratory failure, alcohol withdrawals, hepatic encephalopathy and traumatic brain injury from fall.  @8 :00: I spoke with patient's medical POA, Ms. Mariann Laster.  She states that patient's CODE STATUS is DNR, and that he would not want long-term intubation.  I discussed with her that given his lung cancer, advanced COPD and mets to the throat, there is a possibility that if he requires intubation he might need trach for chronic respiratory failure.  She specifically mentions to me that patient would not want long-term intubation, and that the focus should be comfort care if it gets to that point.  She has agreed to palliative care consultation and is open to hospice discussion if appropriate.  Dr. Rowe Pavy, palliative medicine will see the patient.  @9 :00 ABG reviewed.  Patient reassessed and I spoke with patient's brother-in-law who is at the  bedside.  It appears more more that patient is undergoing alcohol withdrawals.  Ammonia level is normal and patient does not have profound hypercapnia.  It appears to me that patient will require ICU level of care for monitoring, however because of his complex lung disease history and evolving CODE STATUS -I think it might be better to get ICU team involved a friend in patient's care.  Dr. Nelda Marseille, CCM will also assess the patient.  @10 :00: CCM saw the patient.  He is now DNR and DNI. Stepdown requested.   Final Clinical Impressions(s) / ED Diagnoses   Final diagnoses:  Acute respiratory failure with hypoxia and hypercapnia (HCC)  Encephalopathy  Alcohol withdrawal syndrome, with delirium [facility])    ED Discharge Orders    None       Varney Biles, MD 02/11/2018 1010

## 2018-01-28 NOTE — ED Notes (Signed)
Pt verbalizes need to have BM; assisted on bedpan. Pt only smear of brown BM when completed. Pt pericare done and pt repositioned.

## 2018-01-28 NOTE — H&P (Addendum)
History and Physical    Michael Mcfarland PXT:[id number] DOB: Mar 07, 1966 DOA: 02/09/2018  PCP: Michael Barrack, MD Patient coming from: Home  Chief Complaint: Altered mental status  HPI: Michael Mcfarland is a 52 y.o. male with medical history significant of COPD, alcoholic cirrhosis, esophageal varices, metastatic non-small cell lung cancer, alcohol abuse, tobacco use. Patient has vocal cord paralysis and is unable to easily give a history. History provided by patient's sister and brother in law. Yesterday, patient was found down and altered at home. Attempts were made to lift him, which were unsuccessful. He was left on the ground overnight. This morning, he was found to still be altered and very weak/unable to get up, so EMS was called for transport to the ED.   ED Course: Vitals: Afebrile, initially tachycardic but now with normal pulse, initially tachypnic which has resolved, BP normotensive to slightly soft, SpO2 in range of 88-92% on 4L [place] Labs: CK of 1517, troponin of 0.03, glucose of 122, calcium of 142, bilirubin of 4.5, AST of 80 Imaging: CXR significant for cardiomegaly, small pleural effusion, interstitial prominence Medications/Course: Vancomycin and cefepime  Review of Systems: Review of Systems  Constitutional: Negative for chills and fever.  Cardiovascular: Negative for chest pain and palpitations.  Gastrointestinal: Negative for abdominal pain, constipation, diarrhea, nausea and vomiting.  Musculoskeletal: Positive for neck pain.  Neurological: Positive for loss of consciousness.  All other systems reviewed and are negative.   Past Medical History:  Diagnosis Date  . Alcohol abuse    /notes 01/18/2017  . Alcohol related seizure ([place]) 01/17/2017   Archie Endo 01/17/2017  . Anxiety   . Arthritis   . Cirrhosis, alcoholic ([place]) 62/7035  . COPD (chronic obstructive pulmonary disease) ([place])   . Depression   . Emphysema lung ([place])   . Frequent headaches   . GERD  (gastroesophageal reflux disease)   . Hay fever   . Hepatitis B   . IBS (irritable bowel syndrome)   . Migraine    frequency "depends on the weather" (01/19/2017)  . Panic attacks   . Pneumonia   . Portal hypertensive gastropathy ([place])   . Seizure ([place]) 05/20/2017 X 2  . Stroke [facility])     Past Surgical History:  Procedure Laterality Date  . ANTERIOR CERVICAL DECOMP/DISCECTOMY FUSION    . ESOPHAGOGASTRODUODENOSCOPY N/A 06/18/2017   Procedure: ESOPHAGOGASTRODUODENOSCOPY (EGD);  Surgeon: Doran Stabler, MD;  Location: [place];  Service: Gastroenterology;  Laterality: N/A;  . LUMBAR [place]       reports that he has been smoking cigarettes. He has a 8.75 pack-year smoking history. He has never used smokeless tobacco. He reports that he drank about 18.0 standard drinks of alcohol per week. He reports that he has current or past drug history.  No Known Allergies  Family History  Problem Relation Age of Onset  . Diabetes Mother   . Depression Mother   . Hypertension Mother   . Arthritis Mother   . Arthritis Father   . Heart disease Father   . Liver disease Sister        fatty liver  . Heart disease Brother   . Heart attack Paternal Grandfather   . Diabetes Sister     Prior to Admission medications   Medication Sig Start Date End Date Taking? Authorizing Provider  albuterol (PROVENTIL HFA;VENTOLIN HFA) 108 (90 Base) MCG/ACT inhaler TAKE 2 PUFFS BY MOUTH EVERY 6 HOURS AS NEEDED FOR WHEEZE OR SHORTNESS OF BREATH Patient taking  differently: Inhale 2 puffs into the lungs every 6 (six) hours as needed for wheezing or shortness of breath.  01/20/18  Yes Michael Barrack, MD  baclofen (LIORESAL) 10 MG tablet Take 1 tablet (10 mg total) by mouth 3 (three) times daily. 01/11/18  Yes Michael Barrack, MD  diclofenac (VOLTAREN) 50 MG EC tablet Take 50 mg by mouth 2 (two) times daily.   Yes [provider]  doxycycline (VIBRA-TABS) 100 MG tablet Take 1 tablet (100 mg  total) by mouth 2 (two) times daily. 01/11/18  Yes Michael Barrack, MD  folic acid (FOLVITE) 1 MG tablet Take 1 tablet (1 mg total) by mouth daily. 07/24/17  Yes Emokpae, Courage, MD  nadolol (CORGARD) 20 MG tablet Take 1 tablet (20 mg total) by mouth daily. 11/12/17  Yes Michael Barrack, MD  nortriptyline (PAMELOR) 75 MG capsule Take 2 capsules (150 mg total) by mouth at bedtime. 12/28/17  Yes Michael Barrack, MD  Tiotropium Bromide Monohydrate (SPIRIVA RESPIMAT) 2.5 MCG/ACT AERS Inhale 2 puffs into the lungs daily. 12/28/17  Yes Michael Barrack, MD  feeding supplement, ENSURE ENLIVE, (ENSURE ENLIVE) LIQD Take 237 mLs by mouth 2 (two) times daily between meals. Patient not taking: Reported on 02/10/2018 06/18/17   Kayleen Memos, DO  ipratropium (ATROVENT) 0.06 % nasal spray Place 2 sprays into both nostrils 4 (four) times daily. Patient not taking: Reported on 02/12/2018 11/12/17   Michael Barrack, MD  lactulose [facility]) 10 GM/15ML solution Take 64m one to three times daily to titrate for 2-3 soft stools per day Patient not taking: Reported on 02/13/2018 11/12/17   PVivi Barrack MD  Multiple Vitamin (MULTIVITAMIN WITH MINERALS) TABS tablet Take 1 tablet by mouth daily. Patient not taking: Reported on 02/22/2018 07/25/17   ERoxan Hockey MD  nystatin (MYCOSTATIN/NYSTOP) powder Apply topically 4 (four) times daily. Patient not taking: Reported on 02/25/2018 11/12/17   PVivi Barrack MD  pantoprazole (PROTONIX) 40 MG tablet Take 1 tablet (40 mg total) by mouth daily at 12 noon. Patient not taking: Reported on 02/11/2018 07/08/17   PVivi Barrack MD  thiamine 100 MG tablet Take 1 tablet (100 mg total) by mouth daily. Patient not taking: Reported on 02/14/2018 01/04/18   NMariel Aloe MD    Physical Exam:  Physical Exam  Constitutional: He appears well-developed and well-nourished. No distress.  HENT:  Mouth/Throat: Oropharynx is clear and moist.  Eyes: Pupils are equal, round, and reactive to  light. Conjunctivae and EOM are normal. Scleral icterus is present. Right eye exhibits no nystagmus. Left eye exhibits no nystagmus.  Neck: Normal range of motion.  Cardiovascular: Normal rate, regular rhythm and normal heart sounds.  No murmur heard. Pulmonary/Chest: Effort normal and breath sounds normal. No respiratory distress. He has no wheezes. He has no rales.  Abdominal: Soft. Bowel sounds are normal. He exhibits distension (mildly). There is no tenderness. There is no rebound and no guarding.  Musculoskeletal: Normal range of motion. He exhibits no edema or tenderness.       Right lower leg: He exhibits no edema.       Left lower leg: He exhibits no edema.  Lymphadenopathy:    He has cervical adenopathy (painful).  Neurological: He is alert. He displays tremor. He displays no seizure activity.  Difficult to assess orientation secondary to communication difficulties. Follows commands.  Skin: Skin is warm and dry. Ecchymosis (right forearm) noted. He is not diaphoretic.  Mild jaundice  Labs on Admission: I have personally reviewed following labs and imaging studies  CBC: Recent Labs  Lab 01/27/18 1123 01/30/2018 0750  WBC 12.7* 13.8*  NEUTROABS 10.1* 11.5*  HGB 14.9 14.7  HCT 44.4 45.2  MCV 97.8 99.6  PLT 110* 126*    Basic Metabolic Panel: Recent Labs  Lab 01/27/18 1123 01/30/2018 0750  NA 137 137  K 3.9 3.7  CL 100 100  CO2 27 25  GLUCOSE 128* 122*  BUN 13 15  CREATININE 0.87 0.83  CALCIUM 11.1* 10.9*    GFR: Estimated Creatinine Clearance: 117.7 mL/min (by C-G formula based on SCr of 0.83 mg/dL).  Liver Function Tests: Recent Labs  Lab 01/27/18 1123 02/14/2018 0750  AST 52* 80*  ALT 17 26  ALKPHOS 180* 142*  BILITOT 4.2* 4.5*  PROT 7.2 7.2  ALBUMIN 3.0* 3.3*   Recent Labs  Lab 02/05/2018 0750  LIPASE 44   Recent Labs  Lab 02/03/2018 0750  AMMONIA 34    Coagulation Profile: Recent Labs  Lab 02/23/2018 0750  INR 1.46    Cardiac  Enzymes: Recent Labs  Lab 02/09/2018 0748 02/14/2018 0750  CKTOTAL 1,517*  --   TROPONINI  --  0.03*    BNP (last 3 results) No results for input(s): PROBNP in the last 8760 hours.  HbA1C: No results for input(s): HGBA1C in the last 72 hours.  CBG: Recent Labs  Lab 01/21/18 1349  GLUCAP 77    Lipid Profile: No results for input(s): CHOL, HDL, LDLCALC, TRIG, CHOLHDL, LDLDIRECT in the last 72 hours.  Thyroid Function Tests: No results for input(s): TSH, T4TOTAL, FREET4, T3FREE, THYROIDAB in the last 72 hours.  Anemia Panel: No results for input(s): VITAMINB12, FOLATE, FERRITIN, TIBC, IRON, RETICCTPCT in the last 72 hours.  Urine analysis:    Component Value Date/Time   COLORURINE AMBER (A) 02/16/2018 0723   APPEARANCEUR HAZY (A) 02/05/2018 0723   LABSPEC 1.031 (H) 02/04/2018 0723   PHURINE 5.0 02/10/2018 0723   GLUCOSEU NEGATIVE 01/29/2018 0723   HGBUR NEGATIVE 02/02/2018 0723   BILIRUBINUR NEGATIVE 01/29/2018 0723   KETONESUR 5 (A) 02/02/2018 0723   PROTEINUR 100 (A) 02/20/2018 0723   NITRITE NEGATIVE 02/03/2018 0723   LEUKOCYTESUR NEGATIVE 02/02/2018 0723     Radiological Exams on Admission: Ct Head Wo Contrast  Result Date: 02/05/2018 CLINICAL DATA:  Weakness. EXAM: CT HEAD WITHOUT CONTRAST TECHNIQUE: Contiguous axial images were obtained from the base of the skull through the vertex without intravenous contrast. COMPARISON:  MRI 01/21/2018 FINDINGS: Brain: No acute intracranial abnormality. Specifically, no hemorrhage, hydrocephalus, mass lesion, acute infarction, or significant intracranial injury. Vascular: No hyperdense vessel or unexpected calcification. Skull: No acute calvarial abnormality. Sinuses/Orbits: Mucosal thickening in the paranasal sinuses. No air-fluid levels. Other: None IMPRESSION: No acute intracranial abnormality. Chronic sinusitis. Electronically Signed   By: Rolm Baptise M.D.   On: 02/17/2018 09:26   Dg Chest Port 1 View  Result Date:  02/05/2018 CLINICAL DATA:  Lung carcinoma with weakness EXAM: PORTABLE CHEST 1 VIEW COMPARISON:  PET-CT January 21, 2018; chest radiograph January 01, 2018 FINDINGS: There is a small left pleural effusion. There is interstitial prominence in the lung bases which may represent edema or possibly lymphangitic spread of tumor. Multiple nodular opacities seen on recent PET study are less well seen by radiography. There is no frank airspace consolidation. Heart is mildly enlarged with pulmonary vascularity normal. Adenopathy seen on CT is present but less well seen by radiography. There is aortic atherosclerosis. There  is postoperative change in the lower cervical region. IMPRESSION: Cardiomegaly. Small left pleural effusion. Pilar Plate consolidation. Interstitial prominence in the bases may represent edema or potential lymphangitic spread of tumor. Multiple nodular lesions seen on recent PET study are not well seen by radiography. There is aortic atherosclerosis. Adenopathy is better appreciated on recent CT. Aortic Atherosclerosis (ICD10-I70.0). Electronically Signed   By: Lowella Grip III M.D.   On: 02/09/2018 08:03    EKG: Independently reviewed. Sinus tachycardia  Assessment/Plan Active Problems:   Alcohol withdrawal (HCC)  Alcohol withdrawal Patient with an initial CIWA score of 15. Last drink was approximately 4 days ago.assocaited agitation. Maybe some mild delirium tremens. Question possible withdrawal seizure, although nothing witness per family. Fall not witnessed by family. -CIWA, thiamine, folate -Alcohol level  Non-small cell cancer of the lung Metastatic. Associated vocal cord paralysis. Plan for XRT per Dr. Julien Nordmann. Considering chemotherapy if liver function improves with cessation of alcohol -When withdrawal improved, would consult radiation oncology for evaluation while inpatient -Palliative care consult pending  Acute respiratory failure Unknown etiology. Chest x-ray suggests  pulmonary edema, but on review, appears similar to prior chest x-ray. CT scan at previous hospitalization was significant for numerous pulmonary nodules with no evidence of pulmonary edema. Patient also is a smoker and continues to smoke. -Continue supplemental oxygen  -Wean as able -Watch for worsening respiratory function in setting of hydration -Discontinue antibiotics -Blood cultures pending  Dehydration On physical exam, patient appears dehydrated. History of poor oral intake would support this finding. Chest x-ray findings similar to previous and likely represent lung disease rather than pulmonary edema -IV fluids -Watch respiratory status  Rhabdomyolysis Secondary to patient being down overnight. CK mildly elevated. IV fluids as mentioned above. -Repeat CK in AM  Dysphagia Secondary to significant adenopathy. Patient has decreased oral intake and weight loss. Seen by SLP on previous admission with recommendations for regular diet and thin liquids. Discussed briefly role of artificial nutrition in setting of unsafe oral intake. If able to undergo chemotherapy/XRT, could be an option in the interim. Palliative care discussions pending. -SLP consult -Dietician consult  Neck pain Secondary to metastatic disease -Morphine prn -Hold baclofen while NPO; this was started recently  COPD Not in exacerbation on exam. Had some mild wheezing on arrival to the ED but none prior to admission after nebulizer treatment. -Continue albuterol, Spiriva  Alcoholic cirrhosis Patient continues to drink. Per family, he tries to self-medicate himself in setting of significant neck pain. Discriminant function of 30.3. Bilirubin elevated and stable. INR elevated. -Daily weights  Esophageal varices -Holding nadolol while NPO  Thrombocytopenia Relatively mild. Stable. In setting of alcoholic liver disease  Sepsis Met criteria on arrival to ED. Received antibiotics but no fluids. Derangements likely  secondary to alcohol withdrawal. Mildly elevated WBC. Blood cultures obtained. Did not obtain procalcitonin secondary to contraindications -Hold antibiocis and observe -Blood cultures pending   DVT prophylaxis: SCDs Code Status: DNR Family Communication: Sister, brother in Sports coach, nephew at bedside Disposition Plan: Stepdown unit Consults called: CCM, Palliative care medicine Admission status: Inpatient   Cordelia Poche, MD [facility] 02/15/2018, 11:37 AM  If 7PM-7AM, please contact night-coverage [email or web address] Password [email or web address]

## 2018-01-28 NOTE — ED Triage Notes (Addendum)
Patient was feeling weak yesterday. Patient slip to the floor and family let him sleep. Patient has stage 4 lung cancer and cirrhosis. Patient is not able to well due to cancer in his throat. pateint was 85% on room air. Patient is being given duol neb and o2 sat 99%. Patient brought in by [place]. Info comes from the family that lives with the patient. Patient is also complaining of abdominal pain.

## 2018-01-28 NOTE — Progress Notes (Signed)
PMT RN Note: Consult order noted. PMT is experiencing very high consult volume and will be staffed with emergency coverage only on 11/1 due to the team being out of the office at the [facility].   If you need interim recommendations, please call [phone] to leave a message for the nurse. All medical emergencies should be directed to the attending physician.   Once there is an available provider (likely 11/2 or 11/3 due to weekend staffing), this patient will be evaluated and seen by our team.  Marjie Skiff. Shakil Dirk, RN, BSN, [facility] Palliative Medicine Team 02/10/2018 8:39 AM Office [phone]

## 2018-01-28 NOTE — Consult Note (Signed)
NAME:  Michael Mcfarland, MRN:  [id number], DOB:  July 28, 1965, LOS: 0 ADMISSION DATE:  01/30/2018, CONSULTATION DATE:  02/16/2018 REFERRING MD:  EDP - Nan[place]ati, CHIEF COMPLAINT:  Altered mental status   Brief History   52 year old male alcoholic with recent diagnosis of metastatic lung cancer and alcoholic cirrhosis who presents to PCCM with alcohol withdrawal.  He started feeling weak yesterday and was allowed to sleep.  However, when found by family this AM with AMS and evidence of withdrawal patient was brought to the ED.  In the ED, he was found to be hypoxemic to 85% on RA that responded to nebs.  Spoke with family, patient has a living will stating a full DNR.  Past Medical History  Met lung cancer Alcoholic cirrhosis DTs  Significant Hospital Events   11/1 hospital admission  Consults: date of consult/date signed off & final recs:  PCCM for etoh withdrawal and risk of respiratory failure  Procedures (surgical and bedside):  None  Significant Diagnostic Tests:  None  Micro Data:  Blood 11/1>>> Urine 11/1>>>  Antimicrobials:  Cefepime 11/1>>> Vancomycin 11/1>>>   Subjective:  Unable to speak No events since admission  Objective   Blood pressure 102/90, pulse (!) 106, temperature 98.3 F (36.8 C), temperature source Oral, resp. rate (!) 34, height 5' 9"  (1.753 m), weight 98.4 kg, SpO2 92 %.        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  Discussed with EDP.  Hypoxemia: mets and pleural effusion  - Titrate O2 for sat of 88-92%  HCAP:  - Cefepime  - Vanc  - Pan cultures  Pulmonary edema:  - Diureses as BP allows  GOC:  - Full DNR, no intubation  Disposition / Summary of Today's Plan 02/16/2018   Ok to admit to SDU, PCCM will sign off  Labs   CBC: Recent Labs  Lab 01/27/18 1123 02/20/2018 0750  WBC 12.7* 13.8*  NEUTROABS 10.1* 11.5*  HGB 14.9 14.7  HCT 44.4 45.2  MCV 97.8 99.6  PLT 110* 977*   Basic Metabolic Panel: Recent Labs  Lab 01/27/18 1123 02/24/2018 0750  NA 137 137  K 3.9 3.7  CL 100 100  CO2 27 25  GLUCOSE 128* 122*  BUN 13 15  CREATININE 0.87 0.83  CALCIUM 11.1* 10.9*   GFR: Estimated Creatinine Clearance: 117.7 mL/min (by C-G formula based on SCr of 0.83 mg/dL). Recent Labs  Lab 01/27/18 1123 02/08/2018 0750 02/14/2018 0752  WBC 12.7* 13.8*  --   LATICACIDVEN  --   --  2.26*    Liver Function Tests: Recent Labs  Lab 01/27/18 1123 02/23/2018  0750  AST 52* 80*  ALT 17 26  ALKPHOS 180* 142*  BILITOT 4.2* 4.5*  PROT 7.2 7.2  ALBUMIN 3.0* 3.3*   Recent Labs  Lab 02/14/2018 0750  LIPASE 44   Recent Labs  Lab 02/04/2018 0750  AMMONIA 34    ABG    Component Value Date/Time   PHART 7.431 02/02/2018 0804   PCO2ART 41.2 02/23/2018 0804   PO2ART 58.3 (L) 02/09/2018 0804   HCO3 27.0 02/17/2018 0804   ACIDBASEDEF 2.9 (H) 02/09/2018 0804   O2SAT 88.9 01/30/2018 0804     Coagulation Profile: Recent Labs  Lab 02/07/2018 0750  INR 1.46    Cardiac Enzymes: Recent Labs  Lab 02/05/2018 0748 02/22/2018 0750  CKTOTAL 1,517*  --   TROPONINI  --  0.03*    HbA1C: Hgb A1c MFr Bld  Date/Time Value  Ref Range Status  02/10/2017 02:00 PM 5.6 <5.7 % of total Hgb Final    Comment:    For the purpose of screening for the presence of diabetes: . <5.7%       Consistent with the absence of diabetes 5.7-6.4%    Consistent with increased risk for diabetes             (prediabetes) > or =6.5%  Consistent with diabetes . This assay result is consistent with a decreased risk of diabetes. . Currently, no consensus exists regarding use of hemoglobin A1c for diagnosis of diabetes in children. . According to American Diabetes Association (ADA) guidelines, hemoglobin A1c <7.0% represents optimal control in non-pregnant diabetic patients. Different metrics may apply to specific patient populations.  Standards of Medical Care in Diabetes(ADA). .     CBG: Recent Labs  Lab 01/21/18 1349  GLUCAP 77    Admitting History of Present Illness.     Review of Systems:   Patient is unable to speak  Past Medical History  He,  has a past medical history of Alcohol abuse, Alcohol related seizure ([place]) (01/17/2017), Anxiety, Arthritis, Cirrhosis, alcoholic ([place]) (46/6599), COPD (chronic obstructive pulmonary disease) ([place]), Depression, Emphysema lung ([place]), Frequent headaches, GERD (gastroesophageal reflux disease), Hay fever, Hepatitis B, IBS (irritable bowel syndrome), Migraine, Panic attacks, Pneumonia, Portal hypertensive gastropathy ([place]), Seizure ([place]) (05/20/2017 X 2), and Stroke ([place]).   Surgical History    Past Surgical History:  Procedure Laterality Date  . ANTERIOR CERVICAL DECOMP/DISCECTOMY FUSION    . ESOPHAGOGASTRODUODENOSCOPY N/A 06/18/2017   Procedure: ESOPHAGOGASTRODUODENOSCOPY (EGD);  Surgeon: Doran Stabler, MD;  Location: [facility];  Service: Gastroenterology;  Laterality: N/A;  . LUMBAR DISC SURGERY       Social History   Social History   Socioeconomic History  . Marital status: Widowed    Spouse name: Not on file  . Number of children: 1  . Years of  education: Not on file  . Highest education level: Not on file  Occupational History  . Occupation: Dealer    Comment: self employed but stopped working ~ 2009-06-04 following death of wife and after having neck surgery.    Social Needs  . Financial resource strain: Not on file  . Food insecurity:    Worry: Not on file    Inability: Not on file  . Transportation needs:    Medical: Yes    Non-medical: Yes  Tobacco Use  . Smoking status: Current Some Day Smoker    Packs/day: 0.25    Years: 35.00    Pack years: 8.75    Types: Cigarettes  . Smokeless tobacco:  Never Used  . Tobacco comment: quit smoking after admission 04/2017  Substance and Sexual Activity  . Alcohol use: Not Currently    Alcohol/week: 18.0 standard drinks    Types: 18 Cans of beer per week    Comment: until admission 04/2017 drinking 12 to 18 beers daily.  since then, no beer or other ETOH (as of 06/17/17)  . Drug use: Not Currently    Comment: 05/20/2017 "been a long time; I tried different things when I was younger"  . Sexual activity: Not Currently    Comment: widowed in 2011  Lifestyle  . Physical activity:    Days per week: Not on file    Minutes per session: Not on file  . Stress: Not on file  Relationships  . Social connections:    Talks on phone: Not on file    Gets together: Not on file    Attends religious service: Not on file    Active member of club or organization: Not on file    Attends meetings of clubs or organizations: Not on file    Relationship status: Not on file  . Intimate partner violence:    Fear of current or ex partner: Not on file    Emotionally abused: Not on file    Physically abused: Not on file    Forced sexual activity: Not on file  Other Topics Concern  . Not on file  Social History Narrative  . Not on file  ,  reports that he has been smoking cigarettes. He has a 8.75 pack-year smoking history. He has never used smokeless tobacco. He reports that he drank about 18.0 standard  drinks of alcohol per week. He reports that he has current or past drug history.   Family History   His family history includes Arthritis in his father and mother; Depression in his mother; Diabetes in his mother and sister; Heart attack in his paternal grandfather; Heart disease in his brother and father; Hypertension in his mother; Liver disease in his sister.   Allergies No Known Allergies   Home Medications  Prior to Admission medications   Medication Sig Start Date End Date Taking? Authorizing Provider  albuterol (PROVENTIL HFA;VENTOLIN HFA) 108 (90 Base) MCG/ACT inhaler TAKE 2 PUFFS BY MOUTH EVERY 6 HOURS AS NEEDED FOR WHEEZE OR SHORTNESS OF BREATH Patient taking differently: Inhale 2 puffs into the lungs every 6 (six) hours as needed for wheezing or shortness of breath.  01/20/18  Yes [name], MD  baclofen (LIORESAL) 10 MG tablet Take 1 tablet (10 mg total) by mouth 3 (three) times daily. 01/11/18  Yes [name], MD  diclofenac (VOLTAREN) 50 MG EC tablet Take 50 mg by mouth 2 (two) times daily.   Yes [provider]  doxycycline (VIBRA-TABS) 100 MG tablet Take 1 tablet (100 mg total) by mouth 2 (two) times daily. 01/11/18  Yes [name], MD  folic acid (FOLVITE) 1 MG tablet Take 1 tablet (1 mg total) by mouth daily. 07/24/17  Yes Emokpae, Courage, MD  nadolol (CORGARD) 20 MG tablet Take 1 tablet (20 mg total) by mouth daily. 11/12/17  Yes [name], MD  nortriptyline (PAMELOR) 75 MG capsule Take 2 capsules (150 mg total) by mouth at bedtime. 12/28/17  Yes [name], MD  Tiotropium Bromide Monohydrate (SPIRIVA RESPIMAT) 2.5 MCG/ACT AERS Inhale 2 puffs into the lungs daily. 12/28/17  Yes [name], MD  feeding supplement, ENSURE ENLIVE, (ENSURE ENLIVE) LIQD Take  237 mLs by mouth 2 (two) times daily between meals. Patient not taking: Reported on 02/15/2018 06/18/17   Kayleen Memos, DO  ipratropium (ATROVENT) 0.06 % nasal spray Place 2 sprays into  both nostrils 4 (four) times daily. Patient not taking: Reported on 02/15/2018 11/12/17   [name], MD  lactulose [facility]) 10 GM/15ML solution Take 28m one to three times daily to titrate for 2-3 soft stools per day Patient not taking: Reported on 02/26/2018 11/12/17   PVivi Barrack MD  Multiple Vitamin (MULTIVITAMIN WITH MINERALS) TABS tablet Take 1 tablet by mouth daily. Patient not taking: Reported on 02/03/2018 07/25/17   ERoxan Hockey MD  nystatin (MYCOSTATIN/NYSTOP) powder Apply topically 4 (four) times daily. Patient not taking: Reported on 02/23/2018 11/12/17   PVivi Barrack MD  pantoprazole (PROTONIX) 40 MG tablet Take 1 tablet (40 mg total) by mouth daily at 12 noon. Patient not taking: Reported on 02/24/2018 07/08/17   PVivi Barrack MD  thiamine 100 MG tablet Take 1 tablet (100 mg total) by mouth daily. Patient not taking: Reported on 02/16/2018 01/04/18   NMariel Aloe MD    WRush Farmer M.D. LSur[facility] LLCPulmonary/Critical Care Medicine. Pager: [phone] After hours pager: [phone]

## 2018-01-28 NOTE — ED Notes (Addendum)
Post reading of blood gas; respiratory consulted with provider and decided to change pt to 4 lpm [place] instead of 3 lpm [place].   Provider aware of CIWA score and verbalizes will place order set for such.

## 2018-01-28 NOTE — ED Notes (Signed)
Bed: QN99 Expected date:  Expected time:  Means of arrival:  Comments: EMS 52 yo male from home generalized weakness that started yesterday-Stage IV lung cancer and cirrhosis of liver-wheezing/neb

## 2018-01-28 NOTE — Progress Notes (Signed)
ABG completed as ordered. PT on 3 lpm o2 nasal cannula, temperature 98.3, site right radial, Accession number [id number]. Below results given in printed and verbal format. PH 7.43 pco2 41.2 p02 58.3 (so2 88.9) cHco3 27.0  Per MD increase patient to 4 lpm- RN aware.

## 2018-01-28 NOTE — ED Notes (Signed)
ED TO INPATIENT HANDOFF REPORT  Name/Age/Gender Michael Mcfarland 52 y.o. male  Code Status    Code Status Orders  (From admission, onward)         Start     Ordered   02/24/2018 0801  Do not attempt resuscitation/DNR  Continuous    Question Answer Comment  In the event of cardiac or respiratory ARREST Do not call a "code blue"   In the event of cardiac or respiratory ARREST Do not perform Intubation, CPR, defibrillation or ACLS   In the event of cardiac or respiratory ARREST Use medication by any route, position, wound care, and other measures to relive pain and suffering. May use oxygen, suction and manual treatment of airway obstruction as needed for comfort.   Comments Ms. Mariann Laster is medical POA.      02/17/2018 0801        Code Status History    Date Active Date Inactive Code Status Order ID Comments User Context   01/01/2018 1851 01/03/2018 2027 Partial Code [id number]  Colbert Ewing, MD ED   07/18/2017 0952 07/24/2017 1834 Partial Code [id number]  Renee Pain, MD Inpatient   07/16/2017 2333 07/18/2017 0952 Full Code [id number]  Etta Quill, DO ED   06/15/2017 2107 06/22/2017 1555 Full Code [id number]  Ivor Costa, MD ED   05/20/2017 2037 05/23/2017 1610 Full Code [id number]  Jani Gravel, MD Inpatient   01/17/2017 2151 01/20/2017 1902 Full Code [id number]  Etta Quill, DO ED      Home/SNF/Other Home  Chief Complaint short of breath  Level of Care/Admitting Diagnosis ED Disposition    ED Disposition Condition [name] Area: [facility] [[id number]]  Level of Care: Stepdown [14]  Admit to SDU based on following criteria: Severe physiological/psychological symptoms:  Any diagnosis requiring assessment & intervention at least every 4 hours on an ongoing basis to obtain desired patient outcomes including stability and rehabilitation  Diagnosis: Alcohol withdrawal ([place]) [291.81.ICD-9-CM]  Admitting Physician: Mariel Aloe [[id number]]  Attending  Physician: Mariel Aloe [phone]  Estimated length of stay: past midnight tomorrow  Certification:: I certify this patient will need inpatient services for at least 2 midnights  PT Class (Do Not Modify): Inpatient [101]  PT Acc Code (Do Not Modify): Private [1]       Medical History Past Medical History:  Diagnosis Date  . Alcohol abuse    /notes 01/18/2017  . Alcohol related seizure ([place]) 01/17/2017   Archie Endo 01/17/2017  . Anxiety   . Arthritis   . Cirrhosis, alcoholic ([place]) 32/3557  . COPD (chronic obstructive pulmonary disease) ([place])   . Depression   . Emphysema lung ([place])   . Frequent headaches   . GERD (gastroesophageal reflux disease)   . Hay fever   . Hepatitis B   . IBS (irritable bowel syndrome)   . Migraine    frequency "depends on the weather" (01/19/2017)  . Panic attacks   . Pneumonia   . Portal hypertensive gastropathy ([place])   . Seizure ([place]) 05/20/2017 X 2  . Stroke [facility])     Allergies No Known Allergies  IV Location/Drains/Wounds Patient Lines/Drains/Airways Status   Active Line/Drains/Airways    Name:   Placement date:   Placement time:   Site:   Days:   Peripheral IV 02/18/2018 Left Forearm   02/09/2018    0755    Forearm   less than 1   Peripheral IV 02/14/2018 Right Hand  02/24/2018    0806    Hand   less than 1          Labs/Imaging Results for orders placed or performed during the hospital encounter of 01/30/2018 (from the past 48 hour(s))  Urinalysis, Routine w reflex microscopic     Status: Abnormal   Collection Time: 02/25/2018  7:23 AM  Result Value Ref Range   Color, Urine AMBER (A) YELLOW    Comment: BIOCHEMICALS MAY BE AFFECTED BY COLOR   APPearance HAZY (A) CLEAR   Specific Gravity, Urine 1.031 (H) 1.005 - 1.030   pH 5.0 5.0 - 8.0   Glucose, UA NEGATIVE NEGATIVE mg/dL   Hgb urine dipstick NEGATIVE NEGATIVE   Bilirubin Urine NEGATIVE NEGATIVE   Ketones, ur 5 (A) NEGATIVE mg/dL   Protein, ur 100 (A) NEGATIVE mg/dL   Nitrite NEGATIVE  NEGATIVE   Leukocytes, UA NEGATIVE NEGATIVE   RBC / HPF 0-5 0 - 5 RBC/hpf   WBC, UA 0-5 0 - 5 WBC/hpf   Bacteria, UA NONE SEEN NONE SEEN   Squamous Epithelial / LPF 0-5 0 - 5   Mucus PRESENT     Comment: Performed at [facility], [place] [address] [place], [place] [address]  CK     Status: Abnormal   Collection Time: 02/08/2018  7:48 AM  Result Value Ref Range   Total CK 1,517 (H) 49 - 397 U/L    Comment: Performed at [facility], [place] [address] [place] [address], [place], [place] [address]  Comprehensive metabolic panel     Status: Abnormal   Collection Time: 02/08/2018  7:50 AM  Result Value Ref Range   Sodium 137 135 - 145 mmol/L   Potassium 3.7 3.5 - 5.1 mmol/L   Chloride 100 98 - 111 mmol/L   CO2 25 22 - 32 mmol/L   Glucose, Bld 122 (H) 70 - 99 mg/dL   BUN 15 6 - 20 mg/dL   Creatinine, Ser 0.83 0.61 - 1.24 mg/dL   Calcium 10.9 (H) 8.9 - 10.3 mg/dL   Total Protein 7.2 6.5 - 8.1 g/dL   Albumin 3.3 (L) 3.5 - 5.0 g/dL   AST 80 (H) 15 - 41 U/L   ALT 26 0 - 44 U/L   Alkaline Phosphatase 142 (H) 38 - 126 U/L   Total Bilirubin 4.5 (H) 0.3 - 1.2 mg/dL   GFR calc non Af Amer >60 >60 mL/min   GFR calc Af Amer >60 >60 mL/min    Comment: (NOTE) The eGFR has been calculated using the CKD EPI equation. This calculation has not been validated in all clinical situations. eGFR's persistently <60 mL/min signify possible Chronic Kidney Disease.    Anion gap 12 5 - 15    Comment: Performed at [facility], [place] [address] [place], [place] [address]  CBC WITH DIFFERENTIAL     Status: Abnormal   Collection Time: 02/02/2018  7:50 AM  Result Value Ref Range   WBC 13.8 (H) 4.0 - 10.5 K/uL   RBC 4.54 4.22 - 5.81 MIL/uL   Hemoglobin 14.7 13.0 - 17.0 g/dL   HCT 45.2 39.0 - 52.0 %   MCV 99.6 80.0 - 100.0 fL   MCH 32.4 26.0 - 34.0 pg   MCHC 32.5 30.0 - 36.0 g/dL   RDW 14.2 11.5 - 15.5 %   Platelets 126 (L) 150 - 400 K/uL   nRBC 0.0 0.0 - 0.2 %   Neutrophils  Relative % 84 %  Neutro Abs 11.5 (H) 1.7 - 7.7 K/uL   Lymphocytes Relative 7 %   Lymphs Abs 1.0 0.7 - 4.0 K/uL   Monocytes Relative 8 %   Monocytes Absolute 1.1 (H) 0.1 - 1.0 K/uL   Eosinophils Relative 0 %   Eosinophils Absolute 0.0 0.0 - 0.5 K/uL   Basophils Relative 0 %   Basophils Absolute 0.1 0.0 - 0.1 K/uL   Immature Granulocytes 1 %   Abs Immature Granulocytes 0.16 (H) 0.00 - 0.07 K/uL    Comment: Performed at [facility], [place] [address], [place], [place] [address]  APTT     Status: None   Collection Time: 02/05/2018  7:50 AM  Result Value Ref Range   aPTT 32 24 - 36 seconds    Comment: Performed at [facility], [place] [address] [place], [place] [address]  Protime-INR     Status: Abnormal   Collection Time: 02/13/2018  7:50 AM  Result Value Ref Range   Prothrombin Time 17.6 (H) 11.4 - 15.2 seconds   INR 1.46     Comment: Performed at [facility], [place] [address], [place], [place] [address]  Troponin I     Status: Abnormal   Collection Time: 02/09/2018  7:50 AM  Result Value Ref Range   Troponin I 0.03 (HH) <0.03 ng/mL    Comment: CRITICAL RESULT CALLED TO, READ BACK BY AND VERIFIED WITHMarisa Hua RN AT 0901 02/14/2018 MULLINS,T Performed at [facility], [place] [address], [place], [place] [address]   Brain natriuretic peptide     Status: None   Collection Time: 02/14/2018  7:50 AM  Result Value Ref Range   B Natriuretic Peptide 60.6 0.0 - 100.0 pg/mL    Comment: Performed at [facility], [place] [address] [place], [place] [address]  Lipase, blood     Status: None   Collection Time: 02/19/2018  7:50 AM  Result Value Ref Range   Lipase 44 11 - 51 U/L    Comment: Performed at [facility], [place] [address] [place], [place] [address]  Ammonia     Status: None   Collection Time: 01/30/2018  7:50 AM  Result Value Ref Range   Ammonia 34 9 - 35 umol/L    Comment: Performed  at [facility], [place] [address] [place], [place] [address]  I-Stat CG4 Lactic Acid, ED  (not at  [facility])     Status: Abnormal   Collection Time: 02/01/2018  7:52 AM  Result Value Ref Range   Lactic Acid, Venous 2.26 (HH) 0.5 - 1.9 mmol/L   Comment NOTIFIED PHYSICIAN   Blood gas, arterial ([facility], [facility], [facility])     Status: Abnormal   Collection Time: 01/29/2018  8:04 AM  Result Value Ref Range   O2 Content 3.0 L/min   pH, Arterial 7.431 7.350 - 7.450   pCO2 arterial 41.2 32.0 - 48.0 mmHg   pO2, Arterial 58.3 (L) 83.0 - 108.0 mmHg   Bicarbonate 27.0 20.0 - 28.0 mmol/L   Acid-Base Excess 2.8 (H) 0.0 - 2.0 mmol/L   Acid-base deficit 2.9 (H) 0.0 - 2.0 mmol/L   O2 Saturation 88.9 %   Patient temperature 98.3    Collection site RIGHT RADIAL    Drawn by [id number]    Sample type ARTERIAL DRAW    Allens test (pass/fail) PASS PASS    Comment: Performed at [facility], [place] [address] [place],  [address]  I-Stat CG4 Lactic Acid, ED  (not at  [facility])     Status: Abnormal   Collection Time: 02/10/2018  9:50 AM  Result Value Ref Range   Lactic Acid, Venous 2.03 (HH) 0.5 - 1.9 mmol/L   Comment NOTIFIED PHYSICIAN    Ct Head Wo Contrast  Result Date: 02/14/2018 CLINICAL DATA:  Weakness. EXAM: CT HEAD WITHOUT CONTRAST TECHNIQUE: Contiguous axial images were obtained from the base of the skull through the vertex without intravenous contrast. COMPARISON:  MRI 01/21/2018 FINDINGS: Brain: No acute intracranial abnormality. Specifically, no hemorrhage, hydrocephalus, mass lesion, acute infarction, or significant intracranial injury. Vascular: No hyperdense vessel or unexpected calcification. Skull: No acute calvarial abnormality. Sinuses/Orbits: Mucosal thickening in the paranasal sinuses. No air-fluid levels. Other: None IMPRESSION: No acute intracranial abnormality. Chronic sinusitis. Electronically Signed   By: Rolm Baptise M.D.   On: 02/15/2018 09:26   Dg Chest Port 1  View  Result Date: 02/23/2018 CLINICAL DATA:  Lung carcinoma with weakness EXAM: PORTABLE CHEST 1 VIEW COMPARISON:  PET-CT January 21, 2018; chest radiograph January 01, 2018 FINDINGS: There is a small left pleural effusion. There is interstitial prominence in the lung bases which may represent edema or possibly lymphangitic spread of tumor. Multiple nodular opacities seen on recent PET study are less well seen by radiography. There is no frank airspace consolidation. Heart is mildly enlarged with pulmonary vascularity normal. Adenopathy seen on CT is present but less well seen by radiography. There is aortic atherosclerosis. There is postoperative change in the lower cervical region. IMPRESSION: Cardiomegaly. Small left pleural effusion. Pilar Plate consolidation. Interstitial prominence in the bases may represent edema or potential lymphangitic spread of tumor. Multiple nodular lesions seen on recent PET study are not well seen by radiography. There is aortic atherosclerosis. Adenopathy is better appreciated on recent CT. Aortic Atherosclerosis (ICD10-I70.0). Electronically Signed   By: Lowella Grip III M.D.   On: 02/19/2018 08:03   EKG Interpretation  Date/Time:  Friday January 28 2018 07:32:17 EDT Ventricular Rate:  106 PR Interval:    QRS Duration: 103 QT Interval:  338 QTC Calculation: 449 R Axis:   -54 Text Interpretation:  Sinus tachycardia Borderline prolonged PR interval Probable left atrial enlargement LAD, consider left anterior fascicular block Probable anteroseptal infarct, old ST elevation, consider inferior injury No acute changes Confirmed by Varney Biles [phone]) on 02/17/2018 8:49:45 AM   Pending Labs Unresulted Labs (From admission, onward)    Start     Ordered   02/23/2018 0723  Blood Culture (routine x 2)  BLOOD CULTURE X 2,   STAT     02/24/2018 0724          Vitals/Pain Today's Vitals   02/16/2018 1021 02/17/2018 1031 02/05/2018 1100 02/22/2018 1130  BP: (!) 128/91 (!) 107/18  (!) 125/103 127/73  Pulse: 95 94 90 92  Resp:      Temp:      TempSrc:      SpO2: (!) 88% (!) 89% 91% 92%  Weight:      Height:      PainSc:        Isolation Precautions No active isolations  Medications Medications  0.9 %  sodium chloride infusion ( Intravenous New Bag/Given 02/14/2018 1134)  LORazepam (ATIVAN) injection 2-3 mg (2 mg Intravenous Given 29/7/98 9211)  folic acid injection 1 mg (has no administration in time range)  thiamine (B-1) injection 100 mg (100 mg Intravenous Given 02/02/2018 1129)  morphine 2 MG/ML injection 2-4 mg (has no administration  in time range)  MEDLINE mouth rinse (has no administration in time range)  ceFEPIme (MAXIPIME) 2 g in sodium chloride 0.9 % 100 mL IVPB (0 g Intravenous Stopped 02/07/2018 0833)  vancomycin (VANCOCIN) IVPB 1000 mg/200 mL premix ( Intravenous Stopped 02/15/2018 1047)  dexamethasone (DECADRON) injection 10 mg (10 mg Intravenous Given 02/20/2018 0812)    Mobility non-ambulatory

## 2018-01-28 NOTE — Telephone Encounter (Signed)
See note  Copied from [place] [phone]. Topic: General - Other >> Jan 28, 2018  2:25 PM Oneta Rack wrote: Relation to pt: [name] / daughter Call back number: [phone]    Reason for call:  Patient sister is requesting a letter for [employer] reflecting that she is her brother payee due to patient health condition. DOS refuses to speak with her unless she gets written documentation. Sister would like to pick up letter Monday 02-03-2018, please advise.

## 2018-01-28 NOTE — Progress Notes (Signed)
A consult was received from an ED physician for vancomycin and cefepime per pharmacy dosing (for an indication other than meningitis). The patient's profile has been reviewed for ht/wt/allergies/indication/available labs. A one time order has been placed for the above antibiotics.  Further antibiotics/pharmacy consults should be ordered by admitting physician if indicated.                       Reuel Boom, PharmD, BCPS [phone] 02/24/2018, 7:44 AM

## 2018-01-28 NOTE — Progress Notes (Signed)
Called Palliative office and left Michael Mcfarland [facility]) information with [name] to inform that she would not be able to come to hospital but to please call her for updates and decisions.

## 2018-01-28 NOTE — Telephone Encounter (Signed)
Patient currently hospitalized.  Have let sister know we will address issues when patient is released from hospital.  Palliative care will see patient in hospital.

## 2018-01-28 NOTE — ED Notes (Signed)
ED TO INPATIENT HANDOFF REPORT  Name/Age/Gender Michael Mcfarland 52 y.o. male  Code Status    Code Status Orders  (From admission, onward)         Start     Ordered   02/05/2018 1218  Do not attempt resuscitation (DNR)  Continuous    Question Answer Comment  In the event of cardiac or respiratory ARREST Do not call a "code blue"   In the event of cardiac or respiratory ARREST Do not perform Intubation, CPR, defibrillation or ACLS   In the event of cardiac or respiratory ARREST Use medication by any route, position, wound care, and other measures to relive pain and suffering. May use oxygen, suction and manual treatment of airway obstruction as needed for comfort.   Comments Ms. Mariann Laster is medical POA.      02/22/2018 1218        Code Status History    Date Active Date Inactive Code Status Order ID Comments User Context   01/29/2018 0801 02/12/2018 1218 DNR [id number]  Varney Biles, MD ED   01/01/2018 1851 01/03/2018 2027 Partial Code [id number]  Colbert Ewing, MD ED   07/18/2017 0952 07/24/2017 1834 Partial Code [id number]  Renee Pain, MD Inpatient   07/16/2017 2333 07/18/2017 0952 Full Code [id number]  Etta Quill, DO ED   06/15/2017 2107 06/22/2017 1555 Full Code [id number]  Ivor Costa, MD ED   05/20/2017 2037 05/23/2017 1610 Full Code [id number]  Jani Gravel, MD Inpatient   01/17/2017 2151 01/20/2017 1902 Full Code [id number]  Etta Quill, DO ED      Home/SNF/Other Home  Chief Complaint short of breath  Level of Care/Admitting Diagnosis ED Disposition    ED Disposition Condition [facility] Area: [facility] [[id number]]  Level of Care: Stepdown [14]  Admit to SDU based on following criteria: Severe physiological/psychological symptoms:  Any diagnosis requiring assessment & intervention at least every 4 hours on an ongoing basis to obtain desired patient outcomes including stability and rehabilitation  Diagnosis: Alcohol withdrawal ([place])  [291.81.ICD-9-CM]  Admitting Physician: Mariel Aloe [[id number]]  Attending Physician: Mariel Aloe [phone]  Estimated length of stay: past midnight tomorrow  Certification:: I certify this patient will need inpatient services for at least 2 midnights  PT Class (Do Not Modify): Inpatient [101]  PT Acc Code (Do Not Modify): Private [1]       Medical History Past Medical History:  Diagnosis Date  . Alcohol abuse    /notes 01/18/2017  . Alcohol related seizure ([place]) 01/17/2017   Archie Endo 01/17/2017  . Anxiety   . Arthritis   . Cirrhosis, alcoholic ([place]) 65/4650  . COPD (chronic obstructive pulmonary disease) ([place])   . Depression   . Emphysema lung ([place])   . Frequent headaches   . GERD (gastroesophageal reflux disease)   . Hay fever   . Hepatitis B   . IBS (irritable bowel syndrome)   . Migraine    frequency "depends on the weather" (01/19/2017)  . Panic attacks   . Pneumonia   . Portal hypertensive gastropathy ([place])   . Seizure ([place]) 05/20/2017 X 2  . Stroke [facility])     Allergies No Known Allergies  IV Location/Drains/Wounds Patient Lines/Drains/Airways Status   Active Line/Drains/Airways    Name:   Placement date:   Placement time:   Site:   Days:   Peripheral IV 02/07/2018 Left Forearm   02/24/2018    0755    Forearm  less than 1   Peripheral IV 02/02/2018 Right Hand   02/26/2018    0806    Hand   less than 1          Labs/Imaging Results for orders placed or performed during the hospital encounter of 01/30/2018 (from the past 48 hour(s))  Urinalysis, Routine w reflex microscopic     Status: Abnormal   Collection Time: 02/21/2018  7:23 AM  Result Value Ref Range   Color, Urine AMBER (A) YELLOW    Comment: BIOCHEMICALS MAY BE AFFECTED BY COLOR   APPearance HAZY (A) CLEAR   Specific Gravity, Urine 1.031 (H) 1.005 - 1.030   pH 5.0 5.0 - 8.0   Glucose, UA NEGATIVE NEGATIVE mg/dL   Hgb urine dipstick NEGATIVE NEGATIVE   Bilirubin Urine NEGATIVE NEGATIVE   Ketones, ur 5 (A)  NEGATIVE mg/dL   Protein, ur 100 (A) NEGATIVE mg/dL   Nitrite NEGATIVE NEGATIVE   Leukocytes, UA NEGATIVE NEGATIVE   RBC / HPF 0-5 0 - 5 RBC/hpf   WBC, UA 0-5 0 - 5 WBC/hpf   Bacteria, UA NONE SEEN NONE SEEN   Squamous Epithelial / LPF 0-5 0 - 5   Mucus PRESENT     Comment: Performed at [facility], [place] [address], [place], [place] [address]  CK     Status: Abnormal   Collection Time: 02/20/2018  7:48 AM  Result Value Ref Range   Total CK 1,517 (H) 49 - 397 U/L    Comment: Performed at [facility], [place] [address], [place], [place] [address]  Comprehensive metabolic panel     Status: Abnormal   Collection Time: 02/12/2018  7:50 AM  Result Value Ref Range   Sodium 137 135 - 145 mmol/L   Potassium 3.7 3.5 - 5.1 mmol/L   Chloride 100 98 - 111 mmol/L   CO2 25 22 - 32 mmol/L   Glucose, Bld 122 (H) 70 - 99 mg/dL   BUN 15 6 - 20 mg/dL   Creatinine, Ser 0.83 0.61 - 1.24 mg/dL   Calcium 10.9 (H) 8.9 - 10.3 mg/dL   Total Protein 7.2 6.5 - 8.1 g/dL   Albumin 3.3 (L) 3.5 - 5.0 g/dL   AST 80 (H) 15 - 41 U/L   ALT 26 0 - 44 U/L   Alkaline Phosphatase 142 (H) 38 - 126 U/L   Total Bilirubin 4.5 (H) 0.3 - 1.2 mg/dL   GFR calc non Af Amer >60 >60 mL/min   GFR calc Af Amer >60 >60 mL/min    Comment: (NOTE) The eGFR has been calculated using the CKD EPI equation. This calculation has not been validated in all clinical situations. eGFR's persistently <60 mL/min signify possible Chronic Kidney Disease.    Anion gap 12 5 - 15    Comment: Performed at [facility], [place] [address], [place], [place] [address]  CBC WITH DIFFERENTIAL     Status: Abnormal   Collection Time: 02/12/2018  7:50 AM  Result Value Ref Range   WBC 13.8 (H) 4.0 - 10.5 K/uL   RBC 4.54 4.22 - 5.81 MIL/uL   Hemoglobin 14.7 13.0 - 17.0 g/dL   HCT 45.2 39.0 - 52.0 %   MCV 99.6 80.0 - 100.0 fL   MCH 32.4 26.0 - 34.0 pg   MCHC 32.5 30.0 - 36.0 g/dL   RDW 14.2 11.5 - 15.5 %    Platelets 126 (L) 150 - 400 K/uL   nRBC 0.0  0.0 - 0.2 %   Neutrophils Relative % 84 %   Neutro Abs 11.5 (H) 1.7 - 7.7 K/uL   Lymphocytes Relative 7 %   Lymphs Abs 1.0 0.7 - 4.0 K/uL   Monocytes Relative 8 %   Monocytes Absolute 1.1 (H) 0.1 - 1.0 K/uL   Eosinophils Relative 0 %   Eosinophils Absolute 0.0 0.0 - 0.5 K/uL   Basophils Relative 0 %   Basophils Absolute 0.1 0.0 - 0.1 K/uL   Immature Granulocytes 1 %   Abs Immature Granulocytes 0.16 (H) 0.00 - 0.07 K/uL    Comment: Performed at [facility], [place] [address] [place], [place] [address]  APTT     Status: None   Collection Time: 02/03/2018  7:50 AM  Result Value Ref Range   aPTT 32 24 - 36 seconds    Comment: Performed at [facility], [place] [address], [place], [place] [address]  Protime-INR     Status: Abnormal   Collection Time: 02/26/2018  7:50 AM  Result Value Ref Range   Prothrombin Time 17.6 (H) 11.4 - 15.2 seconds   INR 1.46     Comment: Performed at [facility], [place] [address], [place], [place] [address]  Troponin I     Status: Abnormal   Collection Time: 02/06/2018  7:50 AM  Result Value Ref Range   Troponin I 0.03 (HH) <0.03 ng/mL    Comment: CRITICAL RESULT CALLED TO, READ BACK BY AND VERIFIED WITHMarisa Hua RN AT 0901 02/22/2018 MULLINS,T Performed at [facility], [place] [address], [place], [place] [address]   Brain natriuretic peptide     Status: None   Collection Time: 01/30/2018  7:50 AM  Result Value Ref Range   B Natriuretic Peptide 60.6 0.0 - 100.0 pg/mL    Comment: Performed at [facility], [place], [place] [address] [place], [place] [address]  Lipase, blood     Status: None   Collection Time: 02/05/2018  7:50 AM  Result Value Ref Range   Lipase 44 11 - 51 U/L    Comment: Performed at [facility], [place] [address] [place], [place] [address]  Ammonia     Status: None   Collection Time: 02/26/2018  7:50 AM   Result Value Ref Range   Ammonia 34 9 - 35 umol/L    Comment: Performed at [facility], [place] [address], [place], [place] [address]  I-Stat CG4 Lactic Acid, ED  (not at  [facility])     Status: Abnormal   Collection Time: 01/29/2018  7:52 AM  Result Value Ref Range   Lactic Acid, Venous 2.26 (HH) 0.5 - 1.9 mmol/L   Comment NOTIFIED PHYSICIAN   Blood gas, arterial ([facility], [facility], [facility])     Status: Abnormal   Collection Time: 02/17/2018  8:04 AM  Result Value Ref Range   O2 Content 3.0 L/min   pH, Arterial 7.431 7.350 - 7.450   pCO2 arterial 41.2 32.0 - 48.0 mmHg   pO2, Arterial 58.3 (L) 83.0 - 108.0 mmHg   Bicarbonate 27.0 20.0 - 28.0 mmol/L   Acid-Base Excess 2.8 (H) 0.0 - 2.0 mmol/L   Acid-base deficit 2.9 (H) 0.0 - 2.0 mmol/L   O2 Saturation 88.9 %   Patient temperature 98.3    Collection site RIGHT RADIAL    Drawn by [id number]    Sample type ARTERIAL DRAW    Allens test (pass/fail) PASS PASS    Comment:  Performed at [facility], [place] [address] [place], [place] [address]  I-Stat CG4 Lactic Acid, ED  (not at  [facility])     Status: Abnormal   Collection Time: 02/18/2018  9:50 AM  Result Value Ref Range   Lactic Acid, Venous 2.03 (HH) 0.5 - 1.9 mmol/L   Comment NOTIFIED PHYSICIAN    Ct Head Wo Contrast  Result Date: 02/11/2018 CLINICAL DATA:  Weakness. EXAM: CT HEAD WITHOUT CONTRAST TECHNIQUE: Contiguous axial images were obtained from the base of the skull through the vertex without intravenous contrast. COMPARISON:  MRI 01/21/2018 FINDINGS: Brain: No acute intracranial abnormality. Specifically, no hemorrhage, hydrocephalus, mass lesion, acute infarction, or significant intracranial injury. Vascular: No hyperdense vessel or unexpected calcification. Skull: No acute calvarial abnormality. Sinuses/Orbits: Mucosal thickening in the paranasal sinuses. No air-fluid levels. Other: None IMPRESSION: No acute intracranial abnormality. Chronic sinusitis. Electronically Signed    By: Rolm Baptise M.D.   On: 02/18/2018 09:26   Dg Chest Port 1 View  Result Date: 02/26/2018 CLINICAL DATA:  Lung carcinoma with weakness EXAM: PORTABLE CHEST 1 VIEW COMPARISON:  PET-CT January 21, 2018; chest radiograph January 01, 2018 FINDINGS: There is a small left pleural effusion. There is interstitial prominence in the lung bases which may represent edema or possibly lymphangitic spread of tumor. Multiple nodular opacities seen on recent PET study are less well seen by radiography. There is no frank airspace consolidation. Heart is mildly enlarged with pulmonary vascularity normal. Adenopathy seen on CT is present but less well seen by radiography. There is aortic atherosclerosis. There is postoperative change in the lower cervical region. IMPRESSION: Cardiomegaly. Small left pleural effusion. Pilar Plate consolidation. Interstitial prominence in the bases may represent edema or potential lymphangitic spread of tumor. Multiple nodular lesions seen on recent PET study are not well seen by radiography. There is aortic atherosclerosis. Adenopathy is better appreciated on recent CT. Aortic Atherosclerosis (ICD10-I70.0). Electronically Signed   By: Lowella Grip III M.D.   On: 02/08/2018 08:03   EKG Interpretation  Date/Time:  Friday January 28 2018 07:32:17 EDT Ventricular Rate:  106 PR Interval:    QRS Duration: 103 QT Interval:  338 QTC Calculation: 449 R Axis:   -54 Text Interpretation:  Sinus tachycardia Borderline prolonged PR interval Probable left atrial enlargement LAD, consider left anterior fascicular block Probable anteroseptal infarct, old ST elevation, consider inferior injury No acute changes Confirmed by Varney Biles [phone]) on 02/04/2018 8:49:45 AM   Pending Labs Unresulted Labs (From admission, onward)    Start     Ordered   01/29/18 0500  Comprehensive metabolic panel  Tomorrow morning,   R     02/15/2018 1218   01/29/18 0500  CBC  Tomorrow morning,   R     02/04/2018 1218    01/29/18 0500  Protime-INR  Tomorrow morning,   R     02/24/2018 1218   01/29/18 0500  CK  Tomorrow morning,   R     02/11/2018 1223   02/24/2018 0723  Blood Culture (routine x 2)  BLOOD CULTURE X 2,   STAT     02/21/2018 0724          Vitals/Pain Today's Vitals   02/15/2018 1031 02/25/2018 1100 02/10/2018 1130 02/24/2018 1240  BP: (!) 107/18 (!) 125/103 127/73 (!) 158/97  Pulse: 94 90 92 (!) 107  Resp:      Temp:      TempSrc:      SpO2: (!) 89% 91% 92%  Weight:      Height:      PainSc:        Isolation Precautions No active isolations  Medications Medications  0.9 %  sodium chloride infusion ( Intravenous New Bag/Given 02/18/2018 1134)  LORazepam (ATIVAN) injection 2-3 mg (2 mg Intravenous Given 63/9/43 2003)  folic acid injection 1 mg (has no administration in time range)  thiamine (B-1) injection 100 mg (100 mg Intravenous Given 02/17/2018 1129)  morphine 2 MG/ML injection 2-4 mg (has no administration in time range)  MEDLINE mouth rinse (has no administration in time range)  Tiotropium Bromide Monohydrate AERS 2 puff (has no administration in time range)  albuterol (PROVENTIL) (2.5 MG/3ML) 0.083% nebulizer solution 2.5 mg (2.5 mg Nebulization Given 02/10/2018 1241)  bisacodyl (DULCOLAX) suppository 10 mg (has no administration in time range)  ondansetron (ZOFRAN) tablet 4 mg (has no administration in time range)    Or  ondansetron (ZOFRAN) injection 4 mg (has no administration in time range)  ceFEPIme (MAXIPIME) 2 g in sodium chloride 0.9 % 100 mL IVPB (0 g Intravenous Stopped 02/08/2018 0833)  vancomycin (VANCOCIN) IVPB 1000 mg/200 mL premix ( Intravenous Stopped 02/20/2018 1047)  dexamethasone (DECADRON) injection 10 mg (10 mg Intravenous Given 02/08/2018 0812)    Mobility non-ambulatory

## 2018-01-29 ENCOUNTER — Inpatient Hospital Stay (HOSPITAL_COMMUNITY): Payer: Self-pay

## 2018-01-29 DIAGNOSIS — Z515 Encounter for palliative care: Secondary | ICD-10-CM

## 2018-01-29 DIAGNOSIS — C3490 Malignant neoplasm of unspecified part of unspecified bronchus or lung: Secondary | ICD-10-CM

## 2018-01-29 LAB — CBC
HEMATOCRIT: 44.2 % (ref 39.0–52.0)
HEMOGLOBIN: 14.1 g/dL (ref 13.0–17.0)
MCH: 32.3 pg (ref 26.0–34.0)
MCHC: 31.9 g/dL (ref 30.0–36.0)
MCV: 101.1 fL — AB (ref 80.0–100.0)
Platelets: 107 10*3/uL — ABNORMAL LOW (ref 150–400)
RBC: 4.37 MIL/uL (ref 4.22–5.81)
RDW: 14 % (ref 11.5–15.5)
WBC: 11.5 10*3/uL — ABNORMAL HIGH (ref 4.0–10.5)
nRBC: 0 % (ref 0.0–0.2)

## 2018-01-29 LAB — PROTIME-INR
INR: 1.7
Prothrombin Time: 19.8 seconds — ABNORMAL HIGH (ref 11.4–15.2)

## 2018-01-29 LAB — CK: Total CK: 986 U/L — ABNORMAL HIGH (ref 49–397)

## 2018-01-29 LAB — COMPREHENSIVE METABOLIC PANEL
ALBUMIN: 3.2 g/dL — AB (ref 3.5–5.0)
ALT: 28 U/L (ref 0–44)
AST: 81 U/L — AB (ref 15–41)
Alkaline Phosphatase: 133 U/L — ABNORMAL HIGH (ref 38–126)
Anion gap: 8 (ref 5–15)
BUN: 18 mg/dL (ref 6–20)
CHLORIDE: 103 mmol/L (ref 98–111)
CO2: 28 mmol/L (ref 22–32)
Calcium: 10.5 mg/dL — ABNORMAL HIGH (ref 8.9–10.3)
Creatinine, Ser: 0.69 mg/dL (ref 0.61–1.24)
GFR calc Af Amer: >60 mL/min (ref >60)
GFR calc non Af Amer: >60 mL/min (ref >60)
GLUCOSE: 115 mg/dL — AB (ref 70–99)
POTASSIUM: 3.9 mmol/L (ref 3.5–5.1)
SODIUM: 139 mmol/L (ref 135–145)
Total Bilirubin: 3.1 mg/dL — ABNORMAL HIGH (ref 0.3–1.2)
Total Protein: 6.9 g/dL (ref 6.5–8.1)

## 2018-01-29 LAB — GLUCOSE, CAPILLARY: GLUCOSE-CAPILLARY: 109 mg/dL — AB (ref 70–99)

## 2018-01-29 LAB — PROCALCITONIN: Procalcitonin: 0.27 ng/mL

## 2018-01-29 MED ORDER — ORAL CARE MOUTH RINSE
15.0000 mL | Freq: Two times a day (BID) | OROMUCOSAL | Status: DC
  Administered 2018-01-29 – 2018-01-30 (×4): 15 mL via OROMUCOSAL

## 2018-01-29 MED ORDER — DEXMEDETOMIDINE HCL IN NACL 200 MCG/50ML IV SOLN
0.4000 ug/kg/h | INTRAVENOUS | Status: DC
  Administered 2018-01-29: 0.9 ug/kg/h via INTRAVENOUS
  Administered 2018-01-29: 0.4 ug/kg/h via INTRAVENOUS
  Filled 2018-01-29 (×2): qty 50

## 2018-01-29 MED ORDER — MORPHINE SULFATE (PF) 2 MG/ML IV SOLN
2.0000 mg | Freq: Four times a day (QID) | INTRAVENOUS | Status: DC
  Administered 2018-01-29 – 2018-01-30 (×5): 2 mg via INTRAVENOUS
  Filled 2018-01-29 (×5): qty 1

## 2018-01-29 MED ORDER — DEXMEDETOMIDINE HCL IN NACL 400 MCG/100ML IV SOLN
0.4000 ug/kg/h | INTRAVENOUS | Status: DC
  Administered 2018-01-29: 0.9 ug/kg/h via INTRAVENOUS
  Administered 2018-01-29: 0.8 ug/kg/h via INTRAVENOUS
  Administered 2018-01-29: 0.7 ug/kg/h via INTRAVENOUS
  Administered 2018-01-30: 1.2 ug/kg/h via INTRAVENOUS
  Administered 2018-01-30: 1 ug/kg/h via INTRAVENOUS
  Administered 2018-01-30: 1.2 ug/kg/h via INTRAVENOUS
  Administered 2018-01-30: 1.1 ug/kg/h via INTRAVENOUS
  Filled 2018-01-29 (×7): qty 100

## 2018-01-29 MED ORDER — SODIUM CHLORIDE 0.9 % IV SOLN
1.5000 g | Freq: Four times a day (QID) | INTRAVENOUS | Status: DC
  Administered 2018-01-29 – 2018-01-31 (×7): 1.5 g via INTRAVENOUS
  Filled 2018-01-29 (×10): qty 1.5

## 2018-01-29 NOTE — Progress Notes (Signed)
PROGRESS NOTE    Michael Mcfarland  VQM:[id number] DOB: [birth year] DOA: 02/20/2018 PCP: [name], MD  Brief Narrative: 52 year old with past medical history relevant for alcohol abuse, gated by prior history of withdrawals, metastatic non-small cell lung cancer currently not eligible for treatment, vocal cord paralysis, COPD, alcoholic cirrhosis complicated by portal hypertensive gastropathy and grade 2 varices on endoscopy on 06/18/2017 as well as hepatic encephalopathy who presented on 02/24/2018 after being found down and altered.   Assessment & Plan:   Active Problems:   Alcohol withdrawal ([place])   #) Alcohol abuse/metabolic encephalopathy: At this time it is unclear why the patient is still profoundly encephalopathic though he is required multiple doses of lorazepam.  Since his head CT on admission was fairly unremarkable.  Last drink was on 01/24/2018.  Ammonia level was not elevated. - Continue CIWA protocol -Continue thiamine and folate supplementation - Start dexmedetomidine infusion for severe agitation  #) Pulmonary infiltrates: Unclear the patient has pneumonia or not.  Suspect there probably is a component of at least aspiration possibly related to vocal cord paralysis. -Start IV Unasyn -N.p.o. pending speech line which pathology evaluation and improve mental status -Procalcitonin ordered  #) Metastatic non-small cell lung cancer: Patient has extensive lymphadenopathy in the chest and is causing vocal cord paralysis due to this.  Per review of the last oncology note patient's sister who is healthcare power of attorney they were considering chemotherapy versus just palliative.  They have now decided just to transition completely to palliative. - Palliative care consult pending  #) Alcoholic cirrhosis comp gated by varices: Unfortunately patient cannot tolerate any p.o. at this time due to his altered mental status. - Hold nadolol 20 mg daily - Hold lactulose   #)  COPD: -Continue to ectropium -Continue PRN bronchodilators  #) Pain/psych: - Hold baclofen 10 mg 3 times daily -Hold diclofenac 50 mg twice daily -Hold nortriptyline 150 mg nightly  Fluids: Gentle IV fluids Elect lites: Monitor and supplement Nutrition: N.p.o. due to altered mental status  Prophylaxis: Enoxaparin  Disposition: Pending discussion with palliative care and goals of care conversation  DO NOT RESUSCITATE  Consultants:   Palliative care  Procedures:   None  Antimicrobials:   IV Unasyn started 01/29/2018   Subjective: Patient is diffusely agitated in bed and does not respond to any questions.  He appears to be not uncomfortable but quite agitated.  Objective: Vitals:   01/29/18 0500 01/29/18 0600 01/29/18 0712 01/29/18 0813  BP: 136/86 118/76    Pulse: 84 81    Resp: 18 17    Temp:    (!) 97.3 F (36.3 C)  TempSrc:    Axillary  SpO2: 94% 93% 98%   Weight:      Height:        Intake/Output Summary (Last 24 hours) at 01/29/2018 0910 Last data filed at 01/29/2018 0845 Gross per 24 hour  Intake 1762.55 ml  Output 0 ml  Net 1762.55 ml   Filed Weights   02/22/2018 0722 01/30/2018 0758  Weight: 98.7 kg 98.4 kg    Examination:  General exam: Agitated, fighting in bed Respiratory system: Mildly increased work of breathing, scattered rhonchi throughout anterior lung fields, no wheezes or crackles Cardiovascular system: Tachycardic, regular rate and rhythm, no murmurs Gastrointestinal system: Soft, nondistended, no rebound or guarding, plus bowel sounds Central nervous system: Rotated, not following commands, not alert or oriented, encephalopathic, moving all extremities but no focal weakness Extremities: Trace lower extremity edema Skin: Numerous  contusions over skin Psychiatry: To assess due to medical condition    Data Reviewed: I have personally reviewed following labs and imaging studies  CBC: Recent Labs  Lab 01/27/18 1123 02/10/2018 0750  01/29/18 0242  WBC 12.7* 13.8* 11.5*  NEUTROABS 10.1* 11.5*  --   HGB 14.9 14.7 14.1  HCT 44.4 45.2 44.2  MCV 97.8 99.6 101.1*  PLT 110* 126* 845*   Basic Metabolic Panel: Recent Labs  Lab 01/27/18 1123 02/25/2018 0750 01/29/18 0242  NA 137 137 139  K 3.9 3.7 3.9  CL 100 100 103  CO2 27 25 28   GLUCOSE 128* 122* 115*  BUN 13 15 18   CREATININE 0.87 0.83 0.69  CALCIUM 11.1* 10.9* 10.5*   GFR: Estimated Creatinine Clearance: 122.1 mL/min (by C-G formula based on SCr of 0.69 mg/dL). Liver Function Tests: Recent Labs  Lab 01/27/18 1123 02/01/2018 0750 01/29/18 0242  AST 52* 80* 81*  ALT 17 26 28   ALKPHOS 180* 142* 133*  BILITOT 4.2* 4.5* 3.1*  PROT 7.2 7.2 6.9  ALBUMIN 3.0* 3.3* 3.2*   Recent Labs  Lab 02/08/2018 0750  LIPASE 44   Recent Labs  Lab 02/11/2018 0750  AMMONIA 34   Coagulation Profile: Recent Labs  Lab 01/29/2018 0750 01/29/18 0242  INR 1.46 1.70   Cardiac Enzymes: Recent Labs  Lab 02/24/2018 0748 02/09/2018 0750 01/29/18 0242  CKTOTAL 1,517*  --  986*  TROPONINI  --  0.03*  --    BNP (last 3 results) No results for input(s): PROBNP in the last 8760 hours. HbA1C: No results for input(s): HGBA1C in the last 72 hours. CBG: No results for input(s): GLUCAP in the last 168 hours. Lipid Profile: No results for input(s): CHOL, HDL, LDLCALC, TRIG, CHOLHDL, LDLDIRECT in the last 72 hours. Thyroid Function Tests: No results for input(s): TSH, T4TOTAL, FREET4, T3FREE, THYROIDAB in the last 72 hours. Anemia Panel: No results for input(s): VITAMINB12, FOLATE, FERRITIN, TIBC, IRON, RETICCTPCT in the last 72 hours. Sepsis Labs: Recent Labs  Lab 02/04/2018 0752 02/06/2018 0950  LATICACIDVEN 2.26* 2.03*    Recent Results (from the past 240 hour(s))  Blood Culture (routine x 2)     Status: None (Preliminary result)   Collection Time: 02/26/2018  7:50 AM  Result Value Ref Range Status   Specimen Description   Final    BLOOD BLOOD RIGHT FOREARM Performed at  [facility] [address] [place], [place] [address]    Special Requests   Final    BOTTLES DRAWN AEROBIC AND ANAEROBIC Blood Culture adequate volume Performed at [facility] [address], [place], [place] [address]    Culture   Final    NO GROWTH < 24 HOURS Performed at [facility] [address] [place], [place] [address]    Report Status PENDING  Incomplete  Blood Culture (routine x 2)     Status: None (Preliminary result)   Collection Time: 02/08/2018  7:50 AM  Result Value Ref Range Status   Specimen Description   Final    BLOOD BLOOD LEFT FOREARM Performed at [facility] [address] [place], [place] [address]    Special Requests   Final    BOTTLES DRAWN AEROBIC AND ANAEROBIC Blood Culture adequate volume Performed at [facility] [address] [place],  [address]    Culture   Final    NO GROWTH < 24 HOURS Performed at [facility], [address] [place], [place] [address]    Report Status PENDING  Incomplete  MRSA PCR Screening     Status: None   Collection Time: 02/12/2018  2:19 PM  Result Value Ref Range Status   MRSA by PCR NEGATIVE NEGATIVE Final    Comment:        The GeneXpert MRSA Assay (FDA approved for NASAL specimens only), is one component of a comprehensive MRSA colonization surveillance program. It is not intended to diagnose MRSA infection nor to guide or monitor treatment for MRSA infections. Performed at [facility], [place] [address] [place], [place] [address]          Radiology Studies: Ct Head Wo Contrast  Result Date: 01/29/2018 CLINICAL DATA:  Weakness. EXAM: CT HEAD WITHOUT CONTRAST TECHNIQUE: Contiguous axial images were obtained from the base of the skull through the vertex without intravenous contrast. COMPARISON:  MRI 01/21/2018 FINDINGS: Brain: No acute intracranial abnormality. Specifically, no  hemorrhage, hydrocephalus, mass lesion, acute infarction, or significant intracranial injury. Vascular: No hyperdense vessel or unexpected calcification. Skull: No acute calvarial abnormality. Sinuses/Orbits: Mucosal thickening in the paranasal sinuses. No air-fluid levels. Other: None IMPRESSION: No acute intracranial abnormality. Chronic sinusitis. Electronically Signed   By: Rolm Baptise M.D.   On: 02/03/2018 09:26   Dg Chest Port 1 View  Result Date: 02/12/2018 CLINICAL DATA:  Lung carcinoma with weakness EXAM: PORTABLE CHEST 1 VIEW COMPARISON:  PET-CT January 21, 2018; chest radiograph January 01, 2018 FINDINGS: There is a small left pleural effusion. There is interstitial prominence in the lung bases which may represent edema or possibly lymphangitic spread of tumor. Multiple nodular opacities seen on recent PET study are less well seen by radiography. There is no frank airspace consolidation. Heart is mildly enlarged with pulmonary vascularity normal. Adenopathy seen on CT is present but less well seen by radiography. There is aortic atherosclerosis. There is postoperative change in the lower cervical region. IMPRESSION: Cardiomegaly. Small left pleural effusion. Pilar Plate consolidation. Interstitial prominence in the bases may represent edema or potential lymphangitic spread of tumor. Multiple nodular lesions seen on recent PET study are not well seen by radiography. There is aortic atherosclerosis. Adenopathy is better appreciated on recent CT. Aortic Atherosclerosis (ICD10-I70.0). Electronically Signed   By: Lowella Grip III M.D.   On: 02/24/2018 08:03   US Abdomen Limited Ruq  Result Date: 01/29/2018 CLINICAL DATA:  Elevated LFTs EXAM: ULTRASOUND ABDOMEN LIMITED RIGHT UPPER QUADRANT COMPARISON:  CT 01/21/2018 FINDINGS: Gallbladder: Multiple gallstones within lumen gallbladder. These are well depicted on CT 01/21/2018. Mild gallbladder wall thickening to 4 mm. No gallbladder distention or  pericholecystic fluid. Negative sonographic Murphy's sign. Common bile duct: Diameter: Normal at 3 mm Liver: Liver has a nodular contour. No ascites. Portal vein is patent on color Doppler imaging with normal direction of blood flow towards the liver. IMPRESSION: 1. Cholelithiasis without evidence of acute cholecystitis. Gallbladder wall thickening could indicate chronic cholecystitis. 2. Nodule liver suggests cirrhosis. No focal lesion or biliary duct dilatation. No ascites. Electronically Signed   By: Suzy Bouchard M.D.   On: 01/29/2018 08:00        Scheduled Meds: . folic acid  1 mg Intravenous Daily  . mouth rinse  15 mL Mouth Rinse BID  . thiamine injection  100 mg Intravenous Daily  . umeclidinium bromide  1 puff Inhalation Daily   Continuous Infusions: . sodium chloride 75 mL/hr at 01/29/18 0845  . dexmedetomidine (PRECEDEX) IV infusion 0.6 mcg/kg/hr (01/29/18 0845)  LOS: 1 day    Time spent: [place], MD [facility]  If 7PM-7AM, please contact night-coverage [email or web address] Password [email or web address] 01/29/2018, 9:10 AM

## 2018-01-29 NOTE — Consult Note (Signed)
Consultation Note Date: 01/29/2018   Patient Name: Michael Mcfarland  DOB: [birth year]  MRN: [id number]  Age / Sex: 52 y.o., male  PCP: [name], MD Referring Physician: Cristy Folks, MD  Reason for Consultation: Establishing goals of care  HPI/Patient Profile: 51 y.o. male    admitted on 02/02/2018   52 year old with past medical history relevant for alcohol abuse, gated by prior history of withdrawals, metastatic non-small cell lung cancer currently not eligible for treatment, vocal cord paralysis, COPD, alcoholic cirrhosis complicated by portal hypertensive gastropathy and grade 2 varices on endoscopy on 06/18/2017 as well as hepatic encephalopathy who presented on 02/20/2018 after being found down and altered.  Clinical Assessment and Goals of Care:  The patient remains admitted under hospital medicine, to the step down unit at [facility]. The patient has been admitted for ETOH use, metabolic encephalopathy, also has pulmonary infiltrates, possible PNA. He also has a life limiting illness of metastatic non small cell lung cancer.Patient has extensive lymphadenopathy in the chest and is causing vocal cord paralysis due to his lung cancer. He also has ETOH cirrhosis, complicated by varices.   He remains NPO, he is on Precedex drip. He is on IV Abx.   A palliative consult has been placed for goals of care discussions. Discussed about focusing on symptom management, continuing current therapies, transitioning to full scope of comfort measures, should the patient have ongoing decline, briefly also discussed with her about the type of care that is provided in a residential hospice setting.   The patient is agitated, rest less, trying to slide out of bed, he is reaching up and trying to grab something. He opens his eyes when his name is called, but he doesn't verbalize at all. He is trying to slide out  of his bed. There is no family at bedside.   Call placed and discussed with his sister HCPOA agent Mariann Laster. We reviewed the patient's current condition, his goals, wishes and values attempted to be explored.   NEXT OF KIN  sister Michael Mcfarland [phone] is the patient's designated HCPOA, she states that the patient doesn't have a spouse, he has a 84 year old son he is estranged from, he also has a step son in his 31s who is aware of the patient's current condition, according to [name].     SUMMARY OF RECOMMENDATIONS    Agree with DNR.  Continue current mode of care.  Agree with Precedex drip.  Will add scheduled IV Morphine, also has PRN IV Morphine.   Monitor hospital course and disease trajectory.  Time limited trial of current interventions for at least next 24 hours.  Discussed with HCPOA sister [name] about patient requiring residential hospice towards the end of this hospitalization.   Thank you for the consultation.   Code Status/Advance Care Planning:  DNR    Symptom Management:    as above   Palliative Prophylaxis:   Delirium Protocol    Psycho-social/Spiritual:   Desire for further  Chaplaincy support:yes  Additional Recommendations: Education on Hospice  Prognosis:   Guarded   Discharge Planning: To Be Determined      Primary Diagnoses: Present on Admission: . Alcohol withdrawal ([place])   I have reviewed the medical record, interviewed the patient and family, and examined the patient. The following aspects are pertinent.  Past Medical History:  Diagnosis Date  . Alcohol abuse    /notes 01/18/2017  . Alcohol related seizure ([place]) 01/17/2017   Archie Endo 01/17/2017  . Anxiety   . Arthritis   . Cirrhosis, alcoholic ([place]) 57/3220  . COPD (chronic obstructive pulmonary disease) ([place])   . Depression   . Emphysema lung ([place])   . Frequent headaches   . GERD (gastroesophageal reflux disease)   . Hay fever   . Hepatitis B   . IBS (irritable  bowel syndrome)   . Migraine    frequency "depends on the weather" (01/19/2017)  . Panic attacks   . Pneumonia   . Portal hypertensive gastropathy ([place])   . Seizure ([place]) 05/20/2017 X 2  . Stroke [facility])    Social History   Socioeconomic History  . Marital status: Widowed    Spouse name: Not on file  . Number of children: 1  . Years of education: Not on file  . Highest education level: Not on file  Occupational History  . Occupation: Dealer    Comment: self employed but stopped working ~ 06-20-09 following death of wife and after having neck surgery.    Social Needs  . Financial resource strain: Not on file  . Food insecurity:    Worry: Not on file    Inability: Not on file  . Transportation needs:    Medical: Yes    Non-medical: Yes  Tobacco Use  . Smoking status: Current Some Day Smoker    Packs/day: 0.25    Years: 35.00    Pack years: 8.75    Types: Cigarettes  . Smokeless tobacco: Never Used  . Tobacco comment: quit smoking after admission 04/2017  Substance and Sexual Activity  . Alcohol use: Not Currently    Alcohol/week: 18.0 standard drinks    Types: 18 Cans of beer per week    Comment: until admission 04/2017 drinking 12 to 18 beers daily.  since then, no beer or other ETOH (as of 06/17/17)  . Drug use: Not Currently    Comment: 05/20/2017 "been a long time; I tried different things when I was younger"  . Sexual activity: Not Currently    Comment: widowed in 06/20/2009  Lifestyle  . Physical activity:    Days per week: Not on file    Minutes per session: Not on file  . Stress: Not on file  Relationships  . Social connections:    Talks on phone: Not on file    Gets together: Not on file    Attends religious service: Not on file    Active member of club or organization: Not on file    Attends meetings of clubs or organizations: Not on file    Relationship status: Not on file  Other Topics Concern  . Not on file  Social History Narrative  . Not on file   Family  History  Problem Relation Age of Onset  . Diabetes Mother   . Depression Mother   . Hypertension Mother   . Arthritis Mother   . Arthritis Father   . Heart disease Father   . Liver disease Sister  fatty liver  . Heart disease Brother   . Heart attack Paternal Grandfather   . Diabetes Sister    Scheduled Meds: . folic acid  1 mg Intravenous Daily  . mouth rinse  15 mL Mouth Rinse BID  . mouth rinse  15 mL Mouth Rinse BID  .  morphine injection  2 mg Intravenous Q6H  . thiamine injection  100 mg Intravenous Daily  . umeclidinium bromide  1 puff Inhalation Daily   Continuous Infusions: . sodium chloride 75 mL/hr at 01/29/18 1013  . ampicillin-sulbactam (UNASYN) IV    . dexmedetomidine (PRECEDEX) IV infusion     PRN Meds:.albuterol, bisacodyl, LORazepam, morphine injection, ondansetron **OR** ondansetron (ZOFRAN) IV Medications Prior to Admission:  Prior to Admission medications   Medication Sig Start Date End Date Taking? Authorizing Provider  albuterol (PROVENTIL HFA;VENTOLIN HFA) 108 (90 Base) MCG/ACT inhaler TAKE 2 PUFFS BY MOUTH EVERY 6 HOURS AS NEEDED FOR WHEEZE OR SHORTNESS OF BREATH Patient taking differently: Inhale 2 puffs into the lungs every 6 (six) hours as needed for wheezing or shortness of breath.  01/20/18  Yes [name], MD  baclofen (LIORESAL) 10 MG tablet Take 1 tablet (10 mg total) by mouth 3 (three) times daily. 01/11/18  Yes [name], MD  diclofenac (VOLTAREN) 50 MG EC tablet Take 50 mg by mouth 2 (two) times daily.   Yes [provider]  doxycycline (VIBRA-TABS) 100 MG tablet Take 1 tablet (100 mg total) by mouth 2 (two) times daily. 01/11/18  Yes [name], MD  folic acid (FOLVITE) 1 MG tablet Take 1 tablet (1 mg total) by mouth daily. 07/24/17  Yes Emokpae, Courage, MD  nadolol (CORGARD) 20 MG tablet Take 1 tablet (20 mg total) by mouth daily. 11/12/17  Yes [name], MD  nortriptyline (PAMELOR) 75 MG capsule Take 2  capsules (150 mg total) by mouth at bedtime. 12/28/17  Yes [name], MD  Tiotropium Bromide Monohydrate (SPIRIVA RESPIMAT) 2.5 MCG/ACT AERS Inhale 2 puffs into the lungs daily. 12/28/17  Yes [name], MD  feeding supplement, ENSURE ENLIVE, (ENSURE ENLIVE) LIQD Take 237 mLs by mouth 2 (two) times daily between meals. Patient not taking: Reported on 02/26/2018 06/18/17   Kayleen Memos, DO  ipratropium (ATROVENT) 0.06 % nasal spray Place 2 sprays into both nostrils 4 (four) times daily. Patient not taking: Reported on 02/21/2018 11/12/17   [name], MD  lactulose [facility]) 10 GM/15ML solution Take 35mL one to three times daily to titrate for 2-3 soft stools per day Patient not taking: Reported on 01/29/2018 11/12/17   [name], MD  Multiple Vitamin (MULTIVITAMIN WITH MINERALS) TABS tablet Take 1 tablet by mouth daily. Patient not taking: Reported on 02/25/2018 07/25/17   Roxan Hockey, MD  nystatin (MYCOSTATIN/NYSTOP) powder Apply topically 4 (four) times daily. Patient not taking: Reported on 02/07/2018 11/12/17   [name], MD  pantoprazole (PROTONIX) 40 MG tablet Take 1 tablet (40 mg total) by mouth daily at 12 noon. Patient not taking: Reported on 02/01/2018 07/08/17   [name], MD  thiamine 100 MG tablet Take 1 tablet (100 mg total) by mouth daily. Patient not taking: Reported on 02/24/2018 01/04/18   Mariel Aloe, MD   No Known Allergies Review of Systems Non verbal   Physical Exam Patient is restless, agitated, fighting in bed Respiratory system: Mildly increased work of breathing, scattered rhonchi throughout anterior lung fields, no wheezes or crackles Cardiovascular  system: Tachycardic, regular rate and rhythm, no murmurs Gastrointestinal system: Soft, nondistended, no rebound or guarding, plus bowel sounds Central nervous system:  not following commands, not alert or oriented, encephalopathic, moving all extremities but no focal  weakness Extremities: Trace lower extremity edema Skin: Numerous bruises over skin    Vital Signs: BP (!) 188/113   Pulse 86   Temp (!) 97.3 F (36.3 C) (Axillary)   Resp (!) 23   Ht 5\' 9"  (1.753 m)   Wt 98.4 kg   SpO2 93%   BMI 32.05 kg/m  Pain Scale: CPOT   Pain Score: 0-No pain   SpO2: SpO2: 93 % O2 Device:SpO2: 93 % O2 Flow Rate: .O2 Flow Rate (L/min): 5 L/min  IO: Intake/output summary:   Intake/Output Summary (Last 24 hours) at 01/29/2018 1124 Last data filed at 01/29/2018 1013 Gross per 24 hour  Intake 1704.44 ml  Output 0 ml  Net 1704.44 ml    LBM: Last BM Date: (UTA) Baseline Weight: Weight: 98.7 kg Most recent weight: Weight: 98.4 kg     Palliative Assessment/Data:   PPS 20%  Time In:  10 Time Out:  11 Time Total:  60 min  Greater than 50%  of this time was spent counseling and coordinating care related to the above assessment and plan.  Signed by: Loistine Chance, MD  [phone] Please contact Palliative Medicine Team phone at [phone] for questions and concerns.  For individual provider: See Shea Evans

## 2018-01-29 NOTE — Progress Notes (Signed)
CSW acknowledged consult for current substance use. Per chart review, patient oriented x person and time. Patient currently not able to participate in assessment and conversation regarding current substance abuse. CSW to follow up with patient when patient is able to engage in conversation regarding current substance abuse.   Michael Mcfarland, [place] Social Worker Weekend Cell#: [phone]

## 2018-01-30 DIAGNOSIS — J9601 Acute respiratory failure with hypoxia: Secondary | ICD-10-CM

## 2018-01-30 DIAGNOSIS — F10231 Alcohol dependence with withdrawal delirium: Principal | ICD-10-CM

## 2018-01-30 DIAGNOSIS — J9602 Acute respiratory failure with hypercapnia: Secondary | ICD-10-CM

## 2018-01-30 LAB — COMPREHENSIVE METABOLIC PANEL WITH GFR
Alkaline Phosphatase: 114 U/L (ref 38–126)
Anion gap: 6 (ref 5–15)
Calcium: 10.6 mg/dL — ABNORMAL HIGH (ref 8.9–10.3)
GFR calc Af Amer: >60 mL/min (ref >60)
Potassium: 4.7 mmol/L (ref 3.5–5.1)
Sodium: 147 mmol/L — ABNORMAL HIGH (ref 135–145)
Total Bilirubin: 1.9 mg/dL — ABNORMAL HIGH (ref 0.3–1.2)
Total Protein: 6.2 g/dL — ABNORMAL LOW (ref 6.5–8.1)

## 2018-01-30 LAB — PROCALCITONIN: Procalcitonin: 0.17 ng/mL

## 2018-01-30 LAB — COMPREHENSIVE METABOLIC PANEL
ALT: 30 U/L (ref 0–44)
AST: 60 U/L — ABNORMAL HIGH (ref 15–41)
Albumin: 2.7 g/dL — ABNORMAL LOW (ref 3.5–5.0)
BUN: 24 mg/dL — ABNORMAL HIGH (ref 6–20)
CO2: 30 mmol/L (ref 22–32)
Chloride: 111 mmol/L (ref 98–111)
Creatinine, Ser: 0.67 mg/dL (ref 0.61–1.24)
GFR calc non Af Amer: >60 mL/min (ref >60)
Glucose, Bld: 102 mg/dL — ABNORMAL HIGH (ref 70–99)

## 2018-01-30 LAB — CBC
HCT: 45.8 % (ref 39.0–52.0)
Hemoglobin: 14.6 g/dL (ref 13.0–17.0)
MCH: 32.4 pg (ref 26.0–34.0)
MCHC: 31.9 g/dL (ref 30.0–36.0)
MCV: 101.8 fL — ABNORMAL HIGH (ref 80.0–100.0)
Platelets: 121 K/uL — ABNORMAL LOW (ref 150–400)
RBC: 4.5 MIL/uL (ref 4.22–5.81)
RDW: 13.9 % (ref 11.5–15.5)
WBC: 9.4 10*3/uL (ref 4.0–10.5)
nRBC: 0 % (ref 0.0–0.2)

## 2018-01-30 LAB — MAGNESIUM: Magnesium: 1.8 mg/dL (ref 1.7–2.4)

## 2018-01-30 MED ORDER — HYDRALAZINE HCL 20 MG/ML IJ SOLN
INTRAMUSCULAR | Status: AC
  Filled 2018-01-30: qty 1

## 2018-01-30 MED ORDER — HYDRALAZINE HCL 20 MG/ML IJ SOLN
5.0000 mg | Freq: Once | INTRAMUSCULAR | Status: AC
  Administered 2018-01-30: 5 mg via INTRAVENOUS

## 2018-01-30 MED ORDER — MORPHINE 100MG IN NS 100ML (1MG/ML) PREMIX INFUSION
2.0000 mg/h | INTRAVENOUS | Status: DC
  Filled 2018-01-30: qty 100

## 2018-01-30 MED ORDER — MORPHINE 100MG IN NS 100ML (1MG/ML) PREMIX INFUSION
2.0000 mg/h | INTRAVENOUS | Status: DC
  Administered 2018-01-30: 2 mg/h via INTRAVENOUS
  Administered 2018-01-31: 6 mg/h via INTRAVENOUS
  Filled 2018-01-30 (×2): qty 100

## 2018-01-30 NOTE — Progress Notes (Signed)
Daily Progress Note   Patient Name: Michael Mcfarland       Date: 01/30/2018 DOB: May 16, 1965  Age: 52 y.o. MRN#: [id number] Attending Physician: Isaac Bliss, [place] Primary Care Physician: [name], MD Admit Date: 02/25/2018  Reason for Consultation/Follow-up: Establishing goals of care  Subjective:  not as restless Does not open eyes Does not follow commands  Son Josh at bedside See below    Length of Stay: 2  Current Medications: Scheduled Meds:  . folic acid  1 mg Intravenous Daily  . mouth rinse  15 mL Mouth Rinse BID  .  morphine injection  2 mg Intravenous Q6H  . thiamine injection  100 mg Intravenous Daily  . umeclidinium bromide  1 puff Inhalation Daily    Continuous Infusions: . sodium chloride 75 mL/hr at 01/30/18 0554  . ampicillin-sulbactam (UNASYN) IV Stopped (01/30/18 0602)  . dexmedetomidine (PRECEDEX) IV infusion 1.2 mcg/kg/hr (01/30/18 1002)    PRN Meds: albuterol, bisacodyl, LORazepam, morphine injection, ondansetron **OR** ondansetron (ZOFRAN) IV  Physical Exam           Not alert, not awake, not oriented, significant agitation and restlessness.  Normal respiratory effort, coarse bilateral breath sounds,   Cardiovascular system:RRR.  Marland Kitchen Gastrointestinal system: Abdomen is nondistended, soft and nontender. No organomegaly or masses felt. Normal bowel sounds heard. Central nervous system: Unable to fully assess given current mental state, does move all 4 spontaneously Extremities: No C/C/E, +pedal pulses Skin: No rashes, lesions or ulcers Psychiatry: Unable to assess given current mental state.   Vital Signs: BP (!) 167/88   Pulse 80   Temp 98.8 F (37.1 C) (Axillary)   Resp 20   Ht 5\' 9"  (1.753 m)   Wt 98.4 kg   SpO2 96%   BMI 32.05  kg/m  SpO2: SpO2: 96 % O2 Device: O2 Device: Nasal Cannula O2 Flow Rate: O2 Flow Rate (L/min): 8 L/min  Intake/output summary:   Intake/Output Summary (Last 24 hours) at 01/30/2018 1055 Last data filed at 01/30/2018 0100 Gross per 24 hour  Intake 916.68 ml  Output 200 ml  Net 716.68 ml   LBM: Last BM Date: (UTA) Baseline Weight: Weight: 98.7 kg Most recent weight: Weight: 98.4 kg       Palliative Assessment/Data: PPS 10%     Patient Active Problem  List   Diagnosis Date Noted  . Malignant neoplasm of lung ([place])   . Palliative care by specialist   . Alcohol withdrawal ([place]) 02/11/2018  . Encounter for antineoplastic chemotherapy 01/13/2018  . Goals of care, counseling/discussion 01/13/2018  . Stage IV squamous cell carcinoma of right lung ([place]) 01/13/2018  . Non-small cell carcinoma of lung ([place]) 01/11/2018  . Secondary esophageal varices without bleeding ([place]) 01/02/2018  . Delirium 01/01/2018  . COPD with acute exacerbation ([place]) 01/01/2018  . Pulmonary nodules 12/28/2017  . Pulmonary emphysema ([place]) 12/28/2017  . Aortic atherosclerosis ([place]) 12/28/2017  . Candidal intertrigo 11/12/2017  . Nicotine dependence with current use 09/17/2017  . Contraindication to anticoagulation therapy 09/17/2017  . Eustachian tube dysfunction 08/30/2017  . Protein-calorie malnutrition ([place]) 07/29/2017  . Alcohol withdrawal syndrome with perceptual disturbance ([place]) 07/18/2017  . Benzodiazepine abuse, episodic ([place]) 07/18/2017  . SDH (subdural hematoma) ([place]) 07/16/2017  . Insomnia 07/08/2017  . Low back pain 07/08/2017  . Esophageal varices in alcoholic cirrhosis ([place])   . Hepatic encephalopathy ([place]) 06/15/2017  . Anxiety 06/03/2017  . Seizure ([place])   . Cirrhosis ([place])   . Thrombocytopenia ([place])   . Alcohol withdrawal seizure ([place]) 01/17/2017    Palliative Care Assessment & Plan   Patient Profile:    Assessment:  ETOH withdrawal Asp pna Metastatic non small cell lung  cancer ETOH cirrhosis history of varices   Recommendations/Plan:   continue time limited trial of current interventions for another 24 hours, consider comfort care and transfer to residential hospice if no significant or meaningful change in overall condition by 27-Feb-2018, discussed with son at bedside.    Code Status:    Code Status Orders  (From admission, onward)         Start     Ordered   01/29/2018 1218  Do not attempt resuscitation (DNR)  Continuous    Question Answer Comment  In the event of cardiac or respiratory ARREST Do not call a "code blue"   In the event of cardiac or respiratory ARREST Do not perform Intubation, CPR, defibrillation or ACLS   In the event of cardiac or respiratory ARREST Use medication by any route, position, wound care, and other measures to relive pain and suffering. May use oxygen, suction and manual treatment of airway obstruction as needed for comfort.   Comments Ms. Mariann Laster is medical POA.      02/10/2018 1218        Code Status History    Date Active Date Inactive Code Status Order ID Comments User Context   02/14/2018 0801 02/18/2018 1218 DNR [id number]  Varney Biles, MD ED   01/01/2018 1851 01/03/2018 2027 Partial Code [id number]  Colbert Ewing, MD ED   07/18/2017 0952 07/24/2017 1834 Partial Code [id number]  Renee Pain, MD Inpatient   07/16/2017 2333 07/18/2017 0952 Full Code [id number]  Etta Quill, DO ED   06/15/2017 2107 06/22/2017 1555 Full Code [id number]  Ivor Costa, MD ED   05/20/2017 2037 05/23/2017 1610 Full Code [id number]  Jani Gravel, MD Inpatient   01/17/2017 2151 01/20/2017 1902 Full Code [id number]  Etta Quill, DO ED    Advance Directive Documentation     Most Recent Value  Type of Advance Directive  Healthcare Power of Attorney, Living will  Pre-existing out of facility DNR order (yellow form or pink MOST form)  -  "MOST" Form in Place?  -       Prognosis:   < 2 weeks  Discharge  Planning:  To Be  Determined  Care plan was discussed with  Son at bedside.  Also discussed with DR Erasmo Score MD   Thank you for allowing the Palliative Medicine Team to assist in the care of this patient.   Time In:  9 Time Out: 9.25 Total Time 25 Prolonged Time Billed  no       Greater than 50%  of this time was spent counseling and coordinating care related to the above assessment and plan.  Loistine Chance, MD [phone]  Please contact Palliative Medicine Team phone at [phone] for questions and concerns.

## 2018-01-30 NOTE — Progress Notes (Signed)
PROGRESS NOTE    Michael Mcfarland  XTK:[id number] DOB: [birth year] DOA: 02/08/2018 PCP: [name], MD     Brief Narrative:  52 year old man admitted from home on 11/1 this patient was found down and altered at home.  Attempts were made to lift him which were unsuccessful and he was left on the ground overnight.  The next morning he was still found to be very confused and weak EMS was called and he was transferred to the hospital for evaluation.  His past medical history significant for COPD, alcoholic cirrhosis, multiple episodes of withdrawals in the past, esophageal varices, metastatic non-small cell lung cancer, alcohol abuse and tobacco abuse.  He also has a history of vocal cord paralysis.   Assessment & Plan:   Active Problems:   Alcohol withdrawal ([place])   Malignant neoplasm of lung ([place])   Palliative care by specialist   Alcohol withdrawals -Remains in the stepdown unit on a Precedex drip. -The time of my evaluation still appears restless and agitated. -Discussed case with palliative care consultants and stepson at bedside, plan to transition to full comfort care possibly as early as tomorrow if no significant changes in his clinical condition. -Palliative care is managing comfort medications.  Aspiration pneumonia -Agree with continued Unasyn for now. -Patient remains afebrile.  Metastatic non-small cell lung cancer -Patient has extensive lymphadenopathy in the chest and has vocal cord paralysis due to this. -I have reviewed last oncology's note and patient was considering palliation.  As of this hospital admission they have decided to pursue full palliative care status.  Alcoholic cirrhosis complicated by esophageal varices -Patient is unable to tolerate oral medications, nadolol, lactulose remain on hold.  COPD -Remains stable at this time, not compensated.   DVT prophylaxis: SCDs Code Status: DNR Family Communication: Stepson at bedside updated on plan of care  and all questions answered Disposition Plan: Likely transition to full comfort care over next 24 to 48 hours if no significant change in clinical condition.  At that point would need to transition to residential hospice placement.  Consultants:   Palliative care  Procedures:   None  Antimicrobials:  Anti-infectives (From admission, onward)   Start     Dose/Rate Route Frequency Ordered Stop   01/29/18 1200  ampicillin-sulbactam (UNASYN) 1.5 g in sodium chloride 0.9 % 100 mL IVPB     1.5 g 200 mL/hr over 30 Minutes Intravenous Every 6 hours 01/29/18 0949     02/01/2018 0730  ceFEPIme (MAXIPIME) 2 g in sodium chloride 0.9 % 100 mL IVPB     2 g 200 mL/hr over 30 Minutes Intravenous  Once 02/01/2018 0724 02/24/2018 0833   02/10/2018 0730  metroNIDAZOLE (FLAGYL) IVPB 500 mg  Status:  Discontinued     500 mg 100 mL/hr over 60 Minutes Intravenous Every 8 hours 02/03/2018 0724 02/07/2018 0845   02/25/2018 0730  vancomycin (VANCOCIN) IVPB 1000 mg/200 mL premix     1,000 mg 200 mL/hr over 60 Minutes Intravenous  Once 02/20/2018 0724 02/01/2018 1047       Subjective: Lying in bed, nonverbal, anxious, restless.  Objective: Vitals:   01/30/18 0300 01/30/18 0400 01/30/18 0700 01/30/18 0819  BP: (!) 151/102 (!) 155/101 (!) 167/88   Pulse: 72 60 80   Resp: 16 17 20    Temp:    98.8 F (37.1 C)  TempSrc:    Axillary  SpO2: 97% 93% 96%   Weight:      Height:  Intake/Output Summary (Last 24 hours) at 01/30/2018 0922 Last data filed at 01/30/2018 0100 Gross per 24 hour  Intake 1052.67 ml  Output 200 ml  Net 852.67 ml   Filed Weights   02/16/2018 0722 02/12/2018 0758  Weight: 98.7 kg 98.4 kg    Examination:  General exam: Not alert, not awake, not oriented, significant agitation and restlessness. Respiratory system: Normal respiratory effort, coarse bilateral breath sounds, no wheezes. Cardiovascular system:RRR. No murmurs, rubs, gallops. Gastrointestinal system: Abdomen is nondistended, soft  and nontender. No organomegaly or masses felt. Normal bowel sounds heard. Central nervous system: Unable to fully assess given current mental state, does move all 4 spontaneously Extremities: No C/C/E, +pedal pulses Skin: No rashes, lesions or ulcers Psychiatry: Unable to assess given current mental state.     Data Reviewed: I have personally reviewed following labs and imaging studies  CBC: Recent Labs  Lab 01/27/18 1123 02/23/2018 0750 01/29/18 0242 01/30/18 0357  WBC 12.7* 13.8* 11.5* 9.4  NEUTROABS 10.1* 11.5*  --   --   HGB 14.9 14.7 14.1 14.6  HCT 44.4 45.2 44.2 45.8  MCV 97.8 99.6 101.1* 101.8*  PLT 110* 126* 107* 570*   Basic Metabolic Panel: Recent Labs  Lab 01/27/18 1123 02/12/2018 0750 01/29/18 0242 01/30/18 0357  NA 137 137 139 147*  K 3.9 3.7 3.9 4.7  CL 100 100 103 111  CO2 27 25 28 30   GLUCOSE 128* 122* 115* 102*  BUN 13 15 18  24*  CREATININE 0.87 0.83 0.69 0.67  CALCIUM 11.1* 10.9* 10.5* 10.6*  MG  --   --   --  1.8   GFR: Estimated Creatinine Clearance: 122.1 mL/min (by C-G formula based on SCr of 0.67 mg/dL). Liver Function Tests: Recent Labs  Lab 01/27/18 1123 02/21/2018 0750 01/29/18 0242 01/30/18 0357  AST 52* 80* 81* 60*  ALT 17 26 28 30   ALKPHOS 180* 142* 133* 114  BILITOT 4.2* 4.5* 3.1* 1.9*  PROT 7.2 7.2 6.9 6.2*  ALBUMIN 3.0* 3.3* 3.2* 2.7*   Recent Labs  Lab 01/29/2018 0750  LIPASE 44   Recent Labs  Lab 02/16/2018 0750  AMMONIA 34   Coagulation Profile: Recent Labs  Lab 02/06/2018 0750 01/29/18 0242  INR 1.46 1.70   Cardiac Enzymes: Recent Labs  Lab 02/17/2018 0748 02/22/2018 0750 01/29/18 0242  CKTOTAL 1,517*  --  986*  TROPONINI  --  0.03*  --    BNP (last 3 results) No results for input(s): PROBNP in the last 8760 hours. HbA1C: No results for input(s): HGBA1C in the last 72 hours. CBG: Recent Labs  Lab 01/29/18 1206  GLUCAP 109*   Lipid Profile: No results for input(s): CHOL, HDL, LDLCALC, TRIG, CHOLHDL,  LDLDIRECT in the last 72 hours. Thyroid Function Tests: No results for input(s): TSH, T4TOTAL, FREET4, T3FREE, THYROIDAB in the last 72 hours. Anemia Panel: No results for input(s): VITAMINB12, FOLATE, FERRITIN, TIBC, IRON, RETICCTPCT in the last 72 hours. Urine analysis:    Component Value Date/Time   COLORURINE AMBER (A) 01/29/2018 0723   APPEARANCEUR HAZY (A) 02/26/2018 0723   LABSPEC 1.031 (H) 02/12/2018 0723   PHURINE 5.0 01/30/2018 0723   GLUCOSEU NEGATIVE 01/30/2018 0723   HGBUR NEGATIVE 02/21/2018 0723   BILIRUBINUR NEGATIVE 02/08/2018 0723   KETONESUR 5 (A) 02/13/2018 0723   PROTEINUR 100 (A) 02/01/2018 0723   NITRITE NEGATIVE 02/24/2018 0723   LEUKOCYTESUR NEGATIVE 02/23/2018 0723   Sepsis Labs: @LABRCNTIP (procalcitonin:4,lacticidven:4)  ) Recent Results (from the past 240 hour(s))  Blood Culture (routine x 2)     Status: None (Preliminary result)   Collection Time: 02/04/2018  7:50 AM  Result Value Ref Range Status   Specimen Description   Final    BLOOD BLOOD RIGHT FOREARM Performed at [facility] [address], [place], [place] [address]    Special Requests   Final    BOTTLES DRAWN AEROBIC AND ANAEROBIC Blood Culture adequate volume Performed at [facility] [address], [place], [place] [address]    Culture   Final    NO GROWTH 1 DAY Performed at [facility], [place] [address] [place], [place] [address]    Report Status PENDING  Incomplete  Blood Culture (routine x 2)     Status: None (Preliminary result)   Collection Time: 02/14/2018  7:50 AM  Result Value Ref Range Status   Specimen Description   Final    BLOOD BLOOD LEFT FOREARM Performed at [facility] [address], [place], [place] [address]    Special Requests   Final    BOTTLES DRAWN AEROBIC AND ANAEROBIC Blood Culture adequate volume Performed at [facility] [address], [place], [place] [address]     Culture   Final    NO GROWTH 1 DAY Performed at [facility], [place] [address] [place], [place] [address]    Report Status PENDING  Incomplete  MRSA PCR Screening     Status: None   Collection Time: 02/12/2018  2:19 PM  Result Value Ref Range Status   MRSA by PCR NEGATIVE NEGATIVE Final    Comment:        The GeneXpert MRSA Assay (FDA approved for NASAL specimens only), is one component of a comprehensive MRSA colonization surveillance program. It is not intended to diagnose MRSA infection nor to guide or monitor treatment for MRSA infections. Performed at [facility], [place] [address] [place],  [address]          Radiology Studies: Ct Head Wo Contrast  Result Date: 02/25/2018 CLINICAL DATA:  Weakness. EXAM: CT HEAD WITHOUT CONTRAST TECHNIQUE: Contiguous axial images were obtained from the base of the skull through the vertex without intravenous contrast. COMPARISON:  MRI 01/21/2018 FINDINGS: Brain: No acute intracranial abnormality. Specifically, no hemorrhage, hydrocephalus, mass lesion, acute infarction, or significant intracranial injury. Vascular: No hyperdense vessel or unexpected calcification. Skull: No acute calvarial abnormality. Sinuses/Orbits: Mucosal thickening in the paranasal sinuses. No air-fluid levels. Other: None IMPRESSION: No acute intracranial abnormality. Chronic sinusitis. Electronically Signed   By: Rolm Baptise M.D.   On: 02/09/2018 09:26   US Abdomen Limited Ruq  Result Date: 01/29/2018 CLINICAL DATA:  Elevated LFTs EXAM: ULTRASOUND ABDOMEN LIMITED RIGHT UPPER QUADRANT COMPARISON:  CT 01/21/2018 FINDINGS: Gallbladder: Multiple gallstones within lumen gallbladder. These are well depicted on CT 01/21/2018. Mild gallbladder wall thickening to 4 mm. No gallbladder distention or pericholecystic fluid. Negative sonographic Murphy's sign. Common bile duct: Diameter: Normal at 3 mm Liver: Liver has a nodular contour. No ascites. Portal  vein is patent on color Doppler imaging with normal direction of blood flow towards the liver. IMPRESSION: 1. Cholelithiasis without evidence of acute cholecystitis. Gallbladder wall thickening could indicate chronic cholecystitis. 2. Nodule liver suggests cirrhosis. No focal lesion or biliary duct dilatation. No ascites. Electronically Signed   By: Suzy Bouchard M.D.   On: 01/29/2018 08:00        Scheduled Meds: . folic acid  1  mg Intravenous Daily  . mouth rinse  15 mL Mouth Rinse BID  .  morphine injection  2 mg Intravenous Q6H  . thiamine injection  100 mg Intravenous Daily  . umeclidinium bromide  1 puff Inhalation Daily   Continuous Infusions: . sodium chloride 75 mL/hr at 01/30/18 0554  . ampicillin-sulbactam (UNASYN) IV Stopped (01/30/18 0602)  . dexmedetomidine (PRECEDEX) IV infusion 1.1 mcg/kg/hr (01/30/18 1683)     LOS: 2 days    The patient is critically ill with multiple organ systems failure and requires high complexity decision making for assessment and support, frequent evaluation and titration of therapies, application of advanced monitoring technologies and extensive interpretation of multiple databases.  Critical care time - 45 mins.       Lelon Frohlich, MD [facility] Pager [phone]  If 7PM-7AM, please contact night-coverage [email or web address] Password [email or web address] 01/30/2018, 9:22 AM

## 2018-01-30 NOTE — Progress Notes (Signed)
PMT no charge note  Call received and discussed with sister HCPOA [name]. She states that the patient is now restless and agitated, she is asking about Morphine drip.   Discussed with bedside RN also, she also believes that the patient would benefit from continuous opioids.   Morphine initiated at 2 mg/hour, to increase 10 mg/hour max.   Loistine Chance MD [facility] health palliative medicine team [phone]

## 2018-01-30 NOTE — Progress Notes (Signed)
Family decided to make pt. Comfort care today.

## 2018-02-01 ENCOUNTER — Ambulatory Visit: Payer: Self-pay

## 2018-02-01 ENCOUNTER — Ambulatory Visit: Admission: RE | Admit: 2018-02-01 | Payer: Self-pay | Source: Ambulatory Visit | Admitting: Radiation Oncology

## 2018-02-02 LAB — CULTURE, BLOOD (ROUTINE X 2)
Culture: NO GROWTH
Culture: NO GROWTH
SPECIAL REQUESTS: ADEQUATE
Special Requests: ADEQUATE

## 2018-02-07 NOTE — Telephone Encounter (Signed)
Michael Mcfarland - Michael Mcfarland has passed away.

## 2018-02-12 ENCOUNTER — Other Ambulatory Visit: Payer: Self-pay | Admitting: Family Medicine

## 2018-02-27 NOTE — Progress Notes (Signed)
Patient pronounced dead at 38 by Farley Ly, RN and Polly Cobia, RN. No breath sounds or heart sounds noted. Patient on morphine drip at time of death. MD Domingo Mend made aware/ death certificate signed. Family at bedside at time of death, chaplain paged for family. Will continue to provide support to family. [place] [facility] notified, patient is not a candidate for donation.

## 2018-02-27 NOTE — Progress Notes (Signed)
error 

## 2018-02-27 NOTE — Progress Notes (Signed)
PMT no charge note  Patient was pronounced dead at 0934 on 02/25/18.   I met with the patient's son, sister Drema Pry agent, patient's niece and several other friends and family members in the patient's room.   I offered my condolences, patient's sister Mariann Laster is thankful for the care the patient received in the step down unit here.   Appropriate paperwork to be duly completed, family in the process of selecting funeral home.   Loistine Chance MD [facility] health palliative medicine team [phone]

## 2018-02-27 NOTE — Progress Notes (Signed)
Brief note, full note to follow.  Patient seen and examined, multiple family members at bedside including his Sister Mariann Laster and Dairl Ponder.  Patient was started on morphine drip yesterday for comfort care measures.  I do anticipate a hospital death today.  We will continue to follow closely.  Domingo Mend, MD [facility] Pager: [phone]

## 2018-02-27 NOTE — Progress Notes (Addendum)
Wasted 4ml of morphine in stericycle with Garth Schlatter RN at 1000.

## 2018-02-27 NOTE — Discharge Summary (Signed)
Death summary  Patient was a 52 year old man initially admitted to the hospital on 02/06/2018 due to being found on the floor at home and generalized weakness.  His past medical history significant for alcohol abuse with alcoholic cirrhosis with esophageal varices, metastatic non-small cell lung cancer, tobacco abuse and COPD.  Was found to have aspiration pneumonia and was started on antibiotics, he started going through withdrawals and became restless and agitated as well as hypertensive and required initiation of a Precedex drip.  Due to his advanced cancer diagnosis, palliative care was involved, patient's healthcare power of attorney, his sister, subsequently made him DNR and full comfort care.  Morphine drip was started in the afternoon of 11/3.  Patient subsequently expired on February 18, 2018 at 9:34 AM.  Causes of death -Aspiration pneumonia -Metastatic non-small cell lung cancer -Vocal cord paralysis due to metastatic non-small cell lung cancer -Alcoholic cirrhosis with esophageal varices -Alcohol abuse -Tobacco abuse  Domingo Mend, MD [facility] Pager: [phone]

## 2018-02-27 DEATH — deceased

## 2018-09-15 IMAGING — US US SOFT TISSUE HEAD/NECK
1 series · 14 of 25 positions shown · non-contrast
Comparison: Cervical spine CT 05/20/2017

CLINICAL DATA: Left cervical lymphadenopathy for 3 weeks.

EXAM:
ULTRASOUND OF HEAD/NECK SOFT TISSUES
TECHNIQUE: Ultrasound examination of the head and neck soft tissues was
performed in the area of clinical concern.

[Series 1: us soft tissue head/neck · 0.05mm/px · 14 of 44 slices shown]
[im 1/44]
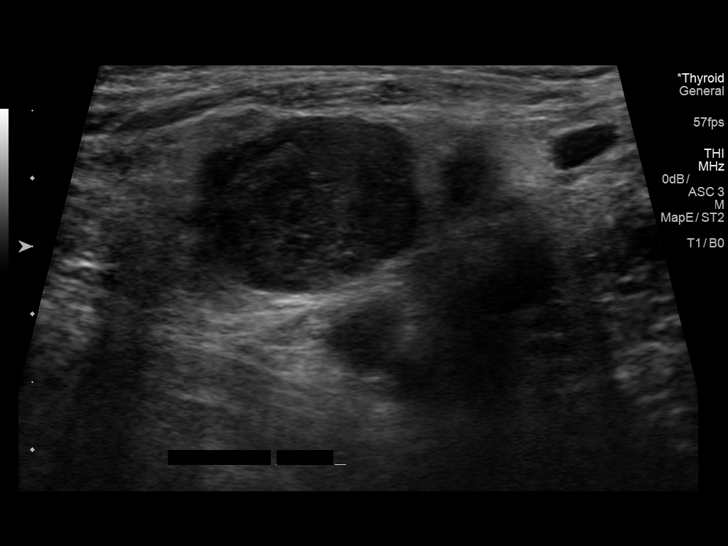
[im 4/44]
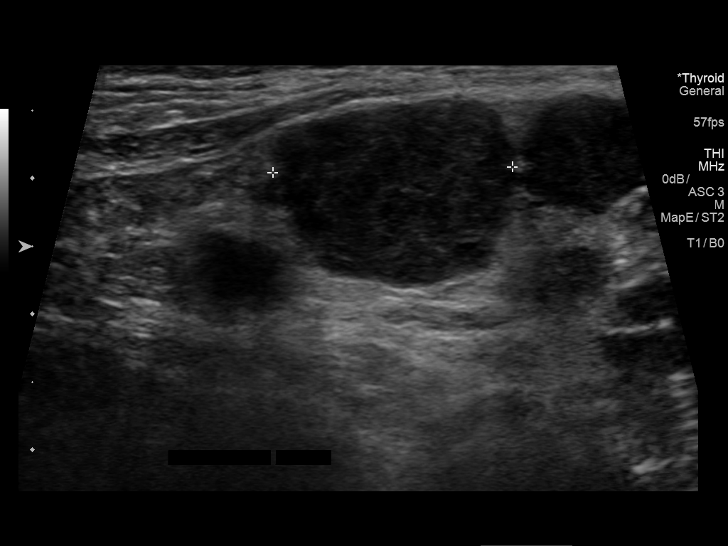
[im 8/44]
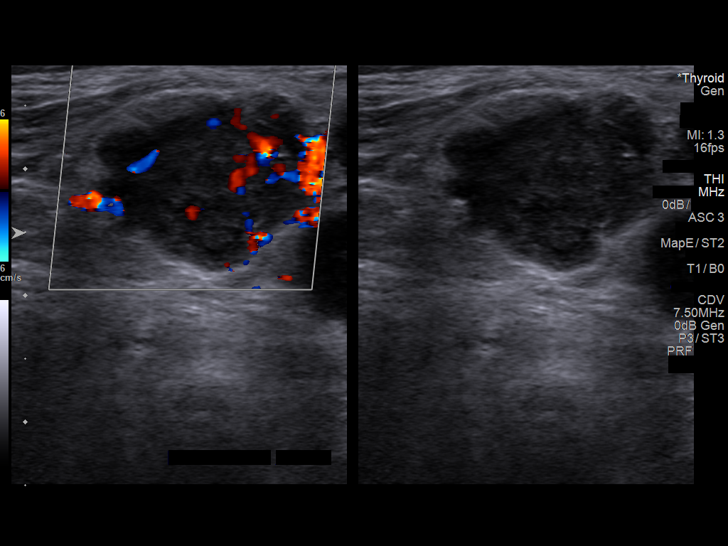
[im 11/44]
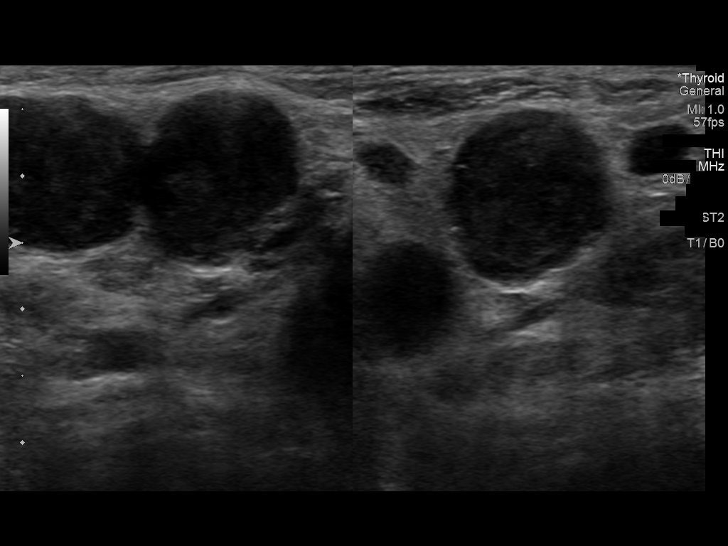
[im 15/44]
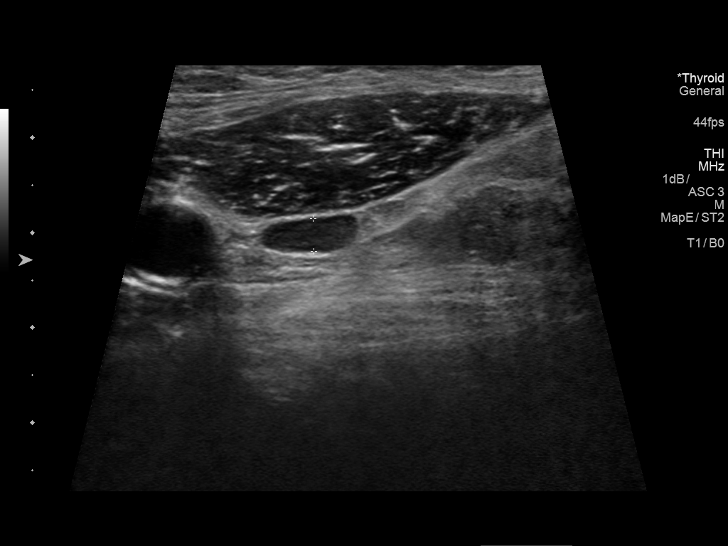
[im 17/44]
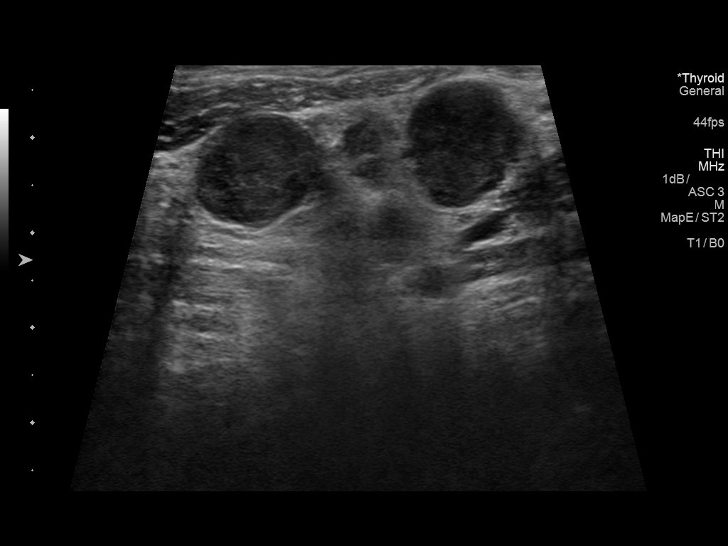
[im 20/44]
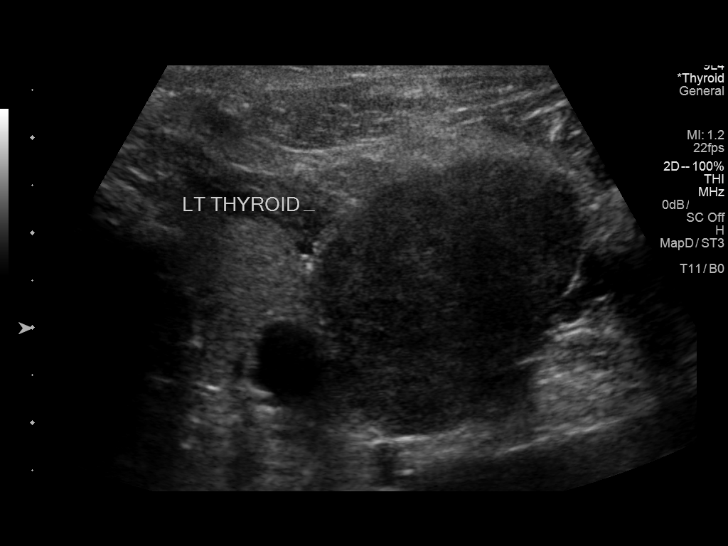
[im 24/44]
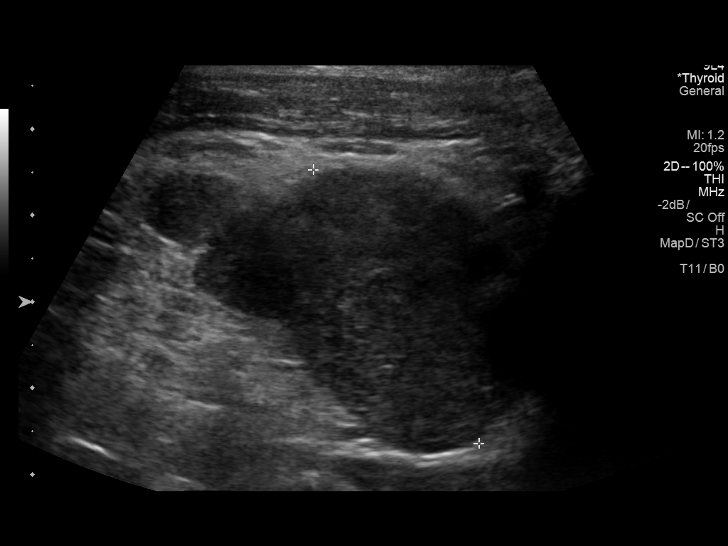
[im 27/44]
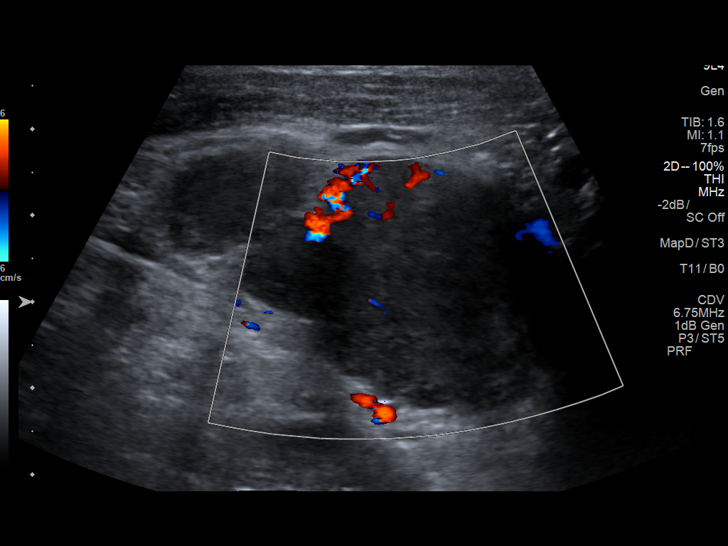
[im 29/44]
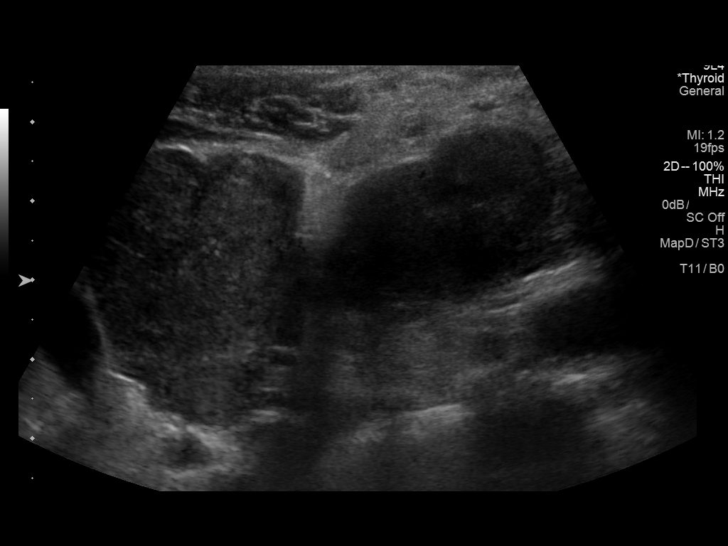
[im 33/44]
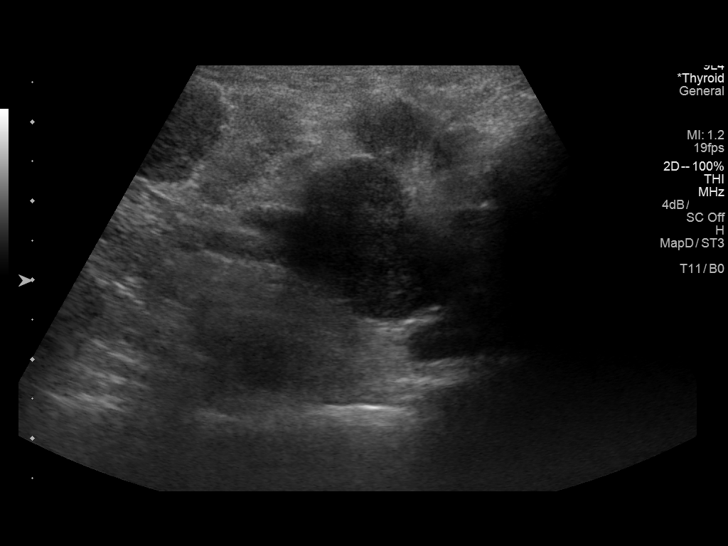
[im 36/44]
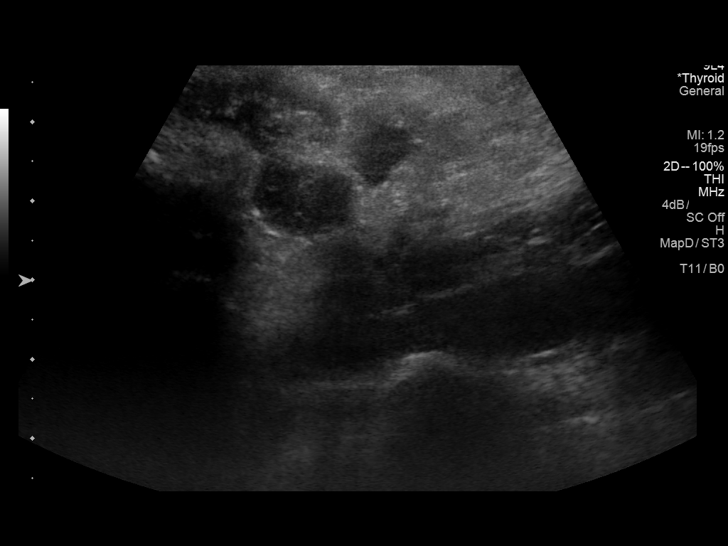
[im 40/44]
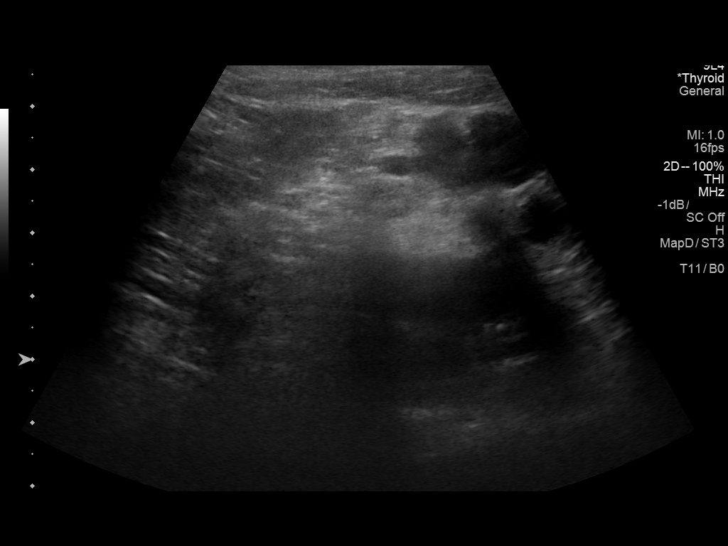
[im 44/44]
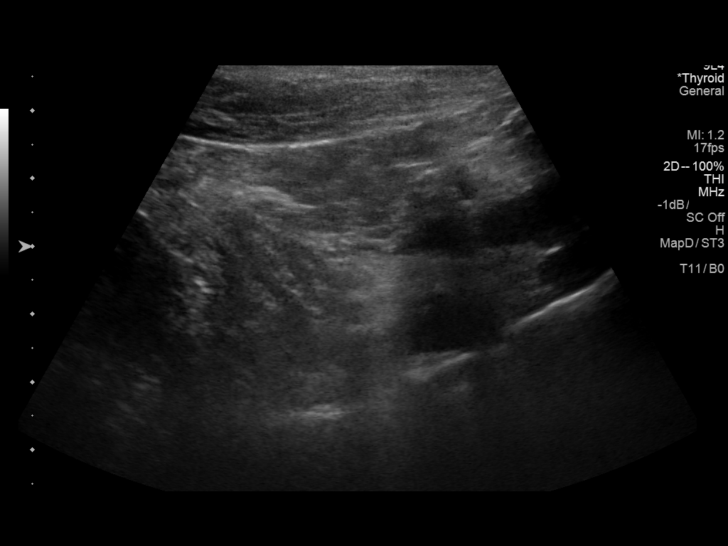

[14 of 25 positions shown; findings below may reference images not displayed]

FINDINGS: There are multiple hypoechoic soft tissue masses in the left neck
both superior and inferior to the level of the thyroid gland
suggestive of enlarged lymph nodes with abnormal morphology. The
more superiorly located node measures 1.6 x 1.4 x 1.8 cm. The
largest of the more inferior nodes measures 2.8 x 2.9 x 3.7 cm. A
1.2 cm node is noted in the right neck without a normal echogenic
hilum evident.
IMPRESSION: Left greater than right cervical lymphadenopathy. Neck CT is
recommended for further evaluation.

## 2020-01-12 IMAGING — CT CT HEAD W/O CM
3 series · 16 of 47 positions shown, 19 images · non-contrast
Comparison: 05/20/2017

CLINICAL DATA: Altered level of consciousness, seizures, history of
ethanol abuse, emphysema, GERD, stroke, smoker

EXAM:
CT HEAD WITHOUT CONTRAST
TECHNIQUE: Contiguous axial images were obtained from the base of the skull
through the vertex without intravenous contrast.

[Series 3: head 5.0 h30s · axial · 0.40mm/px · z∈[-257,-122]mm · 10 of 33 slices shown, 13 images]
[im 3/33  brain]
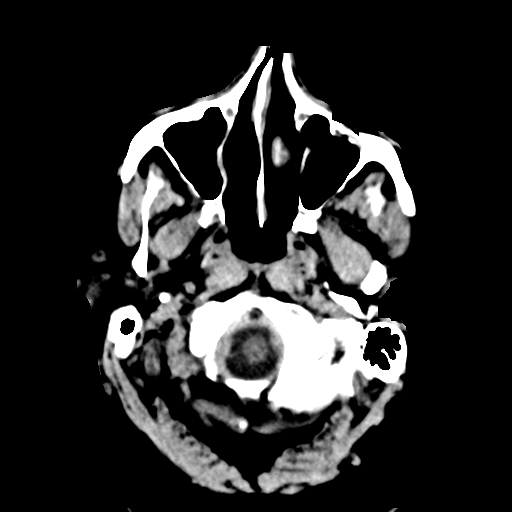
[im 3/33  bone]
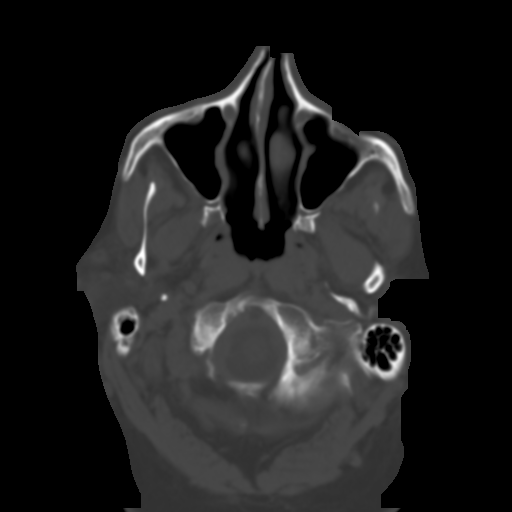
[im 6/33  brain]
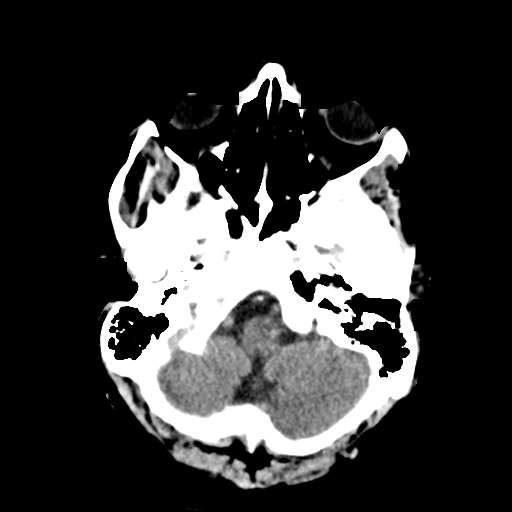
[im 9/33  brain]
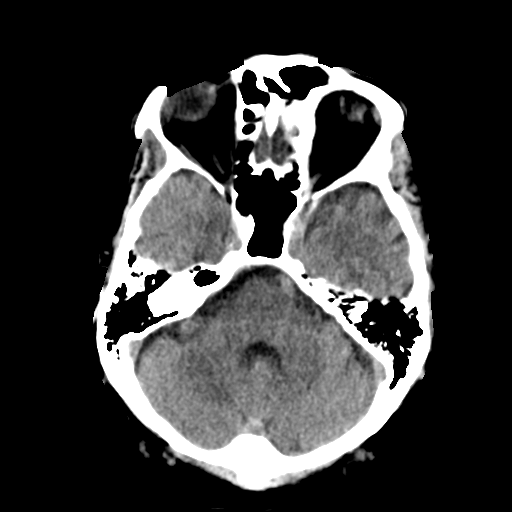
[im 12/33  brain]
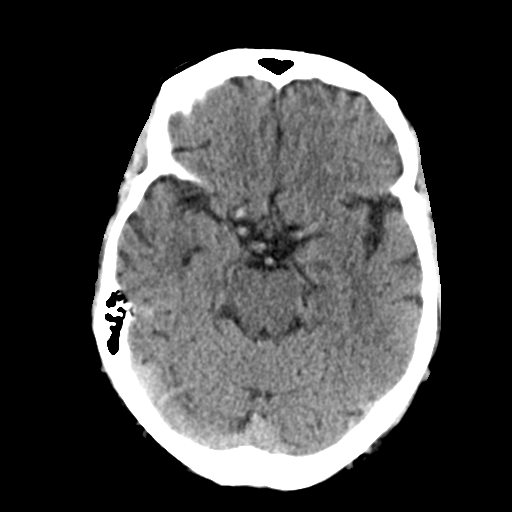
[im 15/33  brain]
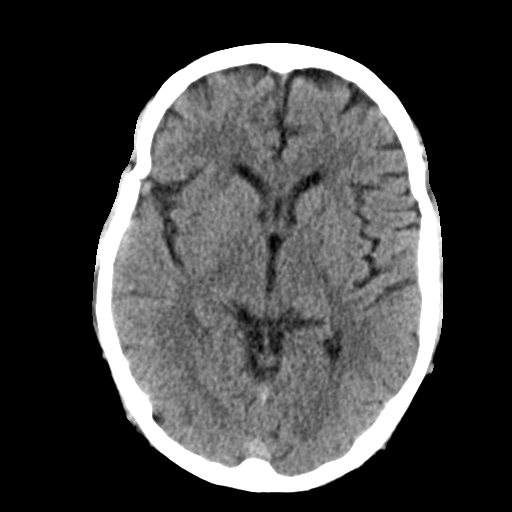
[im 15/33  bone]
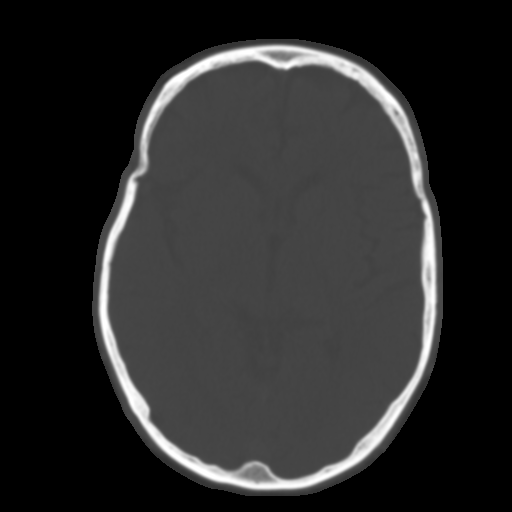
[im 18/33  brain]
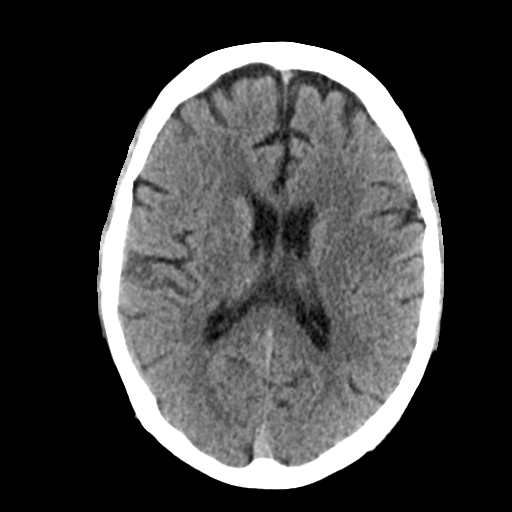
[im 21/33  brain]
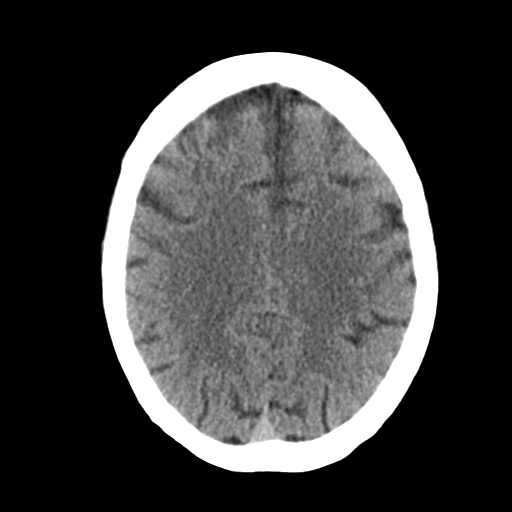
[im 25/33  brain]
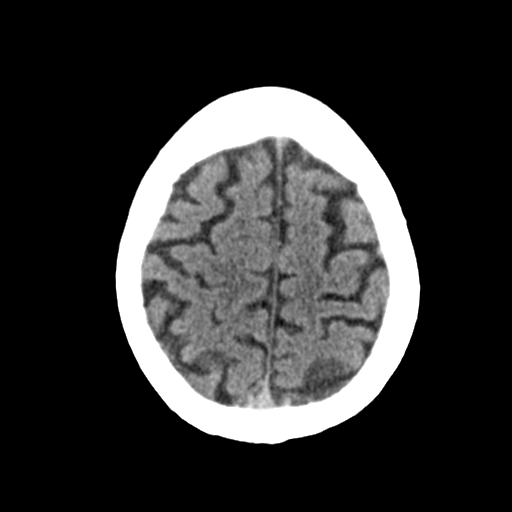
[im 27/33  brain]
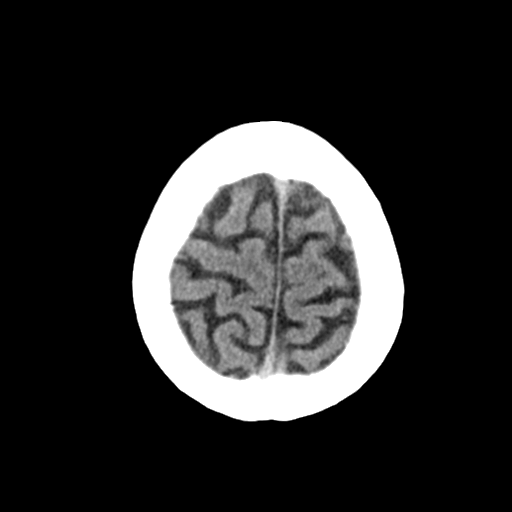
[im 27/33  bone]
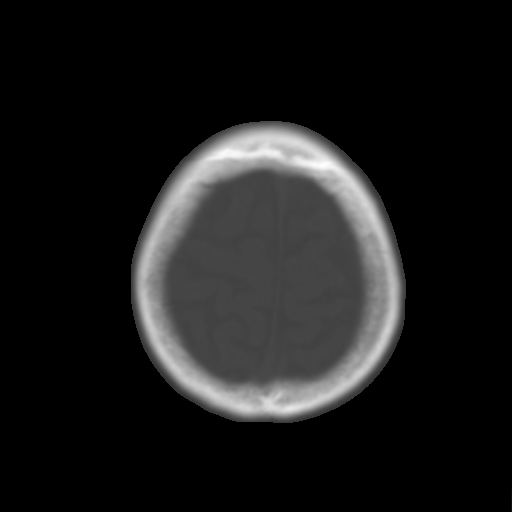
[im 30/33  brain]
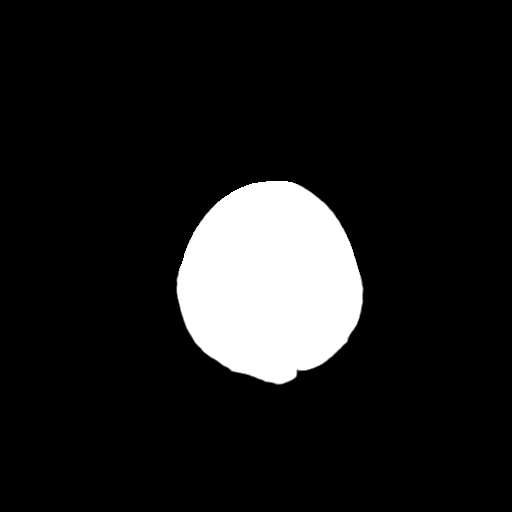

[Series 5: head 3.0 mpr cor · coronal · 0.32mm/px · 3 of 67 slices shown]
[im 23/67  brain]
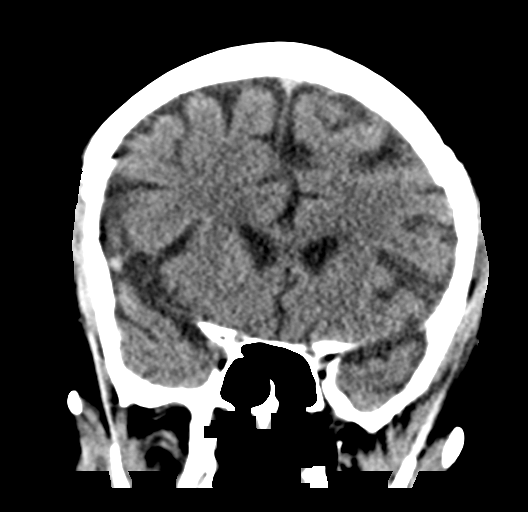
[im 30/67  brain]
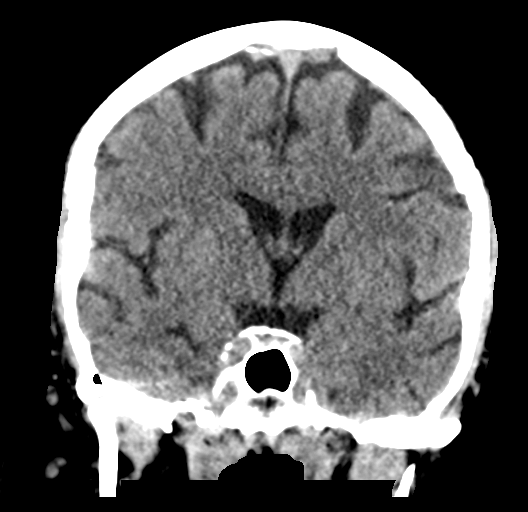
[im 37/67  brain]
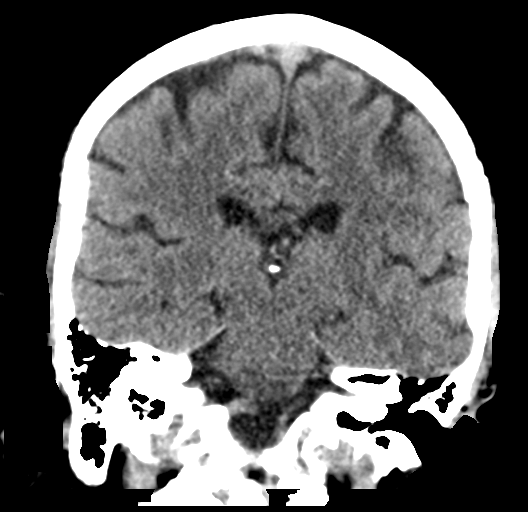

[Series 6: head 3.0 mpr sag · sagittal · 0.32mm/px · 3 of 54 slices shown]
[im 18/54  brain]
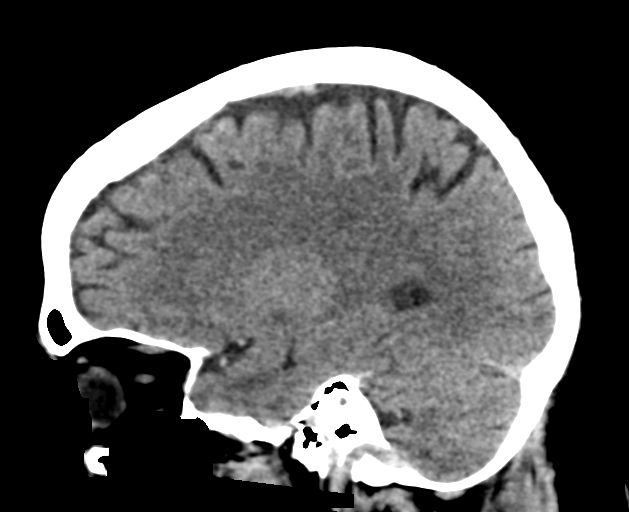
[im 27/54  brain]
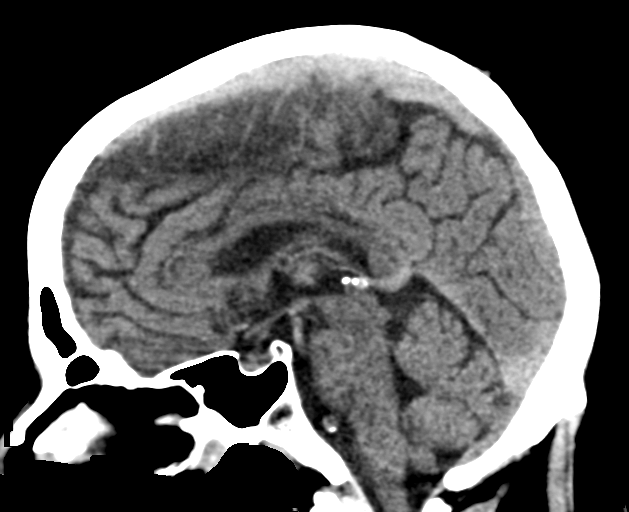
[im 36/54  brain]
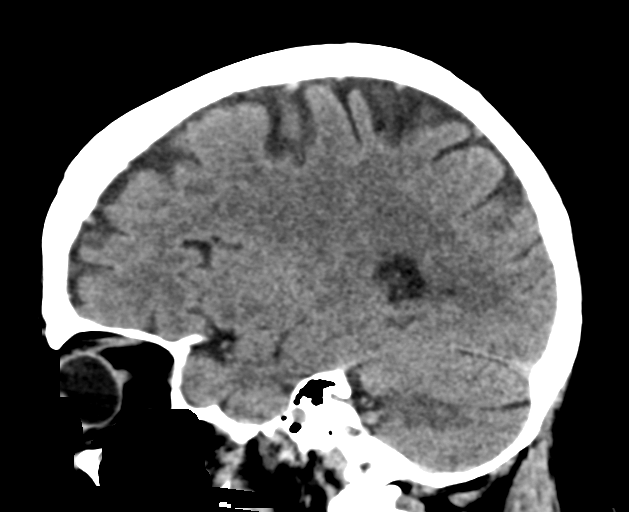

[16 of 47 positions shown; findings below may reference images not displayed]

FINDINGS: Brain: Normal ventricular morphology. No midline shift or mass
effect. Normal appearance of brain parenchyma. No intracranial
hemorrhage, mass lesion, evidence of acute infarction, or
extra-axial fluid collection.

Vascular: No hyperdense vessels

Skull: Intact

Sinuses/Orbits: Clear

Other: N/A
IMPRESSION: Normal exam.
